# Patient Record
Sex: Male | Born: 1985 | Race: Black or African American | Hispanic: No | Marital: Single | Smoking: Current every day smoker
Health system: Southern US, Community
[De-identification: ages and names within clinical notes are randomized; demographics above are authoritative.]

## PROBLEM LIST (undated history)

## (undated) DIAGNOSIS — F329 Major depressive disorder, single episode, unspecified: Secondary | ICD-10-CM

## (undated) DIAGNOSIS — F32A Depression, unspecified: Secondary | ICD-10-CM

## (undated) HISTORY — PX: JOINT REPLACEMENT: SHX530

---
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
## 2006-12-02 ENCOUNTER — Emergency Department (HOSPITAL_COMMUNITY): Admission: EM | Admit: 2006-12-02 | Discharge: 2006-12-02 | Payer: Self-pay | Admitting: Emergency Medicine

## 2008-04-02 ENCOUNTER — Emergency Department (HOSPITAL_COMMUNITY): Admission: EM | Admit: 2008-04-02 | Discharge: 2008-04-02 | Payer: Self-pay | Admitting: Emergency Medicine

## 2008-04-06 ENCOUNTER — Emergency Department (HOSPITAL_COMMUNITY): Admission: EM | Admit: 2008-04-06 | Discharge: 2008-04-06 | Payer: Self-pay | Admitting: Emergency Medicine

## 2009-03-18 ENCOUNTER — Emergency Department (HOSPITAL_COMMUNITY): Admission: EM | Admit: 2009-03-18 | Discharge: 2009-03-18 | Payer: Self-pay | Admitting: Emergency Medicine

## 2010-05-20 ENCOUNTER — Emergency Department (HOSPITAL_COMMUNITY): Admission: EM | Admit: 2010-05-20 | Discharge: 2010-05-20 | Payer: Self-pay | Admitting: Emergency Medicine

## 2010-06-08 ENCOUNTER — Emergency Department (HOSPITAL_COMMUNITY): Admission: EM | Admit: 2010-06-08 | Discharge: 2010-06-08 | Payer: Self-pay | Admitting: Emergency Medicine

## 2010-09-24 LAB — URINALYSIS, ROUTINE W REFLEX MICROSCOPIC
Glucose, UA: NEGATIVE mg/dL
Hgb urine dipstick: NEGATIVE
Specific Gravity, Urine: 1.026 (ref 1.005–1.030)

## 2010-09-24 LAB — BASIC METABOLIC PANEL
CO2: 24 mEq/L (ref 19–32)
Calcium: 9 mg/dL (ref 8.4–10.5)
GFR calc Af Amer: >60 mL/min (ref >60)
GFR calc non Af Amer: >60 mL/min (ref >60)
Sodium: 136 mEq/L (ref 135–145)

## 2010-09-24 LAB — RAPID URINE DRUG SCREEN, HOSP PERFORMED
Amphetamines: NOT DETECTED
Barbiturates: NOT DETECTED
Benzodiazepines: NOT DETECTED
Opiates: NOT DETECTED

## 2010-09-24 LAB — DIFFERENTIAL
Lymphocytes Relative: 48 % — ABNORMAL HIGH (ref 12–46)
Lymphs Abs: 3.6 10*3/uL (ref 0.7–4.0)
Monocytes Relative: 10 % (ref 3–12)
Neutro Abs: 3 10*3/uL (ref 1.7–7.7)
Neutrophils Relative %: 39 % — ABNORMAL LOW (ref 43–77)

## 2010-09-24 LAB — CBC
Hemoglobin: 13.8 g/dL (ref 13.0–17.0)
RBC: 4.56 MIL/uL (ref 4.22–5.81)
WBC: 7.6 10*3/uL (ref 4.0–10.5)

## 2010-09-24 LAB — ETHANOL: Alcohol, Ethyl (B): <5 mg/dL (ref 0–10)

## 2010-10-18 LAB — CBC
HCT: 40.6 % (ref 39.0–52.0)
MCHC: 34.5 g/dL (ref 30.0–36.0)
MCV: 94.2 fL (ref 78.0–100.0)
Platelets: 282 10*3/uL (ref 150–400)
RBC: 4.31 MIL/uL (ref 4.22–5.81)

## 2010-10-18 LAB — RAPID URINE DRUG SCREEN, HOSP PERFORMED
Amphetamines: NOT DETECTED
Barbiturates: NOT DETECTED
Benzodiazepines: NOT DETECTED
Tetrahydrocannabinol: POSITIVE — AB

## 2010-10-18 LAB — DIFFERENTIAL
Basophils Relative: 3 % — ABNORMAL HIGH (ref 0–1)
Eosinophils Absolute: 0.2 10*3/uL (ref 0.0–0.7)
Eosinophils Relative: 3 % (ref 0–5)
Lymphs Abs: 4.3 10*3/uL — ABNORMAL HIGH (ref 0.7–4.0)
Monocytes Relative: 6 % (ref 3–12)
Neutrophils Relative %: 45 % (ref 43–77)

## 2010-10-18 LAB — POCT I-STAT, CHEM 8
Chloride: 105 mEq/L (ref 96–112)
Glucose, Bld: 96 mg/dL (ref 70–99)
HCT: 44 % (ref 39.0–52.0)
Hemoglobin: 15 g/dL (ref 13.0–17.0)
Potassium: 3.9 mEq/L (ref 3.5–5.1)
Sodium: 141 mEq/L (ref 135–145)

## 2010-10-18 LAB — URINALYSIS, ROUTINE W REFLEX MICROSCOPIC
Glucose, UA: NEGATIVE mg/dL
Hgb urine dipstick: NEGATIVE
Ketones, ur: NEGATIVE mg/dL
Protein, ur: NEGATIVE mg/dL
Urobilinogen, UA: 1 mg/dL (ref 0.0–1.0)

## 2010-10-18 LAB — ETHANOL: Alcohol, Ethyl (B): <5 mg/dL (ref 0–10)
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
## 2010-11-29 NOTE — Discharge Summary (Signed)
[place]. [facility]  Patient:    Frederick Ballard, Frederick Ballard Visit Number: [id number] MRN: [id number]          Service Type: MED Location: PEDS 6154 01 Attending Physician:  Lubertha South Dictated by:   Dorie Rank, P.A.-C. Admit Date:  08/16/2001 Discharge Date: 08/18/2001                             Discharge Summary  ADMITTING DIAGNOSIS:  Left type 3 displaced anterior tibial tubercle fracture.  DISCHARGE DIAGNOSIS:  Left type 3 displaced anterior tibial tubercle fracture.  PROCEDURES:  On August 16, 2001, the patient underwent an open reduction and internal fixation of the left anterior tibial tubercle fracture with two 4.9 cannulated screws.  Surgeon was Dr. Kerrin Champagne.  Anesthesia was general. Complications none.  CONSULTANTS:  None.  HISTORY OF PRESENT ILLNESS:  This is a 25 year old male who sustained an injury to his left knee while going up to shoot a basket and felt a pop in the knee.  He had immediate discomfort and pain.  He was seen in the emergency room where radiographs demonstrated a left anterior tibial tubercle fracture, which was displaced and an intra-articular type 3 injury.  The fracture was noted to be displaced greater than 2 cm, almost 3 cm, intra-articularly.  It was felt to represent the need for intraoperative reduction and internal fixation.  HOSPITAL COURSE:  The patient had the above-stated surgery.  He tolerated this well without complication.  While in the operating room, the incision was dressed in a sterile fashion and a Hemovac drain was placed under the incision.  On postoperative day one, the dressing was clean, dry, and intact.  The dressing was changed on postoperative day two and found to be free of any erythema or drainage.  The Hemovac drain was discontinued on postoperative day one without difficulty.  Initially, the patient utilized IV pain medications; however, he was weaned off of this prior to  discharge and he had good pain control with p.o. pain medications.  Appropriate IV antibiotics were used postoperatively for infection control.  The patient was placed on aspirin and utilized TED hose for DVT prophylaxis while in the hospital.  A regular diet was resumed postoperatively.  He had good p.o. intake while in the hospital.  Hemoglobin and hematocrit were checked on postoperative day one and found to be stable.  He was nonweightbearing to the left lower extremity. While in the operating room, an IV was placed.  This was discontinued prior to his discharge home.  Neurovascular, motor, and vital signs were checked routinely postoperatively and also found to be stable.  He went to physical therapy and occupational therapy for progressive ambulation.  He was comfortable utilizing crutches on his nonweightbearing status prior to discharge.  On August 18, 2001, he was felt to be medically and orthopedically stable for discharge.  LABORATORY DATA:  On August 16, 2001, hemoglobin 14.1 and hematocrit 40.9. On August 17, 2001, hemoglobin was 12.5 and hematocrit 36.2.  Radiology:  No reports available on the chart at the time of this dictation. Cardiology:  No EKG.  CONDITION ON DISCHARGE:  Improved.  DISCHARGE INSTRUCTIONS:  The patient was discharged home in the care of his mother, regular diet.  Given prescriptions for Percocet and Robaxin.  He was to take enteric-coated aspirin over-the-counter 1 p.o. q.d.  Daily dressing changes.  Immobilizer on at all times.  He  could shower on postoperative day five.  Follow up with Dr. Kerrin Champagne in 10 to 12 days.  He is to call for an appointment.  Continue nonweightbearing to the left lower extremity.  Call for any questions or concerns prior to his appointment with Dr. Kerrin Champagne. Dictated by:   Dorie Rank, P.A.-C. Attending Physician:  Lubertha South DD:  09/09/01 TD:  09/09/01 Job: [id number]

## 2010-11-29 NOTE — Op Note (Signed)
[name]. [facility]  Patient:    Frederick Ballard, Frederick Ballard Visit Number: [id number] MRN: [id number]          Service Type: MED Location: PEDS 6154 01 Attending Physician:  Lubertha South Dictated by:   Kerrin Champagne, M.D. Proc. Date: 08/16/01 Admit Date:  08/16/2001                             Operative Report  PREOPERATIVE DIAGNOSIS:  Left type 3 displaced anterior tibial tubercle fracture.  POSTOPERATIVE DIAGNOSIS:  Left type 3 displaced anterior tibial tubercle fracture.  PROCEDURE:  Open reduction and internal fixation of left anterior tibial tubercle fracture with two 4.0 cannulated screws.  SURGEON:  Kerrin Champagne, M.D.  ANESTHESIA:  General orotracheal, J. Claybon Jabs, M.D.  ESTIMATED BLOOD LOSS:  150 cc.  DRAINS:  Hemovac x1.  TOURNIQUET TIME:  Total tourniquet time at 300 mmHg, 1 hour 5 minutes.  BRIEF CLINICAL HISTORY:  The patient is a 25 year old male who sustained an injury to his left knee while going up to shoot a basket and felt a pop in the knee.  Immediate discomfort and pain.  Seen in the emergency room, where radiographs demonstrated a left anterior tibial tubercle fracture, which was displaced and an intra-articular type 3 injury.  The fracture has displaced greater than 2 cm, almost 3 cm intra-articularly.  It is felt to represent the need for intraoperative reduction and internal fixation.  INTRAOPERATIVE FINDINGS:  The fracture indeed was intra-articular and represented a rather large anterior tibial tubercle fragment and fracture of the anterior aspect of the proximal tibia.  It was reduced anatomically and fixed with two screws.  DESCRIPTION OF PROCEDURE:  After adequate general anesthesia, the patient transferred to the operating table.  There on the Ione frame, he was in supine position.  Tourniquet about the left upper thigh.  Prepped from the left ankle to the left upper thigh with Duraprep solution,  standard preoperative antibiotics of Ancef.  Draped in the usual manner using an arthroscope drape sheet in case the need for arthroscopy appeared.  The patients leg was elevated, exsanguinated with an Esmarch bandage, and the left lower extremity tourniquet inflated to 300 mmHg.  The incision, a medial parapatellar incision, longitudinal, extending from the midpatellar tendon line distally.  The incision was initially just to the level of the anterior tibial tubercle just below it and then later extended an additional 3-4 cm below the anterior tibial tubercle to allow for repair of soft tissue structures, periosteum, and fascial layers that were torn with the avulsion injury.  Incision carried sharply through skin and subcu layers directly down to the anterior tibial tubercle, along the medial aspect of the patellar tendon, and then taken over the lateral aspect of the patellar tendon, preserving deep flaps.  A large amount of blood was immediately encountered, and this was irrigated.  This represented hematoma from the fracture site in the intra-articular area.  The fracture was opened widely, maintaining large flaps both medial and lateral.  The fracture fragment of the tibial tubercle was reflected superiorly, the intra-articular nature of the fracture identified, irrigation was performed.  Careful inspection of the joint demonstrated no intra-articular fragments or cartilage present.  Irrigation was performed, and careful range of motion demonstrated no fragmentations within the joint.  Carefully the borders of the fracture site were freed up using a 15 blade scalpel 1-2 mm from the margin  to allow for exact anatomic reduction.  The fracture fragment was then carefully reduced after a very light, minimal curettage to remove hematoma, soft tissue from the bony structures of both the proximal tibia and the fragment fragment of the anterior tibial tubercle. With this, then, the  fragment was carefully reduced, pinned in place using a temporary pin anterior inferolateral.  C-arm fluoroscopy used to ascertain reduction of the fragment.  Then the 15 blade scalpel used to incise a small 3-4 mm longitudinal incision into the patellar tendon approximately 1.5-2 cm of the most distal end of the anterior tibial tubercle.  Then a guide pin was placed into the proximal metaphysis with soft tissue sleeve protector. Overdrill using a 2.7 drill was then performed after measuring length of 60 mg, and a 55 mm length screw was chosen.  With this, then, overdrill was performed, then tapping, and then countersink performed and then screw screwed into place.  The final threads were screwed down after removing temporary fixation pin to allow for further compression across the anterior tibial tubercle fracture site, reducing it nicely.  A second guide pin was then passed parallel to the first or nearly so, about 1.5 cm distal to the previous pin.  This was passed parallel, protecting the soft tissue structures and soft tissue sleeve, and then appropriate length measured, measured 46 mm, and after measuring for depth, drilling was performed with a 2.7 drill bit and then tapping performed and a countersink performed.  A 46 mm cancellous screw was then screwed into place and obtained excellent purchase on fragment, as did the more proximal screw.  Permanent C-arm images in AP and lateral planes were obtained at this point.  Further irrigation was then performed, and the periosteum and external retinaculum of the knee were approximated with interrupted 0 Vicryl sutures both medially and laterally.  This required extension of the incision as noted previously to allow for the anterior tibial periosteum flap to be carefully annealed back down to bone anteriorly, medially, and laterally using the 0 Vicryl sutures.  As there was quite a bit of hematoma and flaps both medial and lateral, a  Hemovac drain was placed both medially and laterally using a medium-sized standard size for total joint replacement.  This exited the left lateral aspect of the knee.  Deep subcu  layers were then approximated with interrupted 0 Vicryl sutures and the more superficial layers with interrupted 2-0 Vicryl sutures and the skin closed with stainless steel staples.  Adaptic, 4 x 4s, ABD pads then affixed to the skin with sterile Webril and a well-padded bulky Jones dressing applied from the left ankle to the left upper thigh.  Tourniquet was released, Ace wrap applied from the left foot to the left upper thigh.  Total tourniquet time was 1 hour 5 minutes.  The patient was then returned to his bed following reactivation and extubation.  Returned to the recovery room in satisfactory condition.  All instrument and sponge counts were correct. Dictated by:   Kerrin Champagne, M.D. Attending Physician:  Lubertha South DD:  08/16/01 TD:  08/17/01 Job: [id number]

## 2011-07-22 ENCOUNTER — Other Ambulatory Visit (HOSPITAL_COMMUNITY): Payer: Self-pay

## 2011-07-22 ENCOUNTER — Encounter: Payer: Self-pay | Admitting: Emergency Medicine

## 2011-07-22 ENCOUNTER — Emergency Department (HOSPITAL_COMMUNITY)
Admission: EM | Admit: 2011-07-22 | Discharge: 2011-07-23 | Disposition: A | Discharge status: Other Acute Inpatient Hospital | Payer: Self-pay | Attending: Emergency Medicine | Admitting: Emergency Medicine

## 2011-07-22 DIAGNOSIS — IMO0002 Reserved for concepts with insufficient information to code with codable children: Secondary | ICD-10-CM | POA: Insufficient documentation

## 2011-07-22 DIAGNOSIS — F17200 Nicotine dependence, unspecified, uncomplicated: Secondary | ICD-10-CM | POA: Insufficient documentation

## 2011-07-22 DIAGNOSIS — R45851 Suicidal ideations: Secondary | ICD-10-CM | POA: Insufficient documentation

## 2011-07-22 DIAGNOSIS — R443 Hallucinations, unspecified: Secondary | ICD-10-CM | POA: Insufficient documentation

## 2011-07-22 DIAGNOSIS — F411 Generalized anxiety disorder: Secondary | ICD-10-CM | POA: Insufficient documentation

## 2011-07-22 HISTORY — DX: Depression, unspecified: F32.A

## 2011-07-22 HISTORY — DX: Major depressive disorder, single episode, unspecified: F32.9

## 2011-07-22 LAB — COMPREHENSIVE METABOLIC PANEL
ALT: 22 U/L (ref 0–53)
AST: 21 U/L (ref 0–37)
Alkaline Phosphatase: 99 U/L (ref 39–117)
CO2: 21 mEq/L (ref 19–32)
Calcium: 9.6 mg/dL (ref 8.4–10.5)
GFR calc non Af Amer: >90 mL/min (ref >90)
Glucose, Bld: 101 mg/dL — ABNORMAL HIGH (ref 70–99)
Potassium: 3.5 mEq/L (ref 3.5–5.1)
Sodium: 139 mEq/L (ref 135–145)

## 2011-07-22 LAB — CBC
Hemoglobin: 14.6 g/dL (ref 13.0–17.0)
MCH: 29.8 pg (ref 26.0–34.0)
Platelets: 289 10*3/uL (ref 150–400)
RBC: 4.9 MIL/uL (ref 4.22–5.81)
WBC: 9.1 10*3/uL (ref 4.0–10.5)

## 2011-07-22 LAB — RAPID URINE DRUG SCREEN, HOSP PERFORMED
Barbiturates: NOT DETECTED
Benzodiazepines: NOT DETECTED
Cocaine: POSITIVE — AB
Tetrahydrocannabinol: POSITIVE — AB

## 2011-07-22 NOTE — ED Notes (Signed)
Family at bedside. 

## 2011-07-22 NOTE — ED Provider Notes (Signed)
Medical screening examination/treatment/procedure(s) were performed by non-physician practitioner and as supervising physician I was immediately available for consultation/collaboration.  Raeford Razor, MD 07/22/11 [phone]

## 2011-07-22 NOTE — ED Notes (Signed)
Pt. Alert and oriented. Eating dinner and visiting with a relative.  No suicidal ideation or H/I.  Calm and cooperative.

## 2011-07-22 NOTE — ED Notes (Signed)
Sitter at bedside.

## 2011-07-22 NOTE — ED Notes (Signed)
ALP bedside to speak with pt 

## 2011-07-22 NOTE — ED Notes (Signed)
Pt placed into blue scrubs with instructions, [employer] and sitter remains bedside, security notified

## 2011-07-22 NOTE — ED Provider Notes (Signed)
History     CSN: [id number]  Arrival date & time 07/22/11  0416   None     Chief Complaint  Patient presents with  . Suicidal    (Consider location/radiation/quality/duration/timing/severity/associated sxs/prior treatment) HPI Comments: Patient with pressured speech, random thought, tangential reasoning or insight [employer] for evaluation States he has had suicide attempts in the past by using a belt, trying to hang himself and refusing to eat last episode was about 3 weeks ago  The history is provided by the patient.    History reviewed. No pertinent past medical history.  Past Surgical History  Procedure Date  . Joint replacement     No family history on file.  History  Substance Use Topics  . Smoking status: Current Everyday Smoker -- 1.0 packs/day    Types: Cigarettes  . Smokeless tobacco: Not on file  . Alcohol Use: Yes      Review of Systems  Constitutional: Negative.   HENT: Negative.   Eyes: Negative.   Respiratory: Negative for cough and shortness of breath.   Cardiovascular: Negative.   Gastrointestinal: Negative for nausea and vomiting.  Musculoskeletal: Negative.   Skin: Negative.   Neurological: Negative for dizziness, speech difficulty and weakness.  Hematological: Negative.   Psychiatric/Behavioral: Positive for suicidal ideas and hallucinations. Negative for confusion, self-injury and agitation. The patient is nervous/anxious. The patient is not hyperactive.     Allergies  Invega  Home Medications   Current Outpatient Rx  Name Route Sig Dispense Refill  . QUETIAPINE FUMARATE 300 MG PO TABS Oral Take 300 mg by mouth 2 (two) times daily.      . SERTRALINE HCL 100 MG PO TABS Oral Take 100 mg by mouth daily.        BP 144/87  Pulse 88  Temp 98 F (36.7 C)  Resp 16  Wt 160 lb (72.576 kg)  SpO2 99%  Physical Exam  Constitutional: He appears well-developed and well-nourished.  HENT:  Head: Normocephalic.  Cardiovascular: Normal rate.     Pulmonary/Chest: Effort normal.  Abdominal: Soft.  Musculoskeletal: Normal range of motion.  Neurological: He is alert.  Skin: Skin is warm.  Psychiatric: His mood appears anxious. His speech is rapid and/or pressured and tangential. He is agitated and actively hallucinating. He is not aggressive. He expresses inappropriate judgment. He expresses suicidal ideation. He expresses suicidal plans.    ED Course  Procedures (including critical care time)   Labs Reviewed  CBC  COMPREHENSIVE METABOLIC PANEL  ETHANOL  URINE RAPID DRUG SCREEN (HOSP PERFORMED)   No results found.   No diagnosis found.  Patient, states he's not been taking his medications for 2-3 weeks  MDM  Suicidal        Arman Filter, NP 07/22/11 0532

## 2011-07-22 NOTE — ED Notes (Signed)
Pt wanting to know when he would be discharged. Pt informed he was waiting to see the ACT team and they would determine a treatment plan for him. Also informed he would be moved to TCU to wait. Pt verbalizes understanding and seems agreeable at this time. Delorise Shiner, NT sitter at bedside. Pt denies additional needs at this time.

## 2011-07-22 NOTE — ED Notes (Signed)
Pt alert, presents with [employer], c/o Suicidal thought, denies plan, denies HI, pt resp even unlabored, skin pwd,

## 2011-07-22 NOTE — BH Assessment (Signed)
Assessment Note   Frederick Ballard is an 25 y.o. male. Pt presents to [facility] with [employer]. EDP notes that atient presented with pressured speech, random thought, &  tangential reasoning or insight. During the assessment patient sts that he is suicidal. He has a plan to drink iodine. Pt sts, "I am a smart person and I know how to hurt myself". He explains that not going to work and getting fired would be the same as a suicide attempt because he would be killing his "livelyhood".  States he has had suicide attempts in the past by using a belt, trying to hang himself. He also refused to eat approximately 3 weeks ago hoping he would die due to lack of food. Patient is unable to contract for safety.Patient has a long history of depression and feels that a "dark cloud is constantly hanging over his head".  Patient denies HI. He however reports calling the mother of child often and threatening to harm her due to conflict.   Patient denies AVH, however during the assessment he appears paranoid. He looks around the room and makes no eye contact as if he is responding to external stimuli.   Patient has a noted history of Cocaine use. He denies current use. He reports THC and alcohol use on the "weekends". His last drink was prior to arrival and he reports drinking (2) 40oz beers.  Patient denies history of in-patient treatment. He does admit to a history of out-patient treatment with the psychiatrist at [facility]. He has also utilized their crises walk-in services and mobile crises in the past when needed. He has a upcoming appointment with a psychiatrist in [facility] 08/07/2011 to  Be evaluated for disability.     Axis I: Major Depression, Recurrent severe, Axis II: Deferred Axis III:  Past Medical History  Diagnosis Date  . Depression    Axis IV: economic problems, housing problems, occupational problems, other psychosocial or environmental problems, problems related to social environment, problems with  access to health care services and problems with primary support group Axis V: 31-40 impairment in reality testing  Past Medical History:  Past Medical History  Diagnosis Date  . Depression     Past Surgical History  Procedure Date  . Joint replacement     Family History: No family history on file.  Social History:  reports that he has been smoking Cigarettes.  He has been smoking about 1 pack per day. He does not have any smokeless tobacco history on file. He reports that he drinks alcohol. He reports that he uses illicit drugs (Cocaine and Marijuana).  Additional Social History:  Alcohol / Drug Use Pain Medications: patient denies  Prescriptions: Patient denies current prescription meds. He reports takings Zoloft and Seroquel in the past-last 6 months. Over the Counter: patient denies History of alcohol / drug use?: Yes (pt admits to current THC and alcohol;noted hx of cocaine use) Longest period of sobriety (when/how long): unknown Substance #1 Name of Substance 1: Alcohol (beer and liqour) 1 - Age of First Use: Pt sts, "I don't remember" 1 - Amount (size/oz): 2 beers 1 - Frequency: weekends/"social" 1 - Duration: "several yrs" 1 - Last Use / Amount: before arriaval to [facility] 07/21/2010 Substance #2 Name of Substance 2: THC 2 - Age of First Use: 15 2 - Amount (size/oz): varies 2 - Frequency: daily 2 - Duration: on-going since age 49 2 - Last Use / Amount: "over the past weekend" Substance #3 Name of Substance 3:  Pt has a noted hx of Cocaine use, however; did not admit to use during the assessment 3 - Age of First Use: n/a 3 - Amount (size/oz): n/a 3 - Frequency: n/a 3 - Duration: n/a 3 - Last Use / Amount: n/a Allergies:  Allergies  Allergen Reactions  . Invega (Paliperidone)     Home Medications:  No current facility-administered medications on file as of 07/22/2011.   No current outpatient prescriptions on file as of 07/22/2011.    OB/GYN Status:  No LMP for male  patient.  General Assessment Data Location of Assessment: [facility] ED ACT Assessment:  (No) Living Arrangements: Parent;Other (Comment) (lives with mother) Can pt return to current living arrangement?: Yes Admission Status: Voluntary Is patient capable of signing voluntary admission?: Yes Transfer from: Home (transfer from home; sts he was picked up by GPD ) Referral Source: Self/Family/Friend  Education Status Is patient currently in school?: No  Risk to self Suicidal Ideation: Yes-Currently Present Suicidal Intent: Yes-Currently Present Is patient at risk for suicide?: Yes Suicidal Plan?: Yes-Currently Present Specify Current Suicidal Plan:  ("I will drink iodine") Access to Means: Yes (pt sts that he will do research to figure how to obtain mean) Specify Access to Suicidal Means:  (pt has no current access but sts he will do research ) What has been your use of drugs/alcohol within the last 12 months?:  (pt reports occas. alcohol and THC use) Previous Attempts/Gestures: Yes How many times?:  (1x (summer 2012)) Other Self Harm Risks:  (n/a) Triggers for Past Attempts: Other (Comment) (Pt sts, "It was the dark cloud") Intentional Self Injurious Behavior: None Family Suicide History:  (Father has mental illness and cousin comitted suicide) Recent stressful life event(s): Conflict (Comment) ("drama with childs mother") Persecutory voices/beliefs?: No Depression: Yes Depression Symptoms: Despondent;Isolating;Fatigue;Loss of interest in usual pleasures;Feeling worthless/self pity Substance abuse history and/or treatment for substance abuse?:  (no treatment history of substance abuse) Suicide prevention information given to non-admitted patients: Not applicable  Risk to Others Homicidal Ideation: No-Not Currently/Within Last 6 Months (pt denies, however; sts that he threatened mother of child ) Thoughts of Harm to Others:  (previous thoughts to harm mother of child in the past) Current  Homicidal Intent: No Current Homicidal Plan: No Access to Homicidal Means: No Identified Victim:  (n/a) History of harm to others?: No Assessment of Violence: None Noted (pt has remained calm/cooperative here in the ED) Violent Behavior Description:  (n/a) Does patient have access to weapons?: No Criminal Charges Pending?: No Does patient have a court date: No  Psychosis Hallucinations: None noted (denies,however;suspected paranoia or response to external st) Delusions: Unspecified (pt appears paranoid, no eye contact, respond. to ext. stimul)  Mental Status Report Appear/Hygiene: Other (Comment) (disheveled) Eye Contact: Poor Motor Activity: Other (Comment) (normal) Speech: Logical/coherent Level of Consciousness: Alert Mood: Depressed;Preoccupied Affect: Preoccupied;Depressed Anxiety Level: Minimal Judgement: Impaired Orientation: Person;Place;Time;Situation Obsessive Compulsive Thoughts/Behaviors: None  Cognitive Functioning Concentration: Decreased Memory: Recent Intact;Remote Intact IQ: Average Insight: Poor Impulse Control: Fair Appetite: Good Weight Loss:  (n/a) Weight Gain:  (n/a) Sleep: Decreased Total Hours of Sleep:  (5-8 hrs ) Vegetative Symptoms: None  Prior Inpatient Therapy Prior Inpatient Therapy: No Prior Therapy Dates:  (n/a) Prior Therapy Facilty/Provider(s):  (n/a) Reason for Treatment:  (n/a)  Prior Outpatient Therapy Prior Outpatient Therapy: Yes Prior Therapy Dates:  (past/last seen 6 months ago) Prior Therapy Facilty/Provider(s):  [facility]) Reason for Treatment:  (med. mgt, and mental health crises, SI, substance abuse)  ADL Screening (condition  at time of admission) Patient's cognitive ability adequate to safely complete daily activities?: Yes Patient able to express need for assistance with ADLs?: Yes Independently performs ADLs?: Yes Weakness of Legs: None Weakness of Arms/Hands: None  Home Assistive Devices/Equipment Home  Assistive Devices/Equipment: None    Abuse/Neglect Assessment (Assessment to be complete while patient is alone) Physical Abuse: Denies Verbal Abuse: Denies Sexual Abuse: Denies Exploitation of patient/patient's resources: Denies Self-Neglect: Denies Values / Beliefs Cultural Requests During Hospitalization: None Spiritual Requests During Hospitalization: None   Advance Directives (For Healthcare) Advance Directive: Patient does not have advance directive Nutrition Screen Diet: Regular Unintentional weight loss greater than 10lbs within the last month: No Dysphagia: No Home Tube Feeding or Total Parenteral Nutrition (TPN): No Patient appears severely malnourished: No Pregnant or Lactating: No  Additional Information 1:1 In Past 12 Months?: No CIRT Risk: No Elopement Risk: No Does patient have medical clearance?: Yes     Disposition:  Disposition Disposition of Patient: Referred to;Inpatient treatment program [facility] for in--pt treatment) Type of inpatient treatment program: Adult  On Site Evaluation by:   Reviewed with Physician:     Melynda Ripple [facility] 07/22/2011 10:39 AM

## 2011-07-22 NOTE — ED Notes (Signed)
EAV:[id number]<XB> Expected date:<BR> Expected time:<BR> Means of arrival:<BR> Comments:<BR> bch

## 2011-07-23 ENCOUNTER — Encounter (HOSPITAL_COMMUNITY): Payer: Self-pay | Admitting: *Deleted

## 2011-07-23 ENCOUNTER — Inpatient Hospital Stay (HOSPITAL_COMMUNITY)
Admission: AD | Admit: 2011-07-23 | Discharge: 2011-07-27 | DRG: 885 | Disposition: A | Discharge status: Home / Self Care | Payer: No Typology Code available for payment source | Source: Ambulatory Visit | Attending: Psychiatry | Admitting: Psychiatry

## 2011-07-23 DIAGNOSIS — F1210 Cannabis abuse, uncomplicated: Secondary | ICD-10-CM

## 2011-07-23 DIAGNOSIS — Z888 Allergy status to other drugs, medicaments and biological substances status: Secondary | ICD-10-CM

## 2011-07-23 DIAGNOSIS — F259 Schizoaffective disorder, unspecified: Principal | ICD-10-CM

## 2011-07-23 DIAGNOSIS — R45851 Suicidal ideations: Secondary | ICD-10-CM

## 2011-07-23 DIAGNOSIS — F329 Major depressive disorder, single episode, unspecified: Secondary | ICD-10-CM

## 2011-07-23 DIAGNOSIS — F1410 Cocaine abuse, uncomplicated: Secondary | ICD-10-CM

## 2011-07-23 DIAGNOSIS — F3289 Other specified depressive episodes: Secondary | ICD-10-CM

## 2011-07-23 DIAGNOSIS — F1910 Other psychoactive substance abuse, uncomplicated: Secondary | ICD-10-CM | POA: Diagnosis present

## 2011-07-23 DIAGNOSIS — F251 Schizoaffective disorder, depressive type: Secondary | ICD-10-CM | POA: Diagnosis present

## 2011-07-23 DIAGNOSIS — Z79899 Other long term (current) drug therapy: Secondary | ICD-10-CM

## 2011-07-23 MED ORDER — NICOTINE 21 MG/24HR TD PT24
MEDICATED_PATCH | TRANSDERMAL | Status: AC
  Filled 2011-07-23: qty 1

## 2011-07-23 MED ORDER — NICOTINE 21 MG/24HR TD PT24
21.0000 mg | MEDICATED_PATCH | Freq: Once | TRANSDERMAL | Status: DC
  Administered 2011-07-23: 21 mg via TRANSDERMAL

## 2011-07-23 MED ORDER — ALUM & MAG HYDROXIDE-SIMETH 200-200-20 MG/5ML PO SUSP: 30.0000 mL | ORAL | Status: DC | PRN

## 2011-07-23 MED ORDER — ACETAMINOPHEN 325 MG PO TABS: 650.0000 mg | ORAL_TABLET | Freq: Four times a day (QID) | ORAL | Status: DC | PRN

## 2011-07-23 MED ORDER — QUETIAPINE FUMARATE 400 MG PO TABS
400.0000 mg | ORAL_TABLET | Freq: Every day | ORAL | Status: DC
  Administered 2011-07-23 – 2011-07-24 (×2): 400 mg via ORAL
  Filled 2011-07-23 (×3): qty 1

## 2011-07-23 MED ORDER — MAGNESIUM HYDROXIDE 400 MG/5ML PO SUSP: 30.0000 mL | Freq: Every day | ORAL | Status: DC | PRN

## 2011-07-23 NOTE — ED Notes (Signed)
Pt has been accepted to [facility] by Dr. Allena Katz to bed 400-2. EDP has been notified and is in agreement with disposition. RN made aware to call report. All support paperwork has been completed and faxed to [facility]. No further needs identified at this time.

## 2011-07-23 NOTE — ED Notes (Signed)
Pt heart rate 56 manual.  BP stable.  Notified md.  Pt asymptomatic.  Walking to bathroom with no assist.  Continue to monitor.

## 2011-07-23 NOTE — Progress Notes (Signed)
Patient ID: Frederick Ballard, male   DOB: 1986/05/22, 26 y.o.   MRN: [id number] Pt. Is a 26 y.o. Male admitted with SI, but currently contracts for safety. Pt. Stressors are death of an aunt who was funeralized on yesterday. Pt.  Tested positive for cocaine and THC. Pt. Reports occasional use of these drugs as well as ETOH. Pt. Reports weekend use says "that's my time." Pt. Seems to minimize use of substances. Pt. Works for [employer] in [employer]. Pt. Pays child support for an 58 year old daughter. Pt. Really concerned during this admission about his job and returning to it so he can keep up the child support, because of this concern pt. Already talking about discharge. He lives with his mother.  Pt. Denies AVH. Pt. Offered food and drink, but declined. Pt. Oriented to unit/room. Staff will continue to monitor q23min for safety.

## 2011-07-23 NOTE — ED Notes (Signed)
Patient denies pain and is resting comfortably.  

## 2011-07-23 NOTE — Tx Team (Signed)
Initial Interdisciplinary Treatment Plan  PATIENT STRENGTHS: (choose at least two) Ability for insight Average or above average intelligence Communication skills Financial means General fund of knowledge Physical Health Supportive family/friends  PATIENT STRESSORS: Medication change or noncompliance Substance abuse Traumatic event   PROBLEM LIST: Problem List/Patient Goals Date to be addressed Date deferred Reason deferred Estimated date of resolution  SI      Depression      Substance abuse                                           DISCHARGE CRITERIA:  Improved stabilization in mood, thinking, and/or behavior Motivation to continue treatment in a less acute level of care Need for constant or close observation no longer present Reduction of life-threatening or endangering symptoms to within safe limits Safe-care adequate arrangements made Verbal commitment to aftercare and medication compliance  PRELIMINARY DISCHARGE PLAN:   PATIENT/FAMIILY INVOLVEMENT: This treatment plan has been presented to and reviewed with the patient, ABEM SHADDIX, and/or family member.  The patient and family have been given the opportunity to ask questions and make suggestions.  Mickeal Needy 07/23/2011, 10:13 PM

## 2011-07-23 NOTE — ED Notes (Signed)
Sitter remains in eyesight of pt. Pt resting with no distress noted. Has remained calm and cooperative.

## 2011-07-23 NOTE — ED Notes (Signed)
Patient is resting comfortably. 

## 2011-07-23 NOTE — ED Notes (Signed)
RN NOTIFIED OF PATIENT'S HEART RATE

## 2011-07-23 NOTE — ED Notes (Signed)
RN NOTIFIED OF HEART RATE

## 2011-07-23 NOTE — Progress Notes (Signed)
Pt's belongings are locked in locker # 1. Pt's belongings consisted of cigarettes (open pack), watch, wallet, CD, one earring, and cell phone.

## 2011-07-23 NOTE — ED Notes (Signed)
Vital signs stable. 

## 2011-07-24 DIAGNOSIS — F251 Schizoaffective disorder, depressive type: Secondary | ICD-10-CM | POA: Diagnosis present

## 2011-07-24 DIAGNOSIS — F259 Schizoaffective disorder, unspecified: Principal | ICD-10-CM

## 2011-07-24 DIAGNOSIS — F1910 Other psychoactive substance abuse, uncomplicated: Secondary | ICD-10-CM | POA: Diagnosis present

## 2011-07-24 MED ORDER — SERTRALINE HCL 100 MG PO TABS
100.0000 mg | ORAL_TABLET | Freq: Every day | ORAL | Status: DC
  Administered 2011-07-24 – 2011-07-27 (×4): 100 mg via ORAL
  Filled 2011-07-24 (×5): qty 1
  Filled 2011-07-24 (×2): qty 14
  Filled 2011-07-24: qty 1

## 2011-07-24 NOTE — Progress Notes (Signed)
[facility] Group Notes:  (Counselor/Nursing/MHT/Case Management/Adjunct)  07/24/2011 3:53 PM  Type of Therapy:  Group Therapy  Participation Level:  Minimal  Participation Quality:  Attentive  Affect:  Depressed  Cognitive:  Oriented    Engagement in Group:  Limited  Engagement in Therapy:  Limited  Modes of Intervention:  Education and Support  Summary of Progress/Problems: Patient was generally quiet during group. He was taken out of group to talk to the doctor.   HartisAram Beecham 07/24/2011, 3:53 PM

## 2011-07-24 NOTE — Progress Notes (Signed)
Patient out of bed and in the milieu today.  Not interacting much, but attending and participating in groups.  Affect blunted.  Patient admits to continued depression and anxiety, but denies suicidal ideation.  Going to meals, states appetite is good.  Pleasant and cooperative with staff.

## 2011-07-24 NOTE — Tx Team (Signed)
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:   Patient understands diagnosis:    Discussing patient identified problems/goals with staff:    Medical problems stabilized or resolved:    Denies suicidal/homicidal ideation:   Issues/concerns per patient self-inventory:    Other:  New problem(s) identified: No, Describe:    Reason for Continuation of Hospitalization: Depression Medication stabilization Other; describe negative thinking  Interventions implemented related to continuation of hospitalization:  Medication monitoring and adjustment, safety checks Q15 min., group therapy, psychoeducation, collateral contact  Additional comments:  Not applicable  Estimated length of stay:  1-3 days  Discharge Plan:  Go home to live with mother, follow up with [facility]  New goal(s):  Not applicable  Review of initial/current patient goals per problem list:   1.  Goal(s):  Reduce depression from 10 to 3.  Met:  No  Target date:  By Discharge   As evidenced by:  Like a "pot of boiling water, comes in waves"  2.  Goal(s):  Deny SI for 48 hours prior to D/C.  Met:  No  Target date:  By Discharge   As evidenced by:  Denies today, but has strong history  3.  Goal(s):  Determine if / how to address substance abuse issues.  Met:  No  Target date:  By Discharge   As evidenced by:  Willing to consider rehab, but right now concerned about needing to be at work.  4.  Goal(s):  Reestablish medication regimen, stabilize medications.  Met:  No  Target date:  By Discharge   As evidenced by:  In process of evaluating  Attendees: Patient:  Frederick Ballard  07/24/2011 11:16 AM   Family:     Physician:  Dr.  Harvie Heck Readling 07/24/2011 11:16 AM   Nursing:   Izola Price, RN 07/24/2011   11:16 AM   Case Manager:  Ambrose Mantle, LCSW 07/24/2011  11:16 AM   Counselor:  Veto Kemps, MT-BC 07/24/2011 11:16 AM    Other:      Other:      Other:      Other:       Scribe for Treatment Team:   Sarina Ser, 07/24/2011, 10:09 AM

## 2011-07-24 NOTE — Discharge Planning (Signed)
Met with patient in Aftercare Planning Group.   He expressed concern about missing work today and tomorrow.  He had already called his supervisor about missing today, but consented to assistance from Case Manager to make supervisor aware about tomorrow.  After he signed consent for Kathlene November [phone], Case Manager did call supervisor.  He was concerned about patient who is his employee, and will look forward to him returning to work on Monday.  Stated that the patient is a "very good young man".  Case Manager conveyed conversation to patient, who was appreciative of the assistance.   A letter will be needed for his return to work.  Ambrose Mantle, LCSW 07/24/2011, 1:51 PM

## 2011-07-24 NOTE — Progress Notes (Signed)
Suicide Risk Assessment  Admission Assessment     Demographic factors:  Assessment Details Time of Assessment: Admission Information Obtained From: Patient Current Mental Status:  Current Mental Status: Suicidal ideation indicated by patient Loss Factors:  Loss Factors: Loss of significant relationship (aunt died yesterday) Historical Factors:    Risk Reduction Factors:  Risk Reduction Factors: Responsible for children under 26 years of age;Sense of responsibility to family;Employed;Living with another person, especially a relative  CLINICAL FACTORS:   Depression:   Anhedonia Comorbid alcohol abuse/dependence Insomnia Alcohol/Substance Abuse/Dependencies More than one psychiatric diagnosis Previous Psychiatric Diagnoses and Treatments  COGNITIVE FEATURES THAT CONTRIBUTE TO RISK:  None Noted.  Diagnosis:  Axis I:  Schizoaffective Disorder - Depressed Type.  The patient was seen today and reports the following:   Sleep: The patient reports to sleeping good last night with the medication Seroquel.  Appetite: Good.   Mild>(1-10) >Severe  Hopelessness (1-10): 0  Depression (1-10): 5 (varies depending on the situation.)  Anxiety (1-10): 3   Suicidal Ideation: The patient denies any suicidal ideations today.  Plan: No  Intent: No  Means: No   Homicidal Ideation: The patient adamantly denies any homicidal ideations today.  Plan: No  Intent: No.  Means: No   Eye Contact: Fair.  General Appearance/Behavior: Neat and Casual in no apparent distress. Motor Behavior: Slightly slowed.  Speech: Appropriate in rate and volume with no pressuring noted..  Mental Status: Alert and Oriented x 3  Level of Consciousness: Alert  Mood: Moderately depressed.  Affect: Moderately Constricted.  Anxiety: Mild.  Thought Process: WNL  Thought Content: The patient denies auditory or visual hallucinations. He states in the past he has experienced "negative thoughts" but with none today.  He  denies delusional thoughts. Perception: wnl.  Judgment: Fair to Good.  Insight: Fair to Good.  Cognition: Orientation time, place and person.   The patient does state today that he is hoping to be discharged by Monday where he can return to his work.  Treatment Plan Summary:  1. Daily contact with patient to assess and evaluate symptoms and progress in treatment  2. Medication management  3. The patient will deny suicidal ideations or homicidal ideations for 48 hours prior to discharge and have a depression and anxiety rating of 3 or less. The patient will also deny any auditory or visual hallucinations or delusional thinking or display any manic or hypomanic behaviors.   Plan:  1. Will restart Zoloft at 100 mgs po q am for depression. 2. Will restart Seroquel at 400 mgs po qhs for "negative thoughts." 3. Will continue to monitor. 4. Will order a TSH, Free T3 and Free T4.  SUICIDE RISK:   Mild:  Suicidal ideation of limited frequency, intensity, duration, and specificity.  There are no identifiable plans, no associated intent, mild dysphoria and related symptoms, good self-control (both objective and subjective assessment), few other risk factors, and identifiable protective factors, including available and accessible social support.  Eligha Kmetz 07/24/2011, 1:18 PM

## 2011-07-24 NOTE — Progress Notes (Signed)
Patient ID: Frederick Ballard, male   DOB: 01/24/1986, 26 y.o.   MRN: [id number] Pt. Eyes closed, resp. Even. No distress noted. Staff will monitor q72min for safey. Pt. Is safe.

## 2011-07-24 NOTE — H&P (Signed)
    Identifying information: This is a 26 year old single [place] American male, this is a voluntary admission.  History of present illness: Linear presents with an exacerbation of his depression in the wake of his aunt's death. He reports that he has never been close to anyone has died before and this was a new experience for him. He presented expressing suicidal thoughts with a plan to possibly overdose on iodine. He reports that he has attempted suicide in the past by attempting to hang himself. He describes his depression as coming in waves a pot of boiling water that occasionally boils over. Today he is in full contact with reality and denies any suicidal thoughts plans or intent. Today he reports his hopelessness is a 0 and his depression is 0. This is on a 1-10 scale of 10 is the worst symptoms.  He agrees that he needs substance abuse treatment, he has been using cocaine on an intermittent basis, and drinking alcohol most weekends up to 2, 40 ounce beers on weekend days. He has been drinking alcohol pretty regularly like this for the past 2 years. He thinks he can benefit from substance abuse treatment.  Past psychiatric history:  Previously followed at [facility] mental health. He is taking the Zoloft 100 mg daily in the past and was on Seroquel 300 mg twice a day. He reports not taking medications in 6 months.   Social history:.   This is a single [place] American male. He works as a Copy at [employer]. He enjoys his work. He has no legal problems.   Medical evaluation:   Full physical exam and medical clearance were performed in the [facility] long emergency room. I have personally examined this patient and my findings are consistent with those of the emergency room. This is a normally developed [place] American male with no abnormal movements, adequately nourished and hydrated. With normal vital signs.  Diagnostic studies revealed urine drug screen positive for cannabis and  cocaine metabolites. CBC and metabolic panel were normal. He denies chronic medical problems.   Admitting medications: None  Drug allergies are Invega.  Admitting mental status exam:  Fully alert male pleasant, cooperative, in full contact with reality. He is fully oriented to person and situation. Hygiene and dress are appropriate. Eye contact is solid. Affect blunted. He denies any dangerous ideas today. Thinking is goal oriented and he is asking to be discharged tomorrow morning so we can be to work by 7:30 AM. He does not appear internally distracted. Intelligence average. Insight superficial. Judgment fair to poor. Impulse control normal.  Admitting diagnosis: Schizoaffective disorder, depressed type, cocaine abuse, cannabis abuse.  Plan: We will restart his Zoloft 100 mg daily. We will restart Seroquel at 400 mg by mouth each bedtime, to address "negative thoughts". 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

## 2011-07-24 NOTE — Progress Notes (Signed)
[facility] Group Notes:  (Counselor/Nursing/MHT/Case Management/Adjunct)  07/24/2011 3:55 PM  Type of Therapy:  Music Therapy  Participation Level:  Active  Participation Quality:  Attentive and Sharing  Affect:  Depressed  Cognitive:  Oriented  Insight:  Limited  Engagement in Group:  Limited  Engagement in Therapy:  Limited  Modes of Intervention:  Activity, Education and Support  Summary of Progress/Problems: Patient participated in relaxation music activity. He did not participate in discussion of the positive effects of music. Talked about having quiet times when he gets home from work.   Jacklyne Baik, Aram Beecham 07/24/2011, 3:55 PM

## 2011-07-25 DIAGNOSIS — R45851 Suicidal ideations: Secondary | ICD-10-CM

## 2011-07-25 DIAGNOSIS — F329 Major depressive disorder, single episode, unspecified: Secondary | ICD-10-CM

## 2011-07-25 LAB — T3, FREE: T3, Free: 2.9 pg/mL (ref 2.3–4.2)

## 2011-07-25 LAB — T4, FREE: Free T4: 0.99 ng/dL (ref 0.80–1.80)

## 2011-07-25 MED ORDER — QUETIAPINE FUMARATE 50 MG PO TABS
150.0000 mg | ORAL_TABLET | Freq: Every day | ORAL | Status: DC
  Administered 2011-07-25 – 2011-07-26 (×2): 150 mg via ORAL
  Filled 2011-07-25: qty 3
  Filled 2011-07-25 (×2): qty 42
  Filled 2011-07-25 (×2): qty 3

## 2011-07-25 MED ORDER — ALUM & MAG HYDROXIDE-SIMETH 200-200-20 MG/5ML PO SUSP: 30.0000 mL | ORAL | Status: DC | PRN

## 2011-07-25 MED ORDER — MAGNESIUM HYDROXIDE 400 MG/5ML PO SUSP: 30.0000 mL | Freq: Every day | ORAL | Status: DC | PRN

## 2011-07-25 NOTE — Progress Notes (Signed)
[facility] Group Notes:  (Counselor/Nursing/MHT/Case Management/Adjunct)  07/25/2011 12:08 PM  Type of Therapy:  group therapy  Participation Level:  Minimal  Participation Quality:  Attentive and Sharing  Affect:  Depressed  Cognitive:  Oriented  Insight:  Limited  Engagement in Group:  Limited  Engagement in Therapy:  Limited  Modes of Intervention:  Education, Problem-solving and Support  Summary of Progress/Problems: Patient was quiet and did not initiate discussion. He responded to counselor's questions about being at home with mom as saying there were no problems because he paid the rent and helped out.    Tamirah George, Aram Beecham 07/25/2011, 12:08 PM

## 2011-07-25 NOTE — Progress Notes (Signed)
Adult Services Patient-Family Contact/Session  Attendees:  Patient's mother, Naseer Hearn ([phone])  Goal(s):  Collateral and Discharge planning   Safety Concerns:  Concerned about his drug use and that he might no something just because he doesn't care. Feels he is using to self-medicate  Narrative:  Mother concerned because she thinks patient is lonely. She stated that he has a difficult time relating to other people. She had no problems with him when he was growing up. He showed signs of deteriorating at age 62 or 73 when he gave up his scholarship for college, lost focus, decreased ambition, hygiene and thinking. She believes he is self medicating with cocaine and alcohol. It's harder to deal with him since he is an adult and has been harder to get him services since an adult. She stated that he is very quiet and has difficulty relating unless he is drinking. She stated that he describes himself in his writing as someone on the outside looking in, or as if he is an alien. He often looks at how people are looking at him. She compared it to someone with Asperger's. They are in the process of trying to get him disability even though she knows patient doesn't like that and would rather work and live independently. Patient's father is currently in an institution in [place] for mental illness. Mother was concerned about his job but counselor read the note from case manager where boss has already been contacted and they are willing for him to return to work. Discussed aftercare with mother to consist of treatment at [facility]: Lambert Rhonda W. Also told her that substance abuse treatment had been suggested to patient but he didn't want to miss work. This would be an option if things persist. She stated that he has not been keeping appointments because he didn't like the medications because they made him sleepy at work or it was hard for him to get up in the mornings. She stated that she has talked to him that he can not  continue the drug and alcohol use and live at her home because she also has a grandchild in the home.   Barrier(s):  None    Interventions: Discussed discharge planning   Recommendation(s):  Outpatient follow-up to include support groups and SA as well as addressing depression.  Follow-up Required:  No  Explanation:    Veto Kemps 07/25/2011, 12:51 PM

## 2011-07-25 NOTE — Progress Notes (Signed)
Patient ID: NERI SAMEK, male   DOB: 06-Oct-1985, 26 y.o.   MRN: [id number]  Wilian is fully alert this afternoon.  He was drowsy this morning after receiving 400mg  Seroquel at HS.  He reports he was previously presrcibed a 300mg  Seroquel tab and would take 1/2 of it every bedtime.  And would take 1/2 of a tab during the day whenever he felt he needed it.  This worked well for him.   He denies any auditory hallucinations and denies all suicidal thoughts.  He rates his depression a 5/10 on a 1-10 scale if 10 is the worst symptoms.  He says he has no anxiety and no hopelessness.  He is still hoping for discharge on Sunday so he can get to his job on Monday at 7:30am.    He has been developing a 'habit' of using alcohol on the weekends, and sometimes cocaine too, but not regularly..this makes him feel like he is 'living the life' on the weekends - after having worked all week.  But now he thinks it is better to pursue sobriety because then he would have a clear head - and a clear head is better than getting some of these dangerous thoughts.    P:  Will decrease Seroquel to 150mg  q hs.

## 2011-07-25 NOTE — Progress Notes (Addendum)
Patient ID: Frederick Ballard, male   DOB: October 26, 1985, 26 y.o.   MRN: [id number] Nursing  [name]  Has been quiet, isolative...subdued today. He says he is afraid...that he wants to go home and he shared with this writer that he is afraid he'll " never" get to go home. This nurse offered pos feedback about him being compliant with his meds and he seemed to understand.Marland KitchenMarland KitchenHe was not compliant in completing his self inventory sheet .  A He contracted for safety, denied SI, HI and/or presence of aud, vis, tactile halluc. R Safety is maintained and POC includes maintaining therapeutic relationship already established PD RN [facility]

## 2011-07-25 NOTE — Progress Notes (Signed)
Frederick Ballard [facility] Adult Inpatient Family/Significant Other Suicide Prevention Education  Suicide Prevention Education:  Education Completed; Frederick Ballard (mother) [phone] work) has been identified by the patient as the family member/significant other with whom the patient will be residing, and identified as the person(s) who will aid the patient in the event of a mental health crisis (suicidal ideations/suicide attempt).  With written consent from the patient, the family member/significant other has been provided the following suicide prevention education, prior to the and/or following the discharge of the patient.  The suicide prevention education provided includes the following:  Suicide risk factors  Suicide prevention and interventions  National Suicide Hotline telephone number  [facility] assessment telephone number  [facility] Emergency Assistance 911  [facility] and/or Residential Mobile Crisis Unit telephone number  Request made of family/significant other to:  Remove weapons (e.g., guns, rifles, knives), all items previously/currently identified as safety concern.    Remove drugs/medications (over-the-counter, prescriptions, illicit drugs), all items previously/currently identified as a safety concern.  The family member/significant other verbalizes understanding of the suicide prevention education information provided.  The family member/significant other agrees to remove the items of safety concern listed above.  Frederick Ballard 07/25/2011, 12:50 PM

## 2011-07-25 NOTE — Progress Notes (Signed)
Patient ID: Frederick Ballard, male   DOB: 11-29-1985, 26 y.o.   MRN: [id number] Pt. Still expressing some concern about discharge date. "I need to get back to work,so I can take care of my daughter". Pt. Said he talked to his supervisor at the job and he's looking for him back. Pt. Denies AVH, SHI. Pt. Goes to [employer]. Pt. Had expressed some concerns about taking such a high dosage of Seroquel says when he got up to bathroom last night he couldn't hardly make it to the bathroom. Pt. Seem to thing taking a lower dose would be just as effective and less groggy. Pt. Encouraged to speak with physician. Writer will leave sticky note for physician. Staff will continue to monitor q28min for safety.

## 2011-07-25 NOTE — Progress Notes (Signed)
[facility] Adult Inpatient Family/Significant Other Suicide Prevention Education  Suicide Prevention Education:  Contact Attempts: Alcario Drought (mother) [phone]) and grandmother Claretha Cooper [phone]) have been identified by the patient as the family member/significant other with whom the patient will be residing, and identified as the person(s) who will aid the patient in the event of a mental health crisis.  With written consent from the patient, two attempts were made to provide suicide prevention education, prior to and/or following the patient's discharge.  We were unsuccessful in providing suicide prevention education.  A suicide education pamphlet was given to the patient to share with family/significant other.  Date and time of first attempt: 07/24/2011- Mother 8:50 Date and time of second attempt: 07/24/2011 Grandmother 8:50 am  Frederick Ballard 07/25/2011, 8:54 AM

## 2011-07-25 NOTE — Discharge Planning (Signed)
Met with patient in Aftercare Planning Group.   He was extremely quiet, lacked eye contact today.  Stated he slept through breakfast, feels his medication is overly sedating him in the mornings.  Case Manager arranged an aftercare appointment for him at [facility] on 08/01/11 at 2:30 with Dr. Ladona Ridgel.  Placed a bus pass in his shadow chart to be given to him at discharge, likely to be over the weekend, since his mother works weekends and cannot pick him up.  Patient did ask that his chart from this admission be sent to [employer] for use in his disability application.  Case Manager completed Release of Information for this.  Since discharge is likely to be over the weekend, Case Manager left 2 letters for return to work, one dated Saturday and one Sunday.  During Aftercare Planning Group, Case Manager provided psychoeducation on "Suicide Prevention Information."  This included descriptions of risk factors for suicide, warning signs that an individual is in crisis and thinking of suicide, and what to do if this occurs.  Pt indicated understanding of information provided, and will read brochure given upon discharge.     No other case management needs today.  Ambrose Mantle, LCSW 07/25/2011, 1:48 PM

## 2011-07-26 NOTE — Progress Notes (Addendum)
Patient ID: Frederick Ballard, male   DOB: 07/29/1985, 26 y.o.   MRN: [id number]  [facility] Group Notes:  (Counselor/Nursing/MHT/Case Management/Adjunct)  07/26/2011 11 AM  Type of Therapy:  Group Therapy, Dance/Movement Therapy   Participation Level:  Minimal  Participation Quality:  Drowsy and Supportive  Affect:  Depressed  Cognitive:  Oriented  Insight:  Limited  Engagement in Group:  Limited  Engagement in Therapy:  Limited  Modes of Intervention:  Clarification, Problem-solving, Role-play, Socialization and Support  Summary of Progress/Problems:pt participated in a group discussion on feelings. Pt stated that they felt like the color "clear" for everyone can see through him. he stated this was postive for he has "nothing to hide" and likes showing who he is to others. Gevena Mart

## 2011-07-26 NOTE — Progress Notes (Signed)
[facility] Group Notes:  (Counselor/Nursing/MHT/Case Management/Adjunct)  07/26/2011 4:13 PM  Type of Therapy:  After Care Planning Group  Pt. attedned after care planning group and was given [place] SI information and crisis and hotline numbers . Pt. Agreed to use them if needed.    Frederick Ballard [place] 07/26/2011, 4:13 PM

## 2011-07-26 NOTE — Progress Notes (Signed)
Patient ID: Frederick Ballard, male   DOB: April 17, 1986, 26 y.o.   MRN: [id number] The patient is pleasant and socialized appropriately in the milieu, spending most of the evening watching a basketball game. He is compliant with taking his medication. Stated that he requested the doctor cut his Seroquel dose in half, as it was making him too drowsy throughout the day. He was told that his Seroquel dose was lowered to 150 mg at bedtime. Denies any suicidal ideation. Denies any A/V hallucinations.

## 2011-07-26 NOTE — Progress Notes (Signed)
Patient ID: Frederick Ballard, male   DOB: 1986/06/04, 27 y.o.   MRN: [id number] 07-26-11 nursing shift note: pt has taken meds and gone to groups today. On his inventory sheet he wrote slept well, appetite good,  Attention good, energy normal with depression and hopelessness rated at 1.  W/d symptom is agitation. Denies any thoughts of suicide or hurting himself. Physical problems has been lightheaded. No pain. When he goes home his plan is to "stop feeling sorry for himself".  He doesn't see any problem staying on medications after discharge.  RN will monitor and safety checks continue every 15 minutes.

## 2011-07-27 MED ORDER — SERTRALINE HCL 100 MG PO TABS: 100.0000 mg | ORAL_TABLET | Freq: Every day | ORAL | Status: DC

## 2011-07-27 MED ORDER — QUETIAPINE FUMARATE 300 MG PO TABS: ORAL_TABLET | ORAL | Status: DC

## 2011-07-27 NOTE — Progress Notes (Signed)
Patient ID: Frederick Ballard, male   DOB: 1986-02-23, 26 y.o.   MRN: [id number] Klein has spoken with his mother who told him she'd be glad to see him back home.  He is requesting to be discharged tomorrow morning and plans to take the bus home.  We have written him a letter for his work.  He will start back to work on Monday at 7:30am as is his usual schedule.   He is doing well on the SEroquel with no am sedation. He rates his depression a 1/10 on a 1-10 scale if 10 is the worst symptoms.  He denies suicidal thoughts, plans or intent.  Says he will stay off cocaine now that he realizes what it does to his thinking.

## 2011-07-27 NOTE — Progress Notes (Signed)
[facility] Group Notes:  (Counselor/Nursing/MHT/Case Management/Adjunct)  07/27/2011 11 AM  Type of Therapy:  Group Therapy, Dance/Movement Therapy   Participation Level:  Active  Participation Quality:  Appropriate, Attentive, Sharing and Supportive  Affect:  Appropriate  Cognitive:  Appropriate  Insight:  Limited  Engagement in Group:  Limited  Engagement in Therapy:  Limited  Modes of Intervention:  Clarification, Problem-solving, Role-play, Socialization and Support  Summary of Progress/Problems:Pt participated in a group discussion on how sunshine helps elevate mood. Pt stated that they feel "esthastic" today for he is excited to go home and be in the sunshine.  Gevena Mart

## 2011-07-27 NOTE — Progress Notes (Signed)
[facility] Case Management Discharge Plan:  Will you be returning to the same living situation after discharge: Yes,   Would you like a referral for services when you are discharged:Yes,   Do you have access to transportation at discharge:Yes,   Do you have the ability to pay for your medications:Yes,    Interagency Information:     Patient to Follow up at:  Follow-up Information    Follow up with Dr. Ladona Ridgel at [place] on 08/01/2011. (2:30pm appointment but they ask that you arrive NO LATER THAN 2:00pm to do paperwork)    Contact information:   [address] [place]  [address] Telephone:  [phone]         Patient denies SI/HI:   Yes,      Safety Planning and Suicide Prevention discussed:  Yes,    Barrier to discharge identified:None  Summary and Recommendations: Pt. Will follow up with follow up appointments and pt. Will go to live with his mother. Pt. Will also be transported home by his mother. Pt.'s mother's name is Alcario Drought [phone]   Neila Gear 07/27/2011, 11:36 AM

## 2011-07-27 NOTE — Progress Notes (Signed)
Patient ID: Frederick Ballard, male   DOB: 1985-12-19, 26 y.o.   MRN: [id number] Pt discharged to mother at this time, pt denies any SI/HI. Discharge instructions provided and pt verbalized understanding, all belongings returned, prescriptions and supply of medications provided

## 2011-07-27 NOTE — Progress Notes (Signed)
Patient ID: Frederick Ballard, male   DOB: 09-12-1985, 26 y.o.   MRN: [id number] The patient stated that he is being discharged home tomorrow. When asked if he has made any arrangements for out patient treatment, replied no. He is not sure where or how he will get his medication, but states that he can follow up with a therapist at [facility]. Encouraged to speak with a case manager before discharge  regarding follow up care.

## 2011-07-27 NOTE — Progress Notes (Signed)
Suicide Risk Assessment  Discharge Assessment     Demographic factors:  25 year single Time of Assessment: Discharge Current Mental Status:    No SI, NO HI, Mood ok, NO AVH, thought process logical, limited insight Risk Reduction Factors:  Risk Reduction Factors: Responsible for children under 26 years of age;Sense of responsibility to family;Employed;Living with another person, especially a relative  CLINICAL FACTORS:   abusing drugs including alcohol, mental illness, 1 attempt in past  COGNITIVE FEATURES THAT CONTRIBUTE TO RISK:  Closed-mindedness    SUICIDE RISK:   Moderate:  Frequent suicidal ideation with limited intensity, and duration, some specificity in terms of plans, no associated intent, good self-control, limited dysphoria/symptomatology, some risk factors present, and identifiable protective factors, including available and accessible social support.  PLAN OF CARE:  1. Follow up care as out pt. after discharge  2. Continue current med.  Wonda Cerise 07/27/2011, 12:15 PM

## 2011-07-29 NOTE — Discharge Summary (Signed)
  Identifying information: This is a 26 year old [place] American male, single, this is a voluntary admission. New  Date of admission: 07/23/2011. Date of discharge: 08/12/2010.  Discharge medications: Seroquel 300 mg tablet. Take one half tablet each bedtime, for psychosis. Sertraline 100 mg each bedtime, for depression.  Note: This patient is not being discharged on two antipsychotics.  Discharge diagnoses: Axis I: Schizoaffective disorder, depressed type, polysubstance abuse. Axis II: No diagnosis. Axis III: No diagnosis Axis IV: Deferred, stable home supportive mother is an asset Axis V: Current 58, past year 108 estimated.  Course of Hospitalization: Quantae presented with an exacerbation of his depression in the wake of his aunt's death. He reported that he had never been close to anyone who had died before, and this was a new experience for him. He presented expressing suicidal thoughts with a plan to possibly overdose on iodine. He reports that he has attempted suicide in the past by attempting to hang himself. He described his depression as coming in waves like a pot of boiling water that occasionally boils over.  He initially pesented in full contact with reality and denied any suicidal thoughts, plans or intent. He felt  he could benefit from substance abuse treatment, and reported using cocaine on an intermittent basis, and drinking alcohol most weekends up to 2, 40 ounce beers on weekend days. He used substanced on the weekend to relax after working all week and said it made him feel like 'I was living the life."  Yared was admitted for acute stabilization unit. He previously taken one half of the 300 mg Seroquel tabs every night, and would occasionally take one during the day as he felt he needed to. He is also taking sertraline 100 mg daily, he had not had any medication in a couple of months. He felt that they were beneficial and agreed to a retrial. Within 24 hours his auditory  hallucinations that had occurred prior to admission, were gone. He was denying any suicidal thoughts. His group participation was satisfactory, and he was appropriate with peers and staff. He voiced intent to stay off substances and to continue his medications, saying he felt better when his mind was not clouded. Her case manager contacted his mother who felt that he was safe to come home by January 13.

## 2011-07-29 NOTE — Progress Notes (Signed)
Patient Discharge Instructions:  Admission Note Faxed,  07/29/2011 After Visit Summary Faxed,  07/29/2011 Faxed to the Next Level Care provider:  07/29/2011 Facesheet faxed 07/29/2011   Faxed to [facility] @ [phone]  Wandra Scot, 07/29/2011, 10:51 AM

## 2013-09-05 ENCOUNTER — Encounter (HOSPITAL_COMMUNITY): Payer: Self-pay | Admitting: Emergency Medicine

## 2013-09-05 ENCOUNTER — Emergency Department (HOSPITAL_COMMUNITY): Payer: BC Managed Care – PPO

## 2013-09-05 ENCOUNTER — Emergency Department (HOSPITAL_COMMUNITY)
Admission: EM | Admit: 2013-09-05 | Discharge: 2013-09-05 | Disposition: A | Discharge status: Home / Self Care | Payer: BC Managed Care – PPO | Attending: Emergency Medicine | Admitting: Emergency Medicine

## 2013-09-05 DIAGNOSIS — F1010 Alcohol abuse, uncomplicated: Secondary | ICD-10-CM | POA: Insufficient documentation

## 2013-09-05 DIAGNOSIS — F1410 Cocaine abuse, uncomplicated: Secondary | ICD-10-CM | POA: Insufficient documentation

## 2013-09-05 DIAGNOSIS — F411 Generalized anxiety disorder: Secondary | ICD-10-CM | POA: Insufficient documentation

## 2013-09-05 DIAGNOSIS — R002 Palpitations: Secondary | ICD-10-CM | POA: Insufficient documentation

## 2013-09-05 DIAGNOSIS — F1910 Other psychoactive substance abuse, uncomplicated: Secondary | ICD-10-CM

## 2013-09-05 DIAGNOSIS — R079 Chest pain, unspecified: Secondary | ICD-10-CM | POA: Insufficient documentation

## 2013-09-05 DIAGNOSIS — R0602 Shortness of breath: Secondary | ICD-10-CM | POA: Insufficient documentation

## 2013-09-05 DIAGNOSIS — F17200 Nicotine dependence, unspecified, uncomplicated: Secondary | ICD-10-CM | POA: Insufficient documentation

## 2013-09-05 LAB — I-STAT TROPONIN, ED: Troponin i, poc: 0.01 ng/mL (ref 0.00–0.08)

## 2013-09-05 NOTE — ED Provider Notes (Signed)
CSN: [id number]     Arrival date & time 09/05/13  4098 History   First MD Initiated Contact with Patient 09/05/13 0848     Chief Complaint  Patient presents with  . Chest Pain  . Palpitations  . Shortness of Breath     (Consider location/radiation/quality/duration/timing/severity/associated sxs/prior Treatment) Patient is a 28 y.o. male presenting with chest pain, palpitations, and shortness of breath. The history is provided by the patient.  Chest Pain Associated symptoms: palpitations and shortness of breath   Associated symptoms: no abdominal pain, no back pain, no headache, no nausea, no numbness, not vomiting and no weakness   Palpitations Associated symptoms: chest pain and shortness of breath   Associated symptoms: no back pain, no nausea, no numbness and no vomiting   Shortness of Breath Associated symptoms: chest pain   Associated symptoms: no abdominal pain, no headaches, no rash and no vomiting    patient presents with the patient presents with chest pain shortness of breath palpitations. Began this weekend. He states that over this weekend he has been doing cocaine by snorting it and drinking alcohol. He still feels some chest pain shortness of breath. Distal over the chest. He appears anxious. No fevers. No cough. No nausea. He has done cocaine previously, but not recently. He has no known cardiac disease.  Past Medical History  Diagnosis Date  . Depression    Past Surgical History  Procedure Laterality Date  . Joint replacement  old basketball injury surrgery on leg.   No family history on file. History  Substance Use Topics  . Smoking status: Current Every Day Smoker -- 1.00 packs/day    Types: Cigarettes  . Smokeless tobacco: Not on file  . Alcohol Use: Yes    Review of Systems  Constitutional: Negative for activity change and appetite change.  Eyes: Negative for pain.  Respiratory: Positive for shortness of breath. Negative for chest tightness.    Cardiovascular: Positive for chest pain and palpitations. Negative for leg swelling.  Gastrointestinal: Negative for nausea, vomiting, abdominal pain and diarrhea.  Genitourinary: Negative for flank pain.  Musculoskeletal: Negative for back pain and neck stiffness.  Skin: Negative for rash.  Neurological: Negative for weakness, numbness and headaches.  Psychiatric/Behavioral: Negative for behavioral problems.      Allergies  Invega  Home Medications   No current outpatient prescriptions on file. BP 141/80  Pulse 74  Temp(Src) 97.9 F (36.6 C) (Oral)  Resp 13  Ht 5\' 8"  (1.727 m)  Wt 150 lb (68.04 kg)  BMI 22.81 kg/m2  SpO2 100% Physical Exam  Nursing note and vitals reviewed. Constitutional: He is oriented to person, place, and time. He appears well-developed and well-nourished.  HENT:  Head: Normocephalic and atraumatic.  Eyes: EOM are normal. Pupils are equal, round, and reactive to light.  Neck: Normal range of motion. Neck supple.  Cardiovascular: Normal rate, regular rhythm and normal heart sounds.   No murmur heard. Pulmonary/Chest: Effort normal and breath sounds normal.  Abdominal: Soft. Bowel sounds are normal. He exhibits no distension and no mass. There is no tenderness. There is no rebound and no guarding.  Musculoskeletal: Normal range of motion. He exhibits no edema.  Neurological: He is alert and oriented to person, place, and time. No cranial nerve deficit.  Skin: Skin is warm and dry.  Psychiatric:  Patient appears anxious and is up pacing.    ED Course  Procedures (including critical care time) Labs Review Labs Reviewed  Rosezena Sensor, ED  Imaging Review Dg Chest 2 View  09/05/2013   CLINICAL DATA:  Chest pain chest pain  EXAM: CHEST  2 VIEW  COMPARISON:  None.  FINDINGS: The heart size and mediastinal contours are within normal limits. Both lungs are clear. The visualized skeletal structures are unremarkable.  IMPRESSION: No active  cardiopulmonary disease.   Electronically Signed   By: Salome HolmesHector  Cooper M.D.   On: 09/05/2013 09:43    EKG Interpretation    Date/Time:  Monday September 05 2013 08:36:15 EST Ventricular Rate:  85 PR Interval:  158 QRS Duration: 96 QT Interval:  402 QTC Calculation: 478 R Axis:   45 Text Interpretation:  Normal sinus rhythm Possible Left atrial enlargement ST elevation, consider early repolarization, pericarditis, or injury Abnormal ECG Confirmed by Nargis Abrams  MD, Carissa Musick ([id number]) on 09/05/2013 9:00:27 AM            MDM   Final diagnoses:  Chest pain  Polysubstance abuse    Patient with chest pain. Has been doing a lot of cocaine over the weekend. EKG reassuring. Enzymes are negative. Will discharge home.    Juliet RudeNathan R. Rubin PayorPickering, MD 09/05/13 [phone]

## 2013-09-05 NOTE — Discharge Instructions (Signed)
Chest Pain (Nonspecific) °It is often hard to give a specific diagnosis for the cause of chest pain. There is always a chance that your pain could be related to something serious, such as a heart attack or a blood clot in the lungs. You need to follow up with your caregiver for further evaluation. °CAUSES  °· Heartburn. °· Pneumonia or bronchitis. °· Anxiety or stress. °· Inflammation around your heart (pericarditis) or lung (pleuritis or pleurisy). °· A blood clot in the lung. °· A collapsed lung (pneumothorax). It can develop suddenly on its own (spontaneous pneumothorax) or from injury (trauma) to the chest. °· Shingles infection (herpes zoster virus). °The chest wall is composed of bones, muscles, and cartilage. Any of these can be the source of the pain. °· The bones can be bruised by injury. °· The muscles or cartilage can be strained by coughing or overwork. °· The cartilage can be affected by inflammation and become sore (costochondritis). °DIAGNOSIS  °Lab tests or other studies, such as X-rays, electrocardiography, stress testing, or cardiac imaging, may be needed to find the cause of your pain.  °TREATMENT  °· Treatment depends on what may be causing your chest pain. Treatment may include: °· Acid blockers for heartburn. °· Anti-inflammatory medicine. °· Pain medicine for inflammatory conditions. °· Antibiotics if an infection is present. °· You may be advised to change lifestyle habits. This includes stopping smoking and avoiding alcohol, caffeine, and chocolate. °· You may be advised to keep your head raised (elevated) when sleeping. This reduces the chance of acid going backward from your stomach into your esophagus. °· Most of the time, nonspecific chest pain will improve within 2 to 3 days with rest and mild pain medicine. °HOME CARE INSTRUCTIONS  °· If antibiotics were prescribed, take your antibiotics as directed. Finish them even if you start to feel better. °· For the next few days, avoid physical  activities that bring on chest pain. Continue physical activities as directed. °· Do not smoke. °· Avoid drinking alcohol. °· Only take over-the-counter or prescription medicine for pain, discomfort, or fever as directed by your caregiver. °· Follow your caregiver's suggestions for further testing if your chest pain does not go away. °· Keep any follow-up appointments you made. If you do not go to an appointment, you could develop lasting (chronic) problems with pain. If there is any problem keeping an appointment, you must call to reschedule. °SEEK MEDICAL CARE IF:  °· You think you are having problems from the medicine you are taking. Read your medicine instructions carefully. °· Your chest pain does not go away, even after treatment. °· You develop a rash with blisters on your chest. °SEEK IMMEDIATE MEDICAL CARE IF:  °· You have increased chest pain or pain that spreads to your arm, neck, jaw, back, or abdomen. °· You develop shortness of breath, an increasing cough, or you are coughing up blood. °· You have severe back or abdominal pain, feel nauseous, or vomit. °· You develop severe weakness, fainting, or chills. °· You have a fever. °THIS IS AN EMERGENCY. Do not wait to see if the pain will go away. Get medical help at once. Call your local emergency services (911 in [place] Do not drive yourself to the hospital. °MAKE SURE YOU:  °· Understand these instructions. °· Will watch your condition. °· Will get help right away if you are not doing well or get worse. °Document Released: 04/09/2005 Document Revised: 09/22/2011 Document Reviewed: 02/03/2008 °ExitCare® Patient Information ©2014 [employer],   LLC.  Polysubstance Abuse When people abuse more than one drug or type of drug it is called polysubstance or polydrug abuse. For example, many smokers also drink alcohol. This is one form of polydrug abuse. Polydrug abuse also refers to the use of a drug to counteract an unpleasant effect produced by another drug. It  may also be used to help with withdrawal from another drug. People who take stimulants may become agitated. Sometimes this agitation is countered with a tranquilizer. This helps protect against the unpleasant side effects. Polydrug abuse also refers to the use of different drugs at the same time.  Anytime drug use is interfering with normal living activities, it has become abuse. This includes problems with family and friends. Psychological dependence has developed when your mind tells you that the drug is needed. This is usually followed by physical dependence which has developed when continuing increases of drug are required to get the same feeling or "high". This is known as addiction or chemical dependency. A person's risk is much higher if there is a history of chemical dependency in the family. SIGNS OF CHEMICAL DEPENDENCY  You have been told by friends or family that drugs have become a problem.  You fight when using drugs.  You are having blackouts (not remembering what you do while using).  You feel sick from using drugs but continue using.  You lie about use or amounts of drugs (chemicals) used.  You need chemicals to get you going.  You are suffering in work performance or in school because of drug use.  You get sick from use of drugs but continue to use anyway.  You need drugs to relate to people or feel comfortable in social situations.  You use drugs to forget problems. "Yes" answered to any of the above signs of chemical dependency indicates there are problems. The longer the use of drugs continues, the greater the problems will become. If there is a family history of drug or alcohol use, it is best not to experiment with these drugs. Continual use leads to tolerance. After tolerance develops more of the drug is needed to get the same feeling. This is followed by addiction. With addiction, drugs become the most important part of life. It becomes more important to take drugs  than participate in the other usual activities of life. This includes relating to friends and family. Addiction is followed by dependency. Dependency is a condition where drugs are now needed not just to get high, but to feel normal. Addiction cannot be cured but it can be stopped. This often requires outside help and the care of professionals. Treatment centers are listed in the yellow pages under: Cocaine, Narcotics, and Alcoholics Anonymous. Most hospitals and clinics can refer you to a specialized care center. Talk to your caregiver if you need help. Document Released: 02/19/2005 Document Revised: 09/22/2011 Document Reviewed: 06/30/2005 [facility] Patient Information 2014 [place], [place].
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
## 2013-09-05 NOTE — ED Notes (Addendum)
Pt refuses to get in a gown at this time.

## 2013-09-05 NOTE — ED Notes (Signed)
EKG completed in triage and signed by EDP.  

## 2015-11-07 ENCOUNTER — Emergency Department (HOSPITAL_COMMUNITY): Payer: BC Managed Care – PPO

## 2015-11-07 ENCOUNTER — Emergency Department (HOSPITAL_COMMUNITY)
Admission: EM | Admit: 2015-11-07 | Discharge: 2015-11-07 | Disposition: A | Discharge status: Home / Self Care | Payer: BC Managed Care – PPO | Attending: Emergency Medicine | Admitting: Emergency Medicine

## 2015-11-07 ENCOUNTER — Encounter (HOSPITAL_COMMUNITY): Payer: Self-pay | Admitting: *Deleted

## 2015-11-07 DIAGNOSIS — R05 Cough: Secondary | ICD-10-CM | POA: Diagnosis present

## 2015-11-07 DIAGNOSIS — Z8659 Personal history of other mental and behavioral disorders: Secondary | ICD-10-CM | POA: Diagnosis not present

## 2015-11-07 DIAGNOSIS — J209 Acute bronchitis, unspecified: Secondary | ICD-10-CM | POA: Insufficient documentation

## 2015-11-07 DIAGNOSIS — J069 Acute upper respiratory infection, unspecified: Secondary | ICD-10-CM | POA: Diagnosis not present

## 2015-11-07 DIAGNOSIS — H9209 Otalgia, unspecified ear: Secondary | ICD-10-CM | POA: Diagnosis not present

## 2015-11-07 DIAGNOSIS — F17210 Nicotine dependence, cigarettes, uncomplicated: Secondary | ICD-10-CM | POA: Diagnosis not present

## 2015-11-07 MED ORDER — BENZONATATE 100 MG PO CAPS
200.0000 mg | ORAL_CAPSULE | Freq: Once | ORAL | Status: AC
  Administered 2015-11-07: 200 mg via ORAL
  Filled 2015-11-07: qty 2

## 2015-11-07 MED ORDER — PREDNISONE 20 MG PO TABS
60.0000 mg | ORAL_TABLET | Freq: Once | ORAL | Status: AC
  Administered 2015-11-07: 60 mg via ORAL
  Filled 2015-11-07: qty 3

## 2015-11-07 MED ORDER — IPRATROPIUM-ALBUTEROL 0.5-2.5 (3) MG/3ML IN SOLN
3.0000 mL | Freq: Once | RESPIRATORY_TRACT | Status: AC
  Administered 2015-11-07: 3 mL via RESPIRATORY_TRACT
  Filled 2015-11-07: qty 3

## 2015-11-07 MED ORDER — GUAIFENESIN-CODEINE 100-10 MG/5ML PO SOLN: 5.0000 mL | Freq: Three times a day (TID) | ORAL | Status: DC | PRN

## 2015-11-07 MED ORDER — PREDNISONE 20 MG PO TABS: 40.0000 mg | ORAL_TABLET | Freq: Every day | ORAL | Status: DC

## 2015-11-07 MED ORDER — BENZONATATE 100 MG PO CAPS: 200.0000 mg | ORAL_CAPSULE | Freq: Two times a day (BID) | ORAL | Status: DC | PRN

## 2015-11-07 MED ORDER — ALBUTEROL SULFATE HFA 108 (90 BASE) MCG/ACT IN AERS
2.0000 | INHALATION_SPRAY | Freq: Once | RESPIRATORY_TRACT | Status: AC
  Administered 2015-11-07: 2 via RESPIRATORY_TRACT
  Filled 2015-11-07: qty 6.7

## 2015-11-07 NOTE — ED Provider Notes (Signed)
CSN: [id number]     Arrival date & time 11/07/15  1738 History   By signing my name below, I, Evon Slack, attest that this documentation has been prepared under the direction and in the presence of Danelle Berry, PA-C. Electronically Signed: Evon Slack, ED Scribe. 11/07/2015. 6:26 PM.    Chief Complaint  Patient presents with  . URI   The history is provided by the patient. No language interpreter was used.   HPI Comments: Frederick Ballard is a 30 y.o. male who presents to the Emergency Department complaining of URI symptoms onset 2 days prior. Pt reports productive cough of clear sputum, congestion, chills, sneezing, sore throat, sinus pressure, ear pain, and generalized myalgias. Pt reports that he would have some associated rib pain only when coughing. Pt states that he has tried mucinex with no relief. Denies fever, chest tightness, SOB, abdominal pain or n/v/d. Pt reports that he smokes about 4 cigarettes per day for the last 4 years.   Past Medical History  Diagnosis Date  . Depression    Past Surgical History  Procedure Laterality Date  . Joint replacement  old basketball injury surrgery on leg.   No family history on file. Social History  Substance Use Topics  . Smoking status: Current Every Day Smoker -- 1.00 packs/day    Types: Cigarettes  . Smokeless tobacco: None  . Alcohol Use: Yes    Review of Systems  Constitutional: Positive for chills. Negative for fever.  HENT: Positive for congestion, ear pain, sinus pressure, sneezing and sore throat.   Respiratory: Positive for cough. Negative for chest tightness and shortness of breath.   Gastrointestinal: Negative for nausea, vomiting, abdominal pain and diarrhea.  Musculoskeletal: Positive for myalgias.  All other systems reviewed and are negative.    Allergies  Invega  Home Medications   Prior to Admission medications   Medication Sig Start Date End Date Taking? Authorizing Provider  benzonatate  (TESSALON) 100 MG capsule Take 2 capsules (200 mg total) by mouth 2 (two) times daily as needed for cough. 11/07/15   Danelle Berry, PA-C  guaiFENesin-codeine 100-10 MG/5ML syrup Take 5 mLs by mouth 3 (three) times daily as needed for cough. 11/07/15   Danelle Berry, PA-C  predniSONE (DELTASONE) 20 MG tablet Take 2 tablets (40 mg total) by mouth daily. 11/07/15   Danelle Berry, PA-C   BP 139/87 mmHg  Pulse 95  Temp(Src) 98.9 F (37.2 C) (Oral)  Resp 18  SpO2 100%   Physical Exam  Constitutional: He is oriented to person, place, and time. He appears well-developed and well-nourished. No distress.  HENT:  Head: Normocephalic and atraumatic.  Right Ear: External ear normal.  Left Ear: External ear normal.  Nose: Nose normal.  Mouth/Throat: Oropharynx is clear and moist. No oropharyngeal exudate.  Bilateral TM's appear normal Nasal mucosa erythematous with discharge   Eyes: Conjunctivae and EOM are normal. Pupils are equal, round, and reactive to light. Right eye exhibits no discharge. Left eye exhibits no discharge. No scleral icterus.  Neck: Normal range of motion. Neck supple. No JVD present. No tracheal deviation present. No thyromegaly present.  Cardiovascular: Normal rate, regular rhythm, normal heart sounds and intact distal pulses.  Exam reveals no gallop and no friction rub.   No murmur heard. Pulmonary/Chest: Effort normal. No respiratory distress. He has no wheezes. He has no rales. He exhibits no tenderness.  Decreased BS bilaterally at the bases, no wheeze, rales, rhonchi  Abdominal: Soft. Bowel sounds are normal.  He exhibits no distension and no mass. There is no tenderness. There is no rebound and no guarding.  Musculoskeletal: Normal range of motion. He exhibits no edema or tenderness.  Lymphadenopathy:    He has no cervical adenopathy.  Neurological: He is alert and oriented to person, place, and time. He has normal reflexes. No cranial nerve deficit. He exhibits normal muscle  tone. Coordination normal.  Skin: Skin is warm and dry. No rash noted. He is not diaphoretic. No erythema. No pallor.  Psychiatric: He has a normal mood and affect. His behavior is normal. Judgment and thought content normal.  Nursing note and vitals reviewed.   ED Course  Procedures (including critical care time) DIAGNOSTIC STUDIES: Oxygen Saturation is 100% on RA, normal by my interpretation.    COORDINATION OF CARE: 6:24 PM-Discussed treatment plan with pt at bedside and pt agreed to plan.     Labs Review Labs Reviewed - No data to display  Imaging Review Dg Chest 2 View  11/07/2015  CLINICAL DATA:  Patient with cough and congestion for 2 days. EXAM: CHEST  2 VIEW COMPARISON:  Chest radiograph 09/06/2011. FINDINGS: Stable cardiac and mediastinal contours. No consolidative pulmonary opacities. No pleural effusion or pneumothorax. Regional skeleton is unremarkable. IMPRESSION: No active cardiopulmonary disease. Electronically Signed   By: Annia Beltrew  Davis M.D.   On: 11/07/2015 19:09      EKG Interpretation None      MDM   [age] y/o male, current smoker, presents with URI/viral sx with cough CXR negative Pt given breathing tx, tessalon pearls and prednisone in ER.  He is non-toxic in appearance, VSS, will d/c with tx for bronchitis, cough suppressant/expectorant prescribed.  Return precautions reviewed.  Pt d/c in good condition.  Final diagnoses:  URI (upper respiratory infection)  Acute bronchitis, unspecified organism   I personally performed the services described in this documentation, which was scribed in my presence. The recorded information has been reviewed and is accurate.       Danelle BerryLeisa Dillan Candela, PA-C 11/08/15 0248  Tilden FossaElizabeth Rees, MD 11/09/15 [phone]
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
## 2015-11-07 NOTE — Discharge Instructions (Signed)
Viral Infections °A viral infection can be caused by different types of viruses. Most viral infections are not serious and resolve on their own. However, some infections may cause severe symptoms and may lead to further complications. °SYMPTOMS °Viruses can frequently cause: °· Minor sore throat. °· Aches and pains. °· Headaches. °· Runny nose. °· Different types of rashes. °· Watery eyes. °· Tiredness. °· Cough. °· Loss of appetite. °· Gastrointestinal infections, resulting in nausea, vomiting, and diarrhea. °These symptoms do not respond to antibiotics because the infection is not caused by bacteria. However, you might catch a bacterial infection following the viral infection. This is sometimes called a "superinfection." Symptoms of such a bacterial infection may include: °· Worsening sore throat with pus and difficulty swallowing. °· Swollen neck glands. °· Chills and a high or persistent fever. °· Severe headache. °· Tenderness over the sinuses. °· Persistent overall ill feeling (malaise), muscle aches, and tiredness (fatigue). °· Persistent cough. °· Yellow, green, or brown mucus production with coughing. °HOME CARE INSTRUCTIONS  °· Only take over-the-counter or prescription medicines for pain, discomfort, diarrhea, or fever as directed by your caregiver. °· Drink enough water and fluids to keep your urine clear or pale yellow. Sports drinks can provide valuable electrolytes, sugars, and hydration. °· Get plenty of rest and maintain proper nutrition. Soups and broths with crackers or rice are fine. °SEEK IMMEDIATE MEDICAL CARE IF:  °· You have severe headaches, shortness of breath, chest pain, neck pain, or an unusual rash. °· You have uncontrolled vomiting, diarrhea, or you are unable to keep down fluids. °· You or your child has an oral temperature above 102° F (38.9° C), not controlled by medicine. °· Your baby is older than 3 months with a rectal temperature of 102° F (38.9° C) or higher. °· Your baby is 3  months old or younger with a rectal temperature of 100.4° F (38° C) or higher. °MAKE SURE YOU:  °· Understand these instructions. °· Will watch your condition. °· Will get help right away if you are not doing well or get worse. °  °This information is not intended to replace advice given to you by your health care provider. Make sure you discuss any questions you have with your health care provider. °  °Document Released: 04/09/2005 Document Revised: 09/22/2011 Document Reviewed: 12/06/2014 °Elsevier Interactive Patient Education ©2016 Else[employer] Inc. °Acute Bronchitis °Bronchitis is inflammation of the airways that extend from the windpipe into the lungs (bronchi). The inflammation often causes mucus to develop. This leads to a cough, which is the most common symptom of bronchitis.  °In acute bronchitis, the condition usually develops suddenly and goes away over time, usually in a couple weeks. Smoking, allergies, and asthma can make bronchitis worse. Repeated episodes of bronchitis may cause further lung problems.  °CAUSES °Acute bronchitis is most often caused by the same virus that causes a cold. The virus can spread from person to person (contagious) through coughing, sneezing, and touching contaminated objects. °SIGNS AND SYMPTOMS  °· Cough.   °· Fever.   °· Coughing up mucus.   °· Body aches.   °· Chest congestion.   °· Chills.   °· Shortness of breath.   °· Sore throat.   °DIAGNOSIS  °Acute bronchitis is usually diagnosed through a physical exam. Your health care provider will also ask you questions about your medical history. Tests, such as chest X-rays, are sometimes done to rule out other conditions.  °TREATMENT  °Acute bronchitis usually goes away in a couple weeks. Oftentimes, no medical treatment is necessary.   Medicines are sometimes given for relief of fever or cough. Antibiotic medicines are usually not needed but may be prescribed in certain situations. In some cases, an inhaler may be recommended to  help reduce shortness of breath and control the cough. A cool mist vaporizer may also be used to help thin bronchial secretions and make it easier to clear the chest.  °HOME CARE INSTRUCTIONS °· Get plenty of rest.   °· Drink enough fluids to keep your urine clear or pale yellow (unless you have a medical condition that requires fluid restriction). Increasing fluids may help thin your respiratory secretions (sputum) and reduce chest congestion, and it will prevent dehydration.   °· Take medicines only as directed by your health care provider. °· If you were prescribed an antibiotic medicine, finish it all even if you start to feel better. °· Avoid smoking and secondhand smoke. Exposure to cigarette smoke or irritating chemicals will make bronchitis worse. If you are a smoker, consider using nicotine gum or skin patches to help control withdrawal symptoms. Quitting smoking will help your lungs heal faster.   °· Reduce the chances of another bout of acute bronchitis by washing your hands frequently, avoiding people with cold symptoms, and trying not to touch your hands to your mouth, nose, or eyes.   °· Keep all follow-up visits as directed by your health care provider.   °SEEK MEDICAL CARE IF: °Your symptoms do not improve after 1 week of treatment.  °SEEK IMMEDIATE MEDICAL CARE IF: °· You develop an increased fever or chills.   °· You have chest pain.   °· You have severe shortness of breath. °· You have bloody sputum.   °· You develop dehydration. °· You faint or repeatedly feel like you are going to pass out. °· You develop repeated vomiting. °· You develop a severe headache. °MAKE SURE YOU:  °· Understand these instructions. °· Will watch your condition. °· Will get help right away if you are not doing well or get worse. °  °This information is not intended to replace advice given to you by your health care provider. Make sure you discuss any questions you have with your health care provider. °  °Document  Released: 08/07/2004 Document Revised: 07/21/2014 Document Reviewed: 12/21/2012 °Elsevier Interactive Patient Education ©2016 Elsevier Inc. ° ° °

## 2016-01-27 ENCOUNTER — Encounter (HOSPITAL_COMMUNITY): Payer: Self-pay

## 2016-01-27 ENCOUNTER — Emergency Department (HOSPITAL_COMMUNITY)
Admission: EM | Admit: 2016-01-27 | Discharge: 2016-01-27 | Disposition: A | Discharge status: Home / Self Care | Payer: BC Managed Care – PPO | Attending: Emergency Medicine | Admitting: Emergency Medicine

## 2016-01-27 DIAGNOSIS — S3994XA Unspecified injury of external genitals, initial encounter: Secondary | ICD-10-CM | POA: Diagnosis present

## 2016-01-27 DIAGNOSIS — Y929 Unspecified place or not applicable: Secondary | ICD-10-CM | POA: Insufficient documentation

## 2016-01-27 DIAGNOSIS — R300 Dysuria: Secondary | ICD-10-CM | POA: Diagnosis not present

## 2016-01-27 DIAGNOSIS — S30812A Abrasion of penis, initial encounter: Secondary | ICD-10-CM | POA: Insufficient documentation

## 2016-01-27 DIAGNOSIS — Z9660 Presence of unspecified orthopedic joint implant: Secondary | ICD-10-CM | POA: Insufficient documentation

## 2016-01-27 DIAGNOSIS — F17210 Nicotine dependence, cigarettes, uncomplicated: Secondary | ICD-10-CM | POA: Diagnosis not present

## 2016-01-27 DIAGNOSIS — W230XXA Caught, crushed, jammed, or pinched between moving objects, initial encounter: Secondary | ICD-10-CM | POA: Insufficient documentation

## 2016-01-27 DIAGNOSIS — Y939 Activity, unspecified: Secondary | ICD-10-CM | POA: Diagnosis not present

## 2016-01-27 DIAGNOSIS — Y999 Unspecified external cause status: Secondary | ICD-10-CM | POA: Diagnosis not present

## 2016-01-27 DIAGNOSIS — N489 Disorder of penis, unspecified: Secondary | ICD-10-CM

## 2016-01-27 LAB — URINALYSIS, ROUTINE W REFLEX MICROSCOPIC
Bilirubin Urine: NEGATIVE
Glucose, UA: NEGATIVE mg/dL
Ketones, ur: NEGATIVE mg/dL
Leukocytes, UA: NEGATIVE
Nitrite: NEGATIVE
Protein, ur: NEGATIVE mg/dL
Specific Gravity, Urine: 1.027 (ref 1.005–1.030)
pH: 5.5 (ref 5.0–8.0)

## 2016-01-27 LAB — URINE MICROSCOPIC-ADD ON
BACTERIA UA: NONE SEEN
RBC / HPF: NONE SEEN RBC/hpf (ref 0–5)
SQUAMOUS EPITHELIAL / LPF: NONE SEEN

## 2016-01-27 MED ORDER — AZITHROMYCIN 250 MG PO TABS
1000.0000 mg | ORAL_TABLET | Freq: Once | ORAL | Status: AC
  Administered 2016-01-27: 1000 mg via ORAL
  Filled 2016-01-27: qty 4

## 2016-01-27 MED ORDER — CEFTRIAXONE SODIUM 250 MG IJ SOLR
250.0000 mg | Freq: Once | INTRAMUSCULAR | Status: AC
  Administered 2016-01-27: 250 mg via INTRAMUSCULAR
  Filled 2016-01-27: qty 250

## 2016-01-27 MED ORDER — LIDOCAINE HCL (PF) 1 % IJ SOLN
5.0000 mL | Freq: Once | INTRAMUSCULAR | Status: AC
  Administered 2016-01-27: 0.9 mL
  Filled 2016-01-27: qty 5

## 2016-01-27 NOTE — Discharge Instructions (Signed)
You may apply a small amount of antibiotic ointment (Bacitracin or Neosporin) to wound daily. I recommend following up at in Urgent Care or at the primary care office listed above in 3-4 days for wound recheck.  1. Medications: usual home medications 2. Treatment: rest, drink plenty of fluids, use a condom with every sexual encounter 3. Follow Up: Please followup with your primary doctor in 3 days for discussion of your diagnoses and further evaluation after today's visit; if you do not have a primary care doctor use the resource guide provided to find one; Please return to the ER for worsening symptoms, high fevers or persistent vomiting.  You have been tested for HIV, syphilis, chlamydia and gonorrhea.  These results will be available in approximately 3 days.  Please inform all sexual partners if you test positive for any of these diseases.

## 2016-01-27 NOTE — ED Provider Notes (Signed)
CSN: [id number]     Arrival date & time 01/27/16  1350 History  By signing my name below, I, Evon Ballard, attest that this documentation has been prepared under the direction and in the presence of [employer], PA-C. Electronically Signed: Evon Ballard, ED Scribe. 01/27/2016. 5:14 PM.    Chief Complaint  Patient presents with  . Dysuria   The history is provided by the patient. No language interpreter was used.   HPI Comments: Frederick Ballard is a 30 y.o. male who presents to the Emergency Department complaining of dysuria onset 2 days prior. Pt reports having a genital sore on his penis. Pt states about 1 week ago that while he was zipping his paints he accidentally caught his penis and cut the top of his penis prior to all his symptoms presenting. Denies any medications PTA. Pt states that he is sexually active with 1 partner and does not use protection. Denies hematuria, frequency, urinary retention, penile discharge, testicle swelling, abdominal pain, n/v or fever.   Past Medical History  Diagnosis Date  . Depression    Past Surgical History  Procedure Laterality Date  . Joint replacement  old basketball injury surrgery on leg.   No family history on file. Social History  Substance Use Topics  . Smoking status: Current Every Day Smoker -- 1.00 packs/day    Types: Cigarettes  . Smokeless tobacco: None  . Alcohol Use: Yes    Review of Systems  Constitutional: Negative for fever.  Gastrointestinal: Negative for nausea, vomiting and abdominal pain.  Genitourinary: Positive for dysuria and genital sores. Negative for hematuria, discharge, scrotal swelling and penile pain.      Allergies  Invega  Home Medications   Prior to Admission medications   Medication Sig Start Date End Date Taking? Authorizing Provider  benzonatate (TESSALON) 100 MG capsule Take 2 capsules (200 mg total) by mouth 2 (two) times daily as needed for cough. 11/07/15   Danelle Berry, PA-C   guaiFENesin-codeine 100-10 MG/5ML syrup Take 5 mLs by mouth 3 (three) times daily as needed for cough. 11/07/15   Danelle Berry, PA-C  predniSONE (DELTASONE) 20 MG tablet Take 2 tablets (40 mg total) by mouth daily. 11/07/15   Danelle Berry, PA-C   BP 147/103 mmHg  Pulse 87  Temp(Src) 98.4 F (36.9 C) (Oral)  Resp 18  SpO2 99%   Physical Exam  Constitutional: He is oriented to person, place, and time. He appears well-developed and well-nourished.  HENT:  Head: Normocephalic and atraumatic.  Mouth/Throat: Oropharynx is clear and moist. No oropharyngeal exudate.  Eyes: Conjunctivae and EOM are normal. Right eye exhibits no discharge. Left eye exhibits no discharge. No scleral icterus.  Neck: Normal range of motion. Neck supple.  Cardiovascular: Normal rate.   Pulmonary/Chest: Effort normal. No respiratory distress.  Abdominal: Soft. He exhibits no distension and no mass. There is no tenderness. There is no rebound and no guarding. Hernia confirmed negative in the right inguinal area and confirmed negative in the left inguinal area.  No CVA tenderness  Genitourinary: Testes normal and penis normal.    Right testis shows no mass, no swelling and no tenderness. Left testis shows no mass, no swelling and no tenderness. No phimosis, paraphimosis, hypospadias, penile erythema or penile tenderness. No discharge found.  Small superficial abrasion noted to the dorsal aspect of distal penile shaft. No surrounding swelling erythema warmth or drainage. No active bleeding. Mildly tender to palpation. No evidince of skin ulcer, chancroid or chancre.  Lymphadenopathy:       Right: No inguinal adenopathy present.       Left: No inguinal adenopathy present.  Neurological: He is alert and oriented to person, place, and time.  Skin: Skin is warm and dry.  Nursing note and vitals reviewed.   ED Course  Procedures (including critical care time) DIAGNOSTIC STUDIES: Oxygen Saturation is 99% on RA, normal by  my interpretation.    COORDINATION OF CARE: 4:58 PM-Discussed treatment plan which includes UA with pt at bedside and pt agreed to plan.     Labs Review Labs Reviewed  URINALYSIS, ROUTINE W REFLEX MICROSCOPIC (NOT AT [facility]) - Abnormal; Notable for the following:    Hgb urine dipstick TRACE (*)    All other components within normal limits  URINE CULTURE  URINE MICROSCOPIC-ADD ON  RPR  HIV ANTIBODY (ROUTINE TESTING)  GC/CHLAMYDIA PROBE AMP ([place]) NOT AT [facility]    Imaging Review No results found. I have personally reviewed and evaluated these lab results as part of my medical decision-making.   EKG Interpretation None      MDM   Final diagnoses:  Penile lesion   Patient presents with dysuria and associated wound to his penis after the pain his pants 1 week ago. Denies fever, penile discharge, hematuria, urinary frequency, urinary retention, abdominal pain. Patient reports being sexually active with one partner and denies using the forearm protection. VSS. Exam revealed 0.5cm superficial abrasion noted to dorsal aspect of penis proximal to glans penis, no active bleeding or drainage, no surrounding swelling, erythema or warmth noted. Lesion mildly tender to palpation. Remaining GU exam unremarkable, no penile discharge noted. No tenderness to palpation of the testes or epididymis to suggest orchitis or epididymitis.  STD cultures obtained including HIV, syphilis, gonorrhea and chlamydia. UA positive for trace hemoglobin, otherwise unremarkable. I do not suspect a UTI at this time however due to patient complaining of dysuria. Urine for culture. Penile lesion appears to be consistent with abrasion vs small chancroid. Pt understands that they have GC/Chlamydia cultures pending and that they will need to inform all sexual partners if results return positive. Patient has been treated prophylactically with azithromycin and Rocephin. Advised patient to follow up with PCP or urgent care in  3-4 days for wound recheck. Discussed return precautions with patient.  I personally performed the services described in this documentation, which was scribed in my presence. The recorded information has been reviewed and is accurate.      Satira Sarkicole Elizabeth HornellNadeau, New JerseyPA-C 01/27/16 1845  Vanetta MuldersScott Zackowski, MD 01/28/16 [phone]

## 2016-01-27 NOTE — ED Notes (Signed)
Patient here with dysuria x 1 week. States that he has small bump on penis and discomfort with same

## 2016-01-27 NOTE — ED Notes (Signed)
Called Pt's name. No response. Checked sub waiting. No response.

## 2016-01-28 LAB — HIV ANTIBODY (ROUTINE TESTING W REFLEX): HIV SCREEN 4TH GENERATION: NONREACTIVE

## 2016-01-28 LAB — URINE CULTURE: Culture: <10000 — AB

## 2016-01-28 LAB — GC/CHLAMYDIA PROBE AMP (~~LOC~~) NOT AT ARMC
CHLAMYDIA, DNA PROBE: NEGATIVE
NEISSERIA GONORRHEA: NEGATIVE

## 2016-01-28 LAB — RPR: RPR: NONREACTIVE

## 2016-02-03 ENCOUNTER — Emergency Department (HOSPITAL_COMMUNITY)
Admission: EM | Admit: 2016-02-03 | Discharge: 2016-02-03 | Discharge status: Absent without leave | Payer: BC Managed Care – PPO

## 2018-01-08 ENCOUNTER — Ambulatory Visit (HOSPITAL_COMMUNITY)
Admission: EM | Admit: 2018-01-08 | Discharge: 2018-01-08 | Disposition: A | Discharge status: Home / Self Care | Payer: BC Managed Care – PPO | Attending: Family Medicine | Admitting: Family Medicine

## 2018-01-08 ENCOUNTER — Encounter (HOSPITAL_COMMUNITY): Payer: Self-pay | Admitting: Emergency Medicine

## 2018-01-08 DIAGNOSIS — K029 Dental caries, unspecified: Secondary | ICD-10-CM

## 2018-01-08 DIAGNOSIS — K219 Gastro-esophageal reflux disease without esophagitis: Secondary | ICD-10-CM

## 2018-01-08 MED ORDER — OMEPRAZOLE 20 MG PO CPDR: 20.0000 mg | DELAYED_RELEASE_CAPSULE | Freq: Every day | ORAL | 0 refills | Status: DC

## 2018-01-08 MED ORDER — IBUPROFEN 800 MG PO TABS: 800.0000 mg | ORAL_TABLET | Freq: Three times a day (TID) | ORAL | 0 refills | Status: DC | PRN

## 2018-01-08 MED ORDER — HYDROCODONE-ACETAMINOPHEN 7.5-325 MG PO TABS: 1.0000 | ORAL_TABLET | Freq: Four times a day (QID) | ORAL | 0 refills | Status: DC | PRN

## 2018-01-08 NOTE — Discharge Instructions (Signed)
Take the omeprazole once a day for heartburn/acid reflux Take the ibuprofen 3 x a day with food for dental pain When pain severe may take the hydrocodone Hydrocodone can cause drowsiness, constipation Call dentist Monday

## 2018-01-08 NOTE — ED Provider Notes (Signed)
[facility]    CSN: [id number] Arrival date & time: 01/08/18  1950     History   Chief Complaint Chief Complaint  Patient presents with  . Chest Pain  . Dental Pain    HPI Frederick Ballard is a 32 y.o. male.   HPI  Here for evaluation of dental pain.  He has had dental pain for some time off and on.  He has a known large cavity in his wisdom tooth on the bottom left.  He states for the last week is become more painful.  He states now it is throbbing and he can hardly open his mouth, hardly eat.  Denies any fever or chills.  Denies any redness or drainage.  His intention is to call a dentist on Monday.  He wants medication for pain. He also complained initially of chest pain.  He states this resolved.  It happened earlier today while he was working.  It went away after he belched.  He states that it was a pressure/"stinging" in the center of his chest.  It was brief.  No radiation.  Not exertional.  Not worse with a deep breath.  No diaphoresis.  No cardiac risk factors, no hypertension, diabetes, serious smoking, family history, or high cholesterol.  He is never had heart problems.  Past Medical History:  Diagnosis Date  . Depression     Patient Active Problem List   Diagnosis Date Noted  . Polysubstance abuse (HCC) 07/24/2011  . Schizoaffective disorder, depressive type (HCC) 07/24/2011    Past Surgical History:  Procedure Laterality Date  . JOINT REPLACEMENT  old basketball injury surrgery on leg.       Home Medications    Prior to Admission medications   Medication Sig Start Date End Date Taking? Authorizing Provider  HYDROcodone-acetaminophen (NORCO) 7.5-325 MG tablet Take 1 tablet by mouth every 6 (six) hours as needed for severe pain. 01/08/18   Eustace Moore, MD  ibuprofen (ADVIL,MOTRIN) 800 MG tablet Take 1 tablet (800 mg total) by mouth every 8 (eight) hours as needed for moderate pain. 01/08/18   Eustace Moore, MD  omeprazole (PRILOSEC) 20  MG capsule Take 1 capsule (20 mg total) by mouth daily. 01/08/18   Eustace Moore, MD    Family History No family history on file.  Social History Social History   Tobacco Use  . Smoking status: Current Every Day Smoker    Packs/day: 1.00    Types: Cigarettes  Substance Use Topics  . Alcohol use: Yes  . Drug use: Yes    Types: Cocaine, Marijuana    Comment: former marijuana user     Allergies   Invega [paliperidone]   Review of Systems Review of Systems  Constitutional: Negative for chills and fever.  HENT: Positive for dental problem. Negative for ear pain and sore throat.   Eyes: Negative for pain and visual disturbance.  Respiratory: Negative for cough and shortness of breath.   Cardiovascular: Negative for chest pain and palpitations.  Gastrointestinal: Negative for abdominal pain and vomiting.  Genitourinary: Negative for dysuria and hematuria.  Musculoskeletal: Negative for arthralgias and back pain.  Skin: Negative for color change and rash.  Neurological: Negative for seizures and syncope.  All other systems reviewed and are negative.    Physical Exam Triage Vital Signs  No data found.  Updated Vital Signs BP (!) 146/99 (BP Location: Right Arm)   Pulse 99   Temp 99.4 F (37.4 C) (Oral)  Resp 16   SpO2 95%       Physical Exam  Constitutional: He appears well-developed and well-nourished. No distress.  Appears uncomfortable due to jaw pain  HENT:  Head: Normocephalic and atraumatic.  Mouth/Throat: Oropharynx is clear and moist.  Posterior molar on the left has a deep cavity.  There is no erythema of the surrounding gums.  Eyes: Pupils are equal, round, and reactive to light. Conjunctivae are normal.  Neck: Normal range of motion.  Cardiovascular: Normal rate and normal pulses. An irregularly irregular rhythm present. PMI is not displaced.  Pulmonary/Chest: Effort normal and breath sounds normal. No respiratory distress.  Abdominal: Soft.  Bowel sounds are normal. He exhibits no distension. There is no tenderness.  Musculoskeletal: Normal range of motion. He exhibits no edema.       Right lower leg: Normal. He exhibits no edema.       Left lower leg: Normal. He exhibits no edema.  Lymphadenopathy:    He has cervical adenopathy.  Neurological: He is alert.  Skin: Skin is warm and dry.  Psychiatric: He has a normal mood and affect. His behavior is normal.     UC Treatments / Results  Labs (all labs ordered are listed, but only abnormal results are displayed) Labs Reviewed - No data to display  EKG None  Radiology No results found.  Procedures Procedures (including critical care time)  Medications Ordered in UC Medications - No data to display  Initial Impression / Assessment and Plan / UC Course  I have reviewed the triage vital signs and the nursing notes.  Pertinent labs & imaging results that were available during my care of the patient were reviewed by me and considered in my medical decision making (see chart for details).     *Discussed that I do not think his chest pain was at all cardiac.  Sounds like it has GERD.  He has a history of heartburn.  He got better with a belch.  Medicate him omeprazole. For his dental pain I am going to give him ibuprofen 800 3 times daily.  He can have a limited number of pain medication, Norco 7.5.  He has a history of polysubstance abuse.  He states he has been through rehab and has not used for years.  [facility] and there were 0 prescriptions. Final Clinical Impressions(s) / UC Diagnoses   Final diagnoses:  Pain due to dental caries  Gastroesophageal reflux disease, esophagitis presence not specified     Discharge Instructions     Take the omeprazole once a day for heartburn/acid reflux Take the ibuprofen 3 x a day with food for dental pain When pain severe may take the hydrocodone Hydrocodone can cause drowsiness,  constipation Call dentist Monday    ED Prescriptions    Medication Sig Dispense Auth. Provider   omeprazole (PRILOSEC) 20 MG capsule Take 1 capsule (20 mg total) by mouth daily. 30 capsule Eustace MooreNelson, Yvonne Sue, MD   ibuprofen (ADVIL,MOTRIN) 800 MG tablet Take 1 tablet (800 mg total) by mouth every 8 (eight) hours as needed for moderate pain. 90 tablet Eustace MooreNelson, Yvonne Sue, MD   HYDROcodone-acetaminophen [facility](NORCO) 7.5-325 MG tablet Take 1 tablet by mouth every 6 (six) hours as needed for severe pain. 15 tablet Eustace MooreNelson, Yvonne Sue, MD     Controlled Substance Prescriptions [place] Controlled Substance Registry consulted? Not Applicable   Eustace MooreNelson, Yvonne Sue, MD 01/08/18 2044

## 2018-01-08 NOTE — ED Triage Notes (Signed)
Pt states he recently started working and now his chest has been hurting in the center, states it feels like gas, movement makes it feel better. Pt also c/o toothache.

## 2018-01-19 ENCOUNTER — Telehealth (HOSPITAL_COMMUNITY): Payer: Self-pay | Admitting: Emergency Medicine

## 2018-01-19 NOTE — Telephone Encounter (Signed)
Patient called today and requested that I change the pharmacy on his Norco.  [facility] [place]  I reviewed his record.  He was given the prescription on 01/08/2018 for acute dental pain.  He was supposed to call a dentist the following day.  He was also given ibuprofen.  He has a history of polysubstance abuse although none recently.  I am not comfortable giving him Norco at this late date, I feel like if he needed it would have been the day he was seen and not a week and a half later.  Request to refill the Norco declined.

## 2018-01-23 NOTE — ED Provider Notes (Signed)
[facility]    CSN: [id number] Arrival date & time: 01/08/18  1950     History   Chief Complaint Chief Complaint  Patient presents with  . Chest Pain  . Dental Pain    HPI Frederick Ballard is a [age] y.o. male.   [facility] today requesting a new presctiption fpr norco that was prescribed on 6.28.19. Patient states that he went to the pharmacy to pick up his pain medication and antibiotics the norco was not there. Offered to refill patient antibiotics and order him ibuprofen for pain. Patient repeatedly requesting norco. Patient left and returned to this department with a woman and a dog. Patient and women requesting norco when offering to refill antibiotic prescription both patient and woman declined. Woman escalated when further explantation of dental infections was given. Patient then vegan requesting vicodin stating he was given that by a dentist previously. Patient and women decline treatment at this time and left the facility stating that they would return on Monday when the doctor who ordered the norco was present.     Past Medical History:  Diagnosis Date  . Depression     Patient Active Problem List   Diagnosis Date Noted  . Polysubstance abuse (HCC) 07/24/2011  . Schizoaffective disorder, depressive type (HCC) 07/24/2011    Past Surgical History:  Procedure Laterality Date  . JOINT REPLACEMENT  old basketball injury surrgery on leg.       Home Medications    Prior to Admission medications   Medication Sig Start Date End Date Taking? Authorizing Provider  HYDROcodone-acetaminophen (NORCO) 7.5-325 MG tablet Take 1 tablet by mouth every 6 (six) hours as needed for severe pain. 01/08/18   Eustace MooreNelson, Yvonne Sue, MD  ibuprofen (ADVIL,MOTRIN) 800 MG tablet Take 1 tablet (800 mg total) by mouth every 8 (eight) hours as needed for moderate pain. 01/08/18   Eustace MooreNelson, Yvonne Sue, MD  omeprazole (PRILOSEC) 20 MG capsule Take 1 capsule (20  mg total) by mouth daily. 01/08/18   Eustace MooreNelson, Yvonne Sue, MD    Family History No family history on file.  Social History Social History   Tobacco Use  . Smoking status: Current Every Day Smoker    Packs/day: 1.00    Types: Cigarettes  Substance Use Topics  . Alcohol use: Yes  . Drug use: Yes    Types: Cocaine, Marijuana    Comment: former marijuana user     Allergies   Invega [paliperidone]   Review of Systems Review of Systems   Physical Exam Triage Vital Signs ED Triage Vitals [01/08/18 1958]  Enc Vitals Group     BP (!) 146/99     Pulse Rate 99     Resp 16     Temp 99.4 F (37.4 C)     Temp Source Oral     SpO2 95 %     Weight      Height      Head Circumference      Peak Flow      Pain Score      Pain Loc      Pain Edu?      Excl. in GC?    No data found.  Updated Vital Signs BP (!) 146/99 (BP Location: Right Arm)   Pulse 99   Temp 99.4 F (37.4 C) (Oral)   Resp 16   SpO2 95%   Visual Acuity Right Eye Distance:   Left Eye Distance:  Bilateral Distance:    Right Eye Near:   Left Eye Near:    Bilateral Near:     Physical Exam   UC Treatments / Results  Labs (all labs ordered are listed, but only abnormal results are displayed) Labs Reviewed - No data to display  EKG None  Radiology No results found.  Procedures Procedures (including critical care time)  Medications Ordered in UC Medications - No data to display  Initial Impression / Assessment and Plan / UC Course  I have reviewed the triage vital signs and the nursing notes.  Pertinent labs & imaging results that were available during my care of the patient were reviewed by me and considered in my medical decision making (see chart for details).      Final Clinical Impressions(s) / UC Diagnoses   Final diagnoses:  Pain due to dental caries  Gastroesophageal reflux disease, esophagitis presence not specified     Discharge Instructions     Take the omeprazole  once a day for heartburn/acid reflux Take the ibuprofen 3 x a day with food for dental pain When pain severe may take the hydrocodone Hydrocodone can cause drowsiness, constipation Call dentist Monday    ED Prescriptions    Medication Sig Dispense Auth. Provider   omeprazole (PRILOSEC) 20 MG capsule Take 1 capsule (20 mg total) by mouth daily. 30 capsule Eustace Moore, MD   ibuprofen (ADVIL,MOTRIN) 800 MG tablet Take 1 tablet (800 mg total) by mouth every 8 (eight) hours as needed for moderate pain. 90 tablet Eustace Moore, MD   HYDROcodone-acetaminophen [facility]) 7.5-325 MG tablet Take 1 tablet by mouth every 6 (six) hours as needed for severe pain. 15 tablet Eustace Moore, MD     Controlled Substance Prescriptions [place] Controlled Substance Registry consulted? Not Applicable   Alene Mires, NP 01/23/18 [phone]

## 2018-03-04 ENCOUNTER — Encounter (HOSPITAL_COMMUNITY): Payer: Self-pay | Admitting: Emergency Medicine

## 2018-03-04 ENCOUNTER — Ambulatory Visit (HOSPITAL_COMMUNITY)
Admission: EM | Admit: 2018-03-04 | Discharge: 2018-03-04 | Disposition: A | Discharge status: Home / Self Care | Payer: Self-pay | Attending: Family Medicine | Admitting: Family Medicine

## 2018-03-04 DIAGNOSIS — K0889 Other specified disorders of teeth and supporting structures: Secondary | ICD-10-CM

## 2018-03-04 MED ORDER — NAPROXEN 500 MG PO TABS: 500.0000 mg | ORAL_TABLET | Freq: Two times a day (BID) | ORAL | 0 refills | Status: DC

## 2018-03-04 MED ORDER — KETOROLAC TROMETHAMINE 60 MG/2ML IM SOLN
60.0000 mg | Freq: Once | INTRAMUSCULAR | Status: AC
  Administered 2018-03-04: 60 mg via INTRAMUSCULAR

## 2018-03-04 MED ORDER — KETOROLAC TROMETHAMINE 60 MG/2ML IM SOLN
INTRAMUSCULAR | Status: AC
  Filled 2018-03-04: qty 2

## 2018-03-04 NOTE — ED Provider Notes (Addendum)
[facility]    CSN: [id number] Arrival date & time: 03/04/18  1134     History   Chief Complaint Chief Complaint  Patient presents with  . Dental Pain    HPI Frederick Ballard is a 32 y.o. male.   Pt is a 32 year old male with PMH of polysubstance abuse and schizoaffective disorder. He presents today with chronic dental pain. He has been taking aleve and ibuprofen for relief and it helps. The pain is intermittent but worsening at certain times. No other associated symptoms.      Past Medical History:  Diagnosis Date  . Depression     Patient Active Problem List   Diagnosis Date Noted  . Polysubstance abuse (HCC) 07/24/2011  . Schizoaffective disorder, depressive type (HCC) 07/24/2011    Past Surgical History:  Procedure Laterality Date  . JOINT REPLACEMENT  old basketball injury surrgery on leg.       Home Medications    Prior to Admission medications   Medication Sig Start Date End Date Taking? Authorizing Provider  HYDROcodone-acetaminophen (NORCO) 7.5-325 MG tablet Take 1 tablet by mouth every 6 (six) hours as needed for severe pain. 01/08/18   Eustace Moore, MD  ibuprofen (ADVIL,MOTRIN) 800 MG tablet Take 1 tablet (800 mg total) by mouth every 8 (eight) hours as needed for moderate pain. 01/08/18   Eustace Moore, MD  naproxen (NAPROSYN) 500 MG tablet Take 1 tablet (500 mg total) by mouth 2 (two) times daily. 03/04/18   Dahlia Byes A, NP  omeprazole (PRILOSEC) 20 MG capsule Take 1 capsule (20 mg total) by mouth daily. 01/08/18   Eustace Moore, MD    Family History No family history on file.  Social History Social History   Tobacco Use  . Smoking status: Current Every Day Smoker    Packs/day: 1.00    Types: Cigarettes  Substance Use Topics  . Alcohol use: Yes  . Drug use: Yes    Types: Cocaine, Marijuana    Comment: former marijuana user     Allergies   Invega [paliperidone]   Review of Systems Review of Systems    Constitutional: Negative for activity change and appetite change.  HENT: Positive for dental problem. Negative for facial swelling and mouth sores.   Neurological: Negative for facial asymmetry.  All other systems reviewed and are negative.    Physical Exam Triage Vital Signs ED Triage Vitals [03/04/18 1151]  Enc Vitals Group     BP (!) 143/89     Pulse Rate 69     Resp 16     Temp 98.4 F (36.9 C)     Temp Source Temporal     SpO2 100 %     Weight      Height      Head Circumference      Peak Flow      Pain Score      Pain Loc      Pain Edu?      Excl. in GC?    No data found.  Updated Vital Signs BP (!) 143/89 (BP Location: Right Arm)   Pulse 69   Temp 98.4 F (36.9 C) (Temporal)   Resp 16   SpO2 100%   Visual Acuity Right Eye Distance:   Left Eye Distance:   Bilateral Distance:    Right Eye Near:   Left Eye Near:    Bilateral Near:     Physical Exam  Constitutional: He is  oriented to person, place, and time. He appears well-developed and well-nourished.  HENT:  Head: Normocephalic and atraumatic.  Nose: Nose normal.  Broken tooth to left lower molar. No swelling, erythema. Non tender to touch. No facial swelling.   Eyes: Pupils are equal, round, and reactive to light.  Neck: Normal range of motion.  Pulmonary/Chest: Effort normal.  Musculoskeletal: Normal range of motion.  Neurological: He is alert and oriented to person, place, and time.  Skin: Skin is warm and dry.  Psychiatric: He has a normal mood and affect.  Nursing note and vitals reviewed.    UC Treatments / Results  Labs (all labs ordered are listed, but only abnormal results are displayed) Labs Reviewed - No data to display  EKG None  Radiology No results found.  Procedures Procedures (including critical care time)  Medications Ordered in UC Medications  ketorolac (TORADOL) injection 60 mg (60 mg Intramuscular Given 03/04/18 1232)    Initial Impression / Assessment and  Plan / UC Course  I have reviewed the triage vital signs and the nursing notes.  Pertinent labs & imaging results that were available during my care of the patient were reviewed by me and considered in my medical decision making (see chart for details).     Chronic dental pain- Toradol injection in clinic.  Continue the aleve an ibuprofen. Follow up with a dentist.  Final Clinical Impressions(s) / UC Diagnoses   Final diagnoses:  Pain, dental     Discharge Instructions     It was nice meeting you!!  We will give a Toradol injection for the pain. Dental resources at handout.  Naproxen for pain at home.     ED Prescriptions    Medication Sig Dispense Auth. Provider   naproxen (NAPROSYN) 500 MG tablet Take 1 tablet (500 mg total) by mouth 2 (two) times daily. 30 tablet Dahlia ByesBast, Burrel Legrand A, NP     Controlled Substance Prescriptions [place] Controlled Substance Registry consulted? Not Applicable       Janace ArisBast, Chantea Surace A, NP 03/04/18 1244

## 2018-03-04 NOTE — ED Triage Notes (Signed)
Pt c/o dental pain for a couple months, L lower back wisdom tooth.

## 2018-03-04 NOTE — Discharge Instructions (Addendum)
It was nice meeting you!!  We will give a Toradol injection for the pain. Dental resources at handout.  Naproxen for pain at home.

## 2018-03-31 IMAGING — DX DG CHEST 2V
2 series · 2 of 2 positions shown · non-contrast
Comparison: Chest radiograph 09/06/2011.

CLINICAL DATA: Patient with cough and congestion for 2 days.

EXAM:
CHEST  2 VIEW

[chest pa]
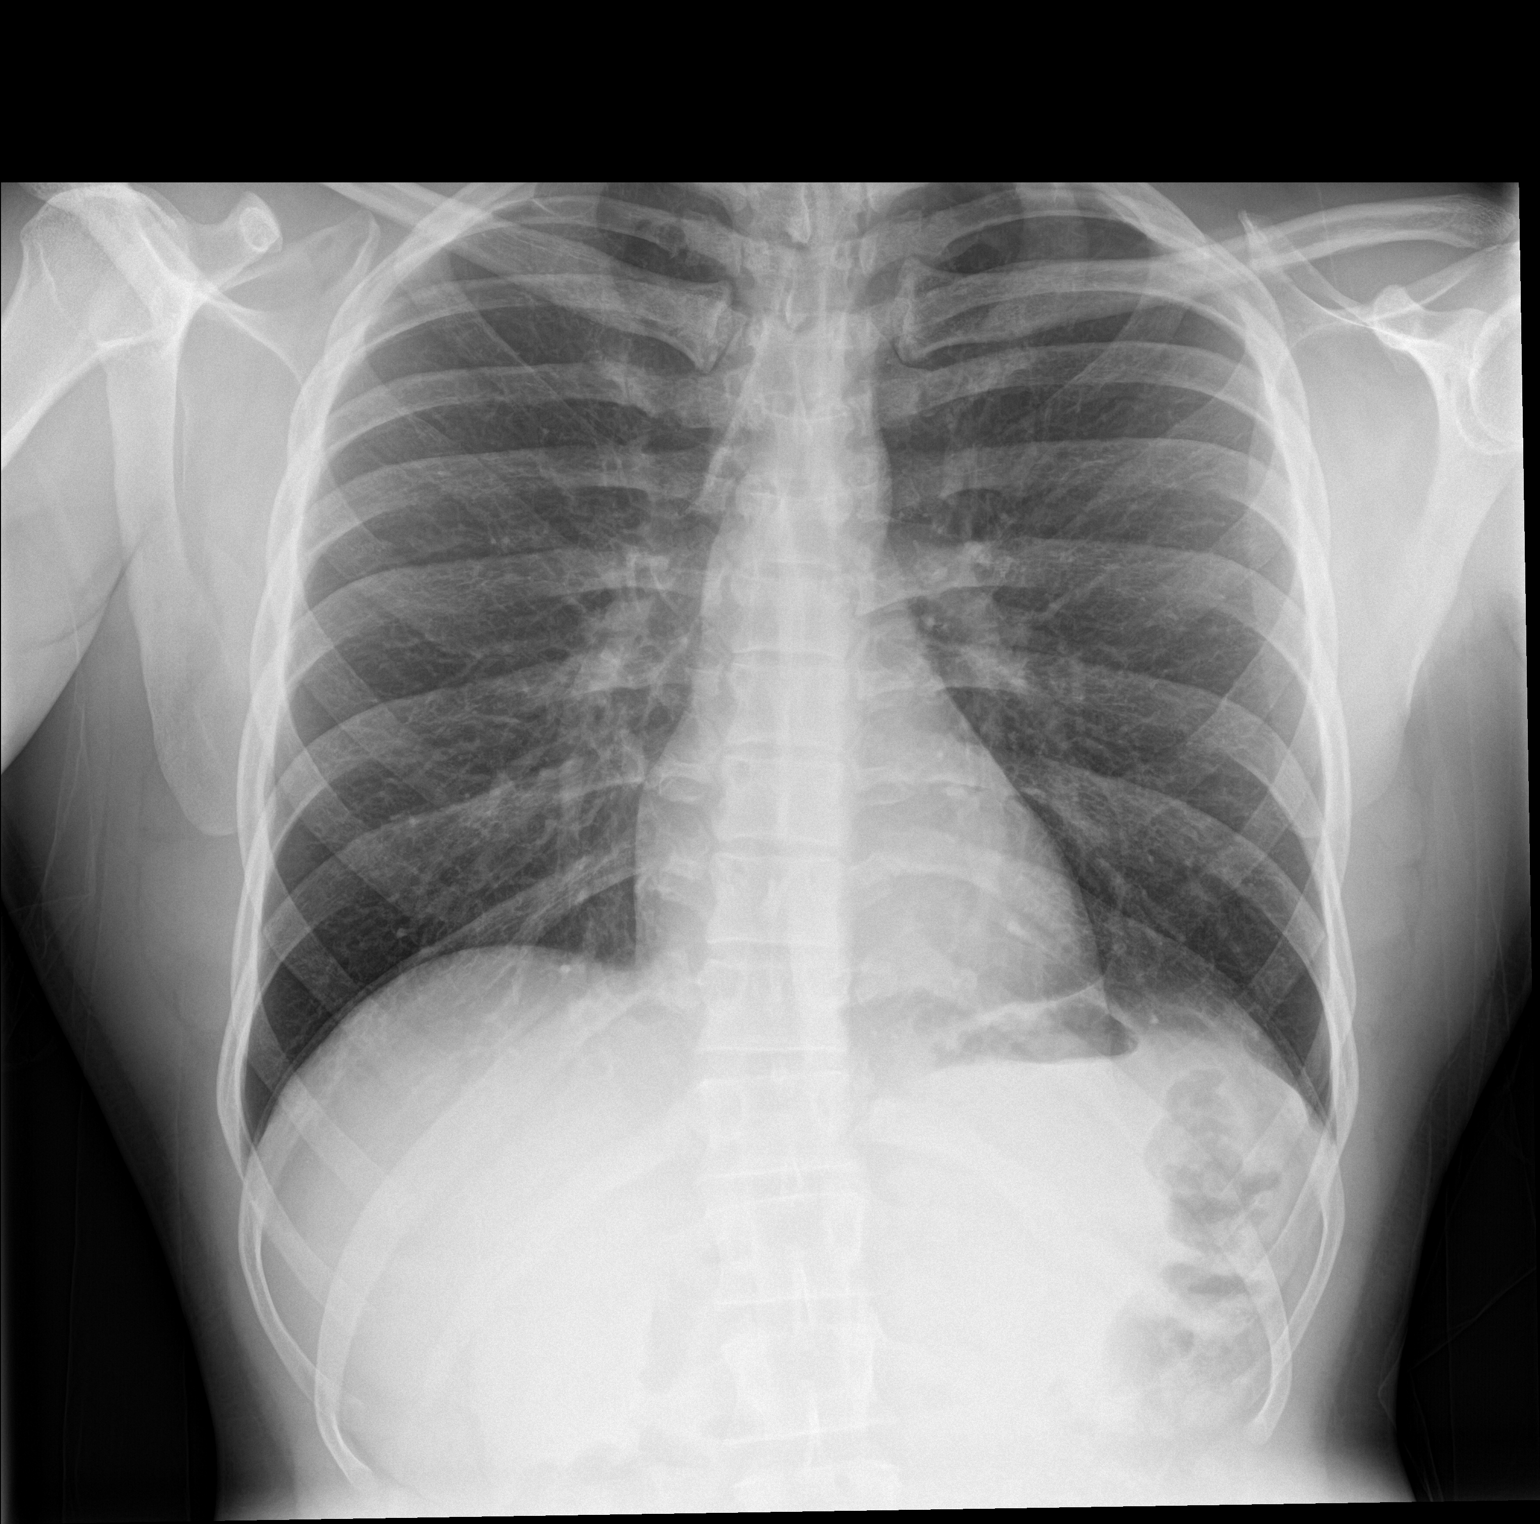

[chest lat]
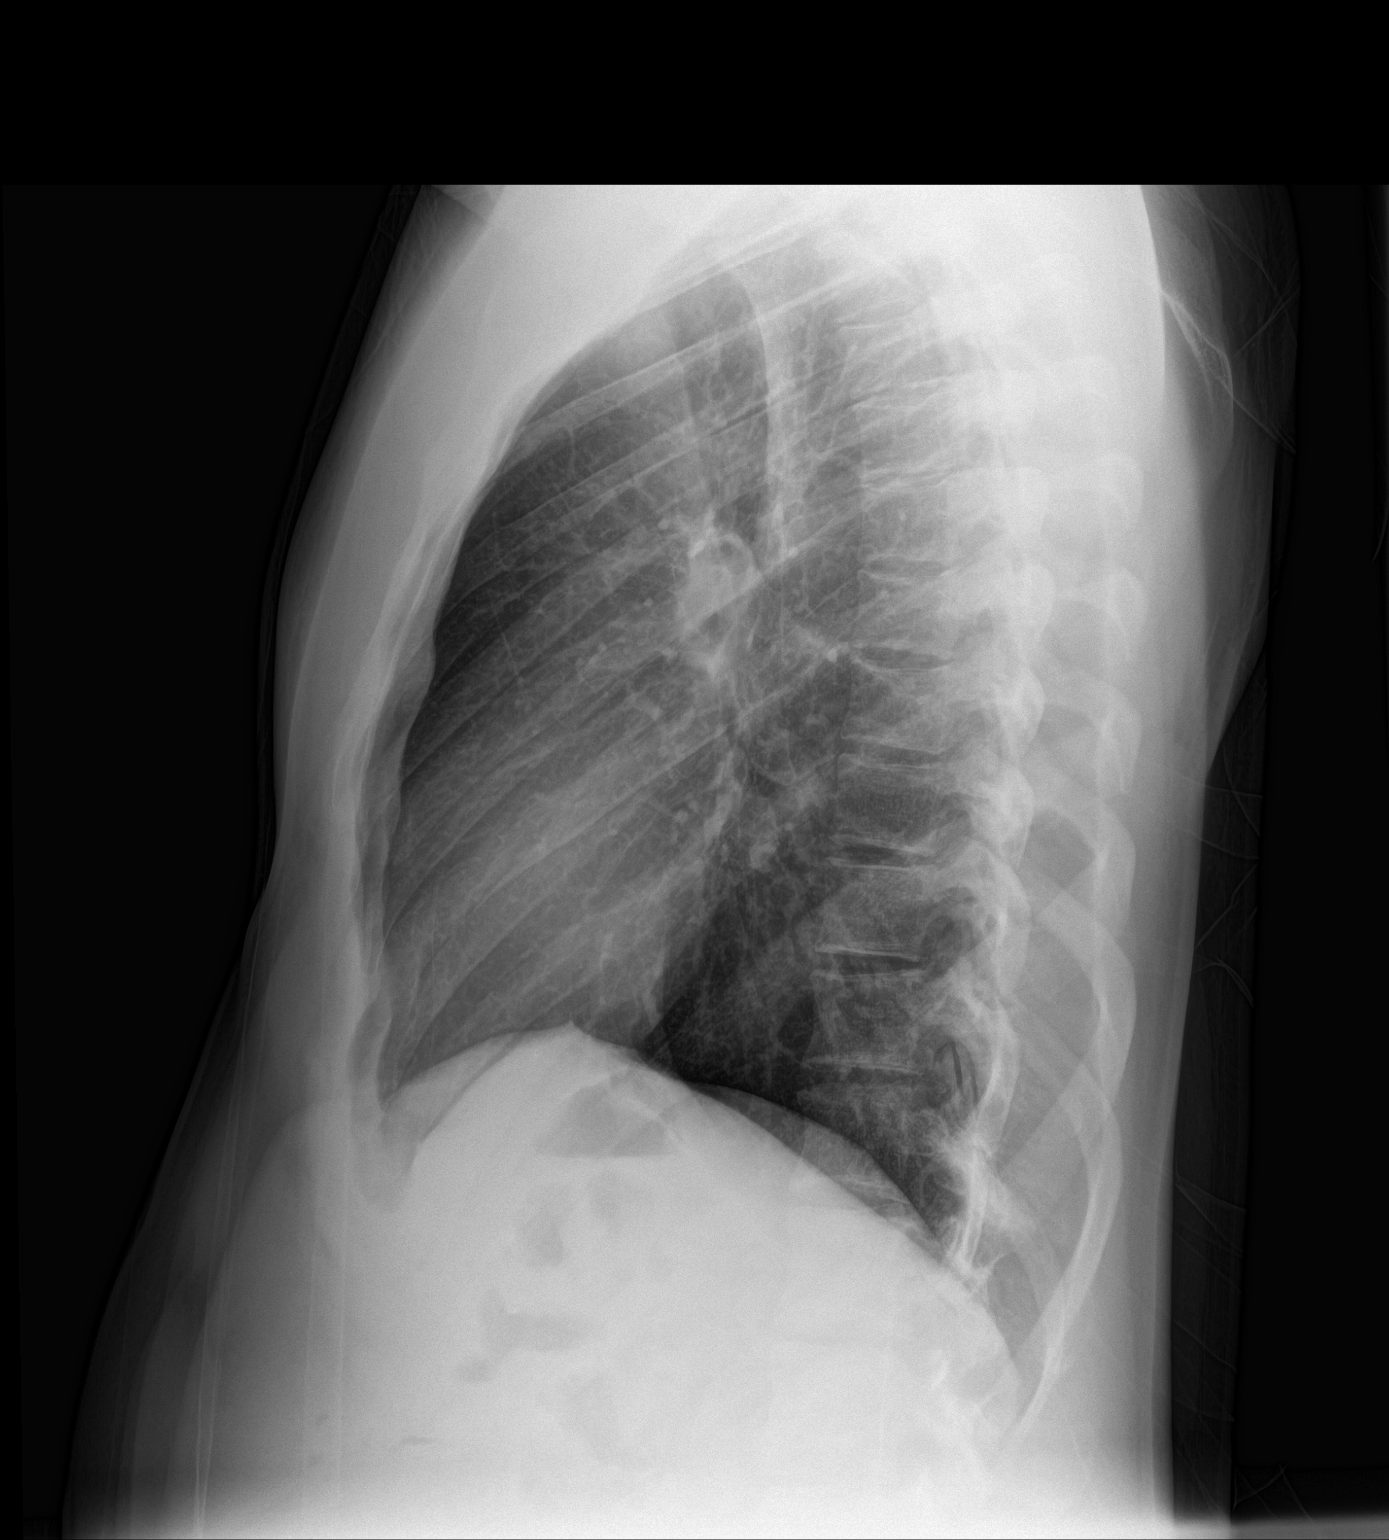

[2 of 2 positions shown; findings below may reference images not displayed]

FINDINGS: Stable cardiac and mediastinal contours. No consolidative pulmonary
opacities. No pleural effusion or pneumothorax. Regional skeleton is
unremarkable.
IMPRESSION: No active cardiopulmonary disease.

## 2018-06-17 ENCOUNTER — Encounter (HOSPITAL_COMMUNITY): Payer: Self-pay | Admitting: Emergency Medicine

## 2018-06-17 ENCOUNTER — Emergency Department (HOSPITAL_COMMUNITY)
Admission: EM | Admit: 2018-06-17 | Discharge: 2018-06-17 | Disposition: A | Discharge status: Home / Self Care | Payer: Self-pay | Attending: Emergency Medicine | Admitting: Emergency Medicine

## 2018-06-17 DIAGNOSIS — F17210 Nicotine dependence, cigarettes, uncomplicated: Secondary | ICD-10-CM | POA: Insufficient documentation

## 2018-06-17 DIAGNOSIS — Z79899 Other long term (current) drug therapy: Secondary | ICD-10-CM | POA: Insufficient documentation

## 2018-06-17 DIAGNOSIS — F1020 Alcohol dependence, uncomplicated: Secondary | ICD-10-CM | POA: Insufficient documentation

## 2018-06-17 DIAGNOSIS — F1910 Other psychoactive substance abuse, uncomplicated: Secondary | ICD-10-CM | POA: Insufficient documentation

## 2018-06-17 NOTE — Discharge Instructions (Addendum)
Please read attached information. If you experience any new or worsening signs or symptoms please return to the emergency room for evaluation. Please follow-up with your primary care provider or specialist as discussed.  °

## 2018-06-17 NOTE — ED Provider Notes (Signed)
[facility] EMERGENCY [facility]PARTMENT Provider Note   CSN: [id number] Arrival date & time: 06/17/18  [id number]     History   Chief Complaint Chief Complaint  Patient presents with  . Detox    HPI Mardene SpeakMorgan W Chasteen is a 32 y.o. male.  HPI   32 year old male presents today for resources for alcohol and substance abuse detox.  Patient notes that he abuses cocaine and alcohol noting he drinks and uses drugs whenever he has the opportunity or the money.  Patient notes that he drinks every day and it is very rare that he does not have alcohol.  Patient notes that is significantly affecting his life and would like treatment.  He notes he has been treated as an inpatient in the past in [place].  Patient notes his last alcohol intake was yesterday.  He denies any history of significant physical manifestations of detoxification from alcohol including seizures.  Patient denies any physical complaints today.  Past Medical History:  Diagnosis Date  . Depression     Patient Active Problem List   Diagnosis Date Noted  . Polysubstance abuse (HCC) 07/24/2011  . Schizoaffective disorder, depressive type (HCC) 07/24/2011    Past Surgical History:  Procedure Laterality Date  . JOINT REPLACEMENT  old basketball injury surrgery on leg.        Home Medications    Prior to Admission medications   Medication Sig Start Date End Date Taking? Authorizing Provider  HYDROcodone-acetaminophen (NORCO) 7.5-325 MG tablet Take 1 tablet by mouth every 6 (six) hours as needed for severe pain. 01/08/18   Eustace MooreNelson, Yvonne Sue, MD  ibuprofen (ADVIL,MOTRIN) 800 MG tablet Take 1 tablet (800 mg total) by mouth every 8 (eight) hours as needed for moderate pain. 01/08/18   Eustace MooreNelson, Yvonne Sue, MD  naproxen (NAPROSYN) 500 MG tablet Take 1 tablet (500 mg total) by mouth 2 (two) times daily. 03/04/18   Dahlia ByesBast, Traci A, NP  omeprazole (PRILOSEC) 20 MG capsule Take 1 capsule (20 mg total) by mouth daily. 01/08/18    Eustace MooreNelson, Yvonne Sue, MD    Family History No family history on file.  Social History Social History   Tobacco Use  . Smoking status: Current Every Day Smoker    Packs/day: 1.00    Types: Cigarettes  Substance Use Topics  . Alcohol use: Yes    Comment: 40oz. daily  . Drug use: Yes    Types: Cocaine, Marijuana    Comment: former marijuana user     Allergies   Invega [paliperidone]   Review of Systems Review of Systems  All other systems reviewed and are negative.    Physical Exam Updated Vital Signs BP 125/77 (BP Location: Right Arm)   Pulse 69   Temp 98.7 F (37.1 C) (Oral)   Resp 16   SpO2 99%   Physical Exam  Constitutional: He is oriented to person, place, and time. He appears well-developed and well-nourished.  No signs of anxiety  HENT:  Head: Normocephalic and atraumatic.  Eyes: Pupils are equal, round, and reactive to light. Conjunctivae are normal. Right eye exhibits no discharge. Left eye exhibits no discharge. No scleral icterus.  Neck: Normal range of motion. No JVD present. No tracheal deviation present.  Pulmonary/Chest: Effort normal. No stridor.  Neurological: He is alert and oriented to person, place, and time. Coordination normal.  No tremors noted  Psychiatric: He has a normal mood and affect. His behavior is normal. Judgment and thought content normal.  Nursing note  and vitals reviewed.    ED Treatments / Results  Labs (all labs ordered are listed, but only abnormal results are displayed) Labs Reviewed - No data to display  EKG None  Radiology No results found.  Procedures Procedures (including critical care time)  Medications Ordered in ED Medications - No data to display   Initial Impression / Assessment and Plan / ED Course  I have reviewed the triage vital signs and the nursing notes.  Pertinent labs & imaging results that were available during my care of the patient were reviewed by me and considered in my medical  decision making (see chart for details).     32 year old male presents today for resources for alcohol and drug detox.  Patient given resources he has no signs of withdrawal here today.  Return precautions given.  He verbalized understanding and agreement to today's plan.  Final Clinical Impressions(s) / ED Diagnoses   Final diagnoses:  Polysubstance abuse [facility])    ED Discharge Orders    None       Eyvonne Mechanic, PA-C 06/17/18 1101    Pricilla Loveless, MD 06/18/18 [phone]

## 2018-06-17 NOTE — ED Triage Notes (Signed)
Patient to ED requesting detox resources. Typically drinks 40oz. ETOH daily, last drink last night. Patient at ease, denies any symptoms at this time. No SI/HI.

## 2018-06-17 NOTE — ED Notes (Signed)
ED Provider at bedside. 

## 2019-02-16 ENCOUNTER — Other Ambulatory Visit: Payer: Self-pay

## 2019-02-16 DIAGNOSIS — Z20822 Contact with and (suspected) exposure to covid-19: Secondary | ICD-10-CM

## 2019-02-17 LAB — NOVEL CORONAVIRUS, NAA: SARS-CoV-2, NAA: NOT DETECTED

## 2019-11-02 ENCOUNTER — Encounter: Payer: Self-pay | Admitting: *Deleted

## 2019-11-02 NOTE — Congregational Nurse Program (Signed)
  Dept: [phone]   Congregational Nurse Program Note  Date of Encounter: 11/02/2019  Past Medical History: Past Medical History:  Diagnosis Date  . Depression     Encounter Details: CNP Questionnaire - 11/02/19 1334      Questionnaire   Patient Status  Not Applicable    Race  Black or African American    Location Patient Served At  [employer]  Not Applicable    Uninsured  Uninsured (NEW 1x/quarter)    Food  No food insecurities    Housing/Utilities  Yes, have permanent housing   staying with grandmother   Transportation  No transportation needs    Interpersonal Safety  Yes, feel physically and emotionally safe where you currently live    Medication  No medication insecurities    Medical Provider  No    Referrals  Behavioral/Mental Health Provider   [facility] referred to Lavinia Sharps NP   ED Visit Averted  Not Applicable    Life-Saving Intervention Made  Not Applicable      Pt came to [facility] to talk with [facility] about getting set up with provider Lavinia Sharps NP. [facility] asked writer to speak with client. Client reports he has had passive thoughts of wishing he was dead. He has no specific si or plan. Pt denies hi. He is able to carry on conversation and denies command hallucinations. Pt does report racing thoughts, anxiety and difficulty sleeping sometimes. He has tried melatonin and it is not working. Client says he is in the process of joining the [employer]. Spoke with client about mental health services and referred to [facility]. He is not interested at this time. He does plan to see the NP. Explained that he could call 911 or go to [facility] for an assessment if he started having suicidal thoughts or needed mental health services. Client acknowledges understanding.

## 2021-01-16 ENCOUNTER — Other Ambulatory Visit: Payer: Self-pay

## 2021-01-16 ENCOUNTER — Ambulatory Visit (HOSPITAL_COMMUNITY)
Admission: EM | Admit: 2021-01-16 | Discharge: 2021-01-16 | Disposition: A | Discharge status: Home / Self Care | Payer: No Payment, Other | Attending: Registered Nurse | Admitting: Registered Nurse

## 2021-01-16 DIAGNOSIS — F1424 Cocaine dependence with cocaine-induced mood disorder: Secondary | ICD-10-CM | POA: Insufficient documentation

## 2021-01-16 DIAGNOSIS — F1010 Alcohol abuse, uncomplicated: Secondary | ICD-10-CM

## 2021-01-16 DIAGNOSIS — F1420 Cocaine dependence, uncomplicated: Secondary | ICD-10-CM

## 2021-01-16 DIAGNOSIS — F251 Schizoaffective disorder, depressive type: Secondary | ICD-10-CM

## 2021-01-16 DIAGNOSIS — F1994 Other psychoactive substance use, unspecified with psychoactive substance-induced mood disorder: Secondary | ICD-10-CM

## 2021-01-16 DIAGNOSIS — F1910 Other psychoactive substance abuse, uncomplicated: Secondary | ICD-10-CM

## 2021-01-16 NOTE — BH Assessment (Signed)
TTS triage: Patient presents to [facility] for evaluation.He reports he has been home from the [employer] for 2 weeks and is struggling with returning to civilian life. He denies current SI/HI/AVH. He endorses passive HI earlier this date. He denies AVH. He denies any recent SA but states he has been using alcohol and cocaine a couple times per week.  Patient is routine.

## 2021-01-16 NOTE — Discharge Instructions (Addendum)
Any [facility]:  Is first come first serve.  Below are a list of [facility] that has beds available at this time.  There is no guarantee that there will be a bed available when you get there as stated (walk in and first come first serve [facility] Address:  [address], [place], [address] Phone: [phone]   Substance Abuse Resources  [facility] - Admissions are currently completed Monday through Friday at 8am; both appointments and walk-ins are accepted.  Any individual that is a [facility] resident may present for a substance abuse screening and assessment for admission.  A person may be referred by numerous sources or self-refer.   Potential clients will be screened for medical necessity and appropriateness for the program.  Clients must meet criteria for high-intensity residential treatment services.  If clinically appropriate, a client will continue with the comprehensive clinical assessment and intake process, as well as enrollment in the [facility].   Address: [address] [place], [place] [address] Admin Hours: Mon-Fri 8AM to [facility] Hours: 24/7 Phone: [phone] Fax: [phone]   [facility] (Detox) Facility Based Crisis:  These are 3 locations for services: Please call before arrival    Address: [address] [place], [place] [address] Phone: [phone]   Address: [address] [place], [place] [address] Phone#: [phone]   Address: [address] [place], [place] [address] Phone#: [phone]     [facility]): (offers outpatient therapy and intensive outpatient substance abuse therapy).  [address] [place], [place] [address] Phone: [phone]   [facility]: Offers FREE recovery skills classes, support groups, 1:1 Peer Support, and Compeer Classes. [address], [place], [place]  [address] Phone: [phone] (Call to complete intake).  [facility] [address] [place], [place] [address] Phone: [phone] ext: [phone] [facility] provides food, shelter and other programs and services to the homeless men of [place] through our [employer].   By offering safe shelter, three meals a day, clean clothing, Biblical counseling, financial planning, vocational training, GED/education and employment assistance, we've helped mend the shattered lives of many homeless men since opening in 1974.   We have approximately 267 beds available, with a max of 312 beds including mats for emergency situations and currently house an average of 270 men a night.   Prospective Client Check-In Information Photo ID Required (State/ Out of State/ [facility]) - if photo ID is not available, clients are required to have a printout of a police/sheriff's criminal history report. Help out with chores around the [facility]. No sex offender of any type (pending, charged, registered and/or any other sex related offenses) will be permitted to check in. Must be willing to abide by all rules, regulations, and policies established by the [employer]. The following will be provided - shelter, food, clothing, and biblical counseling. If you or someone you know is in need of assistance at our [facility] shelter in [place], [place], please call [phone] ext. [phone].   [facility]-will provide timely access to mental health services for children and adolescents (4-17) and adults presenting in a mental health crisis. The program is designed for those who need urgent Behavioral Health or Substance Use treatment and are not experiencing a medical crisis that would typically require an emergency room visit.  [address] [place], [place] [address] Phone: [phone] [email or web address]   [facility]: Phone#:  [phone]   The [facility] SA Intensive Outpatient Program (SAIOP) means structured individual and group addiction activities and services that are provided at an outpatient program designed to assist adult and adolescent consumers to begin recovery and learn skills for recovery maintenance. The [facility] SAIOP program is offered at least 3 hours a day, 3 days a week.SAIOP services shall include a structured program consisting of, but not limited to, the following services: Individual counseling and support; Group counseling and support; Family counseling, training or support; Biochemical assays to identify recent drug use (e.g., urine drug screens); Strategies for relapse prevention to include community and social support systems in treatment; Life skills; Crisis contingency planning; Disease Management; and Treatment support activities that have been adapted or specifically designed for persons with physical disabilities, or persons with co-occurring disorders of mental illness and substance abuse/dependence or mental retardation/developmental disability and substance abuse/dependence. Phone: [phone]   [facility] 24-Hour Call Center: [phone]  Behavioral Health Crisis Line: [phone]

## 2021-01-16 NOTE — ED Provider Notes (Signed)
Behavioral Health Urgent Care Medical Screening Exam  Patient Name: Frederick Ballard MRN: [id number] Date of Evaluation: 01/16/21 Chief Complaint:   Diagnosis:  Final diagnoses:  Substance induced mood disorder (HCC)  Polysubstance abuse (HCC)  Alcohol abuse  Cocaine use disorder, moderate, in controlled environment (HCC)  Schizoaffective disorder, depressive type (HCC)    History of Present illness: Frederick Ballard is a 35 y.o. male patient presented to [facility] as a walk in with complaints of daily alcohol use and cocaine 2 x a week seeking detox / rehab service  Frederick Ballard, 35 y.o., male patient seen face to face by this provider, consulted with Dr. Earlene Plater; and chart reviewed on 01/16/21.  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  States he done better before coming back home and wants to get clean so he can go active.  Patient denies suicidal/self-harm/homicidal ideation, psychosis, and paranoia.  Patient lives with his mother and a roommate whom he states "They can see things that I can about me.  Wanting me to get my life together.  Patient states that he doesn't currently have outpatient psychiatric services but is interested in substance use services whether rehab or outpatient.   During evaluation Frederick Ballard is sitting up right in chair in no acute distress.  He is alert, oriented x 4, calm, cooperative, and his mood is euthymic with congruent affect.  He does not appear to be responding to internal/external stimuli or delusional thoughts.  Patient denies suicidal/self-harm/homicidal ideation, psychosis, and paranoia.  Patient answered question appropriately.     Psychiatric Specialty Exam  Presentation  General Appearance:Appropriate for Environment; Casual  Eye Contact:Good  Speech:Clear and Coherent;  Normal Rate  Speech Volume:Normal  Handedness:Right   Mood and Affect  Mood:Euthymic  Affect:Appropriate; Congruent   Thought Process  Thought Processes:Coherent; Goal Directed  Descriptions of Associations:Intact  Orientation:Full (Time, Place and Person)  Thought Content:Logical; WDL    Hallucinations:None  Ideas of Reference:None  Suicidal Thoughts:No  Homicidal Thoughts:No   Sensorium  Memory:Immediate Good; Recent Good  Judgment:Intact  Insight:Good; Present   Executive Functions  Concentration:Good  Attention Span:Good  Recall:Good  Fund of Knowledge:Good  Language:Good   Psychomotor Activity  Psychomotor Activity:Normal   Assets  Assets:Communication Skills; Desire for Improvement; Housing; Resilience; Social Support   Sleep  Sleep:Good  Number of hours:  No data recorded  Nutritional Assessment (For OBS and FBC admissions only) Has the patient had a weight loss or gain of 10 pounds or more in the last 3 months?: No Has the patient had a decrease in food intake/or appetite?: No Does the patient have dental problems?: No Does the patient have eating habits or behaviors that may be indicators of an eating disorder including binging or inducing vomiting?: No Has the patient recently lost weight without trying?: No Has the patient been eating poorly because of a decreased appetite?: No Malnutrition Screening Tool Score: 0    Physical Exam: Physical Exam Vitals and nursing note reviewed. Exam conducted with a chaperone present.  Constitutional:      General: He is not in acute distress.    Appearance: Normal appearance. He is not ill-appearing.  Cardiovascular:     Rate and Rhythm: Normal rate.  Pulmonary:     Effort: Pulmonary effort is normal.  Musculoskeletal:  General: Normal range of motion.     Cervical back: Normal range of motion.  Skin:    General: Skin is warm and dry.  Neurological:     Mental Status: He is  alert and oriented to person, place, and time.  Psychiatric:        Attention and Perception: Attention and perception normal. He does not perceive auditory or visual hallucinations.        Mood and Affect: Mood and affect normal.        Speech: Speech normal.        Behavior: Behavior normal. Behavior is cooperative.        Thought Content: Thought content normal. Thought content is not paranoid or delusional. Thought content does not include homicidal or suicidal ideation.        Cognition and Memory: Cognition and memory normal.        Judgment: Judgment normal.   Review of Systems  Constitutional: Negative.   HENT: Negative.    Eyes: Negative.   Respiratory: Negative.    Cardiovascular: Negative.   Gastrointestinal: Negative.   Genitourinary: Negative.   Musculoskeletal: Negative.   Skin: Negative.   Neurological: Negative.   Endo/Heme/Allergies: Negative.   Psychiatric/Behavioral:  Substance abuse: Daily alcohol use and cocaine twice weekly.   Blood pressure 120/79, pulse 66, temperature 98.4 F (36.9 C), temperature source Oral, resp. rate 16, SpO2 99 %. There is no height or weight on file to calculate BMI.  Musculoskeletal: Strength & Muscle Tone: within normal limits Gait & Station: normal Patient leans: N/A   [facility] MSE Discharge Disposition for Follow up and Recommendations: Based on my evaluation the patient does not appear to have an emergency medical condition and can be discharged with resources and follow up care in outpatient services for Medication Management, Substance Abuse Intensive Outpatient Program, Individual Therapy, and Group Therapy   While attempting to check bed availability at local rehab facilities patient left AMA.  Attempted to call but no answer.  Will print AVS and leave up front for patient if he comes back.     Follow-up Information     Inc, [employer].   Why: Currently have beds available; but first come first serve Contact  information: [address] [place] [place] [address] [phone]                  Discharge Instructions      Any [facility]:  Is first come first serve.  Below are a list of [facility] that has beds available at this time.  There is no guarantee that there will be a bed available when you get there as stated (walk in and first come first serve [facility] Address:  [address], [place], [address] Phone: [phone]   Substance Abuse Resources  [facility] - Admissions are currently completed Monday through Friday at 8am; both appointments and walk-ins are accepted.  Any individual that is a [facility] resident may present for a substance abuse screening and assessment for admission.  A person may be referred by numerous sources or self-refer.   Potential clients will be screened for medical necessity and appropriateness for the program.  Clients must meet criteria for high-intensity residential treatment services.  If clinically appropriate, a client will continue with the comprehensive clinical assessment and intake process, as well as enrollment in the [facility].   Address: [address] [place], [place]  [id number] Admin Hours: Mon-Fri 8AM to 5PM Center Hours: 24/7 Phone: [phone] Fax: [phone]   [facility] (Detox) Facility Based Crisis:  These are 3 locations for services: Please call before arrival    Address: [address] [place], [place] [address] Phone: [phone]   Address: [address], [place], [place] [address] Phone#: [phone]   Address: [address], [place], [place] [address] Phone#: [phone]     [facility] ([facility]): (offers outpatient therapy and intensive outpatient substance abuse therapy).  [address], [place], [place] [address] Phone: [phone]   [facility]: Offers  FREE recovery skills classes, support groups, 1:1 Peer Support, and Compeer Classes. [address], [place], [place] [address] Phone: [phone] (Call to complete intake).  [facility] [address] [place], [place] [address] Phone: [phone] ext: [phone] The [facility] provides food, shelter and other programs and services to the homeless men of [place] through our [facility] [facility] [employer] [facility].   By offering safe shelter, three meals a day, clean clothing, Biblical counseling, financial planning, vocational training, GED/education and employment assistance, we've helped mend the shattered lives of many homeless men since opening in 1974.   We have approximately 267 beds available, with a max of 312 beds including mats for emergency situations and currently house an average of 270 men a night.   Prospective Client Check-In Information Photo ID Required (State/ Out of State/ [facility]) - if photo ID is not available, clients are required to have a printout of a police/sheriff's criminal history report. Help out with chores around the Mission. No sex offender of any type (pending, charged, registered and/or any other sex related offenses) will be permitted to check in. Must be willing to abide by all rules, regulations, and policies established by the [facility]. The following will be provided - shelter, food, clothing, and biblical counseling. If you or someone you know is in need of assistance at our [facility] in [place], [place], please call [phone] ext. [phone].   [facility]will provide timely access to mental health services for children and adolescents (4-17) and adults presenting in a mental health crisis. The program is designed for those who need urgent Behavioral Health or Substance Use treatment and are not experiencing a medical crisis that would typically require an emergency room visit.    [address] [place], [place] [address] Phone: [phone] [email or web address]   [facility]: Phone#: [phone]   The [facility] SA Intensive Outpatient Program (SAIOP) means structured individual and group addiction activities and services that are provided at an outpatient program designed to assist adult and adolescent consumers to begin recovery and learn skills for recovery maintenance. The [facility] SAIOP program is offered at least 3 hours a day, 3 days a week.SAIOP services shall include a structured program consisting of, but not limited to, the following services: Individual counseling and support; Group counseling and support; Family counseling, training or support; Biochemical assays to identify recent drug use (e.g., urine drug screens); Strategies for relapse prevention to include community and social support systems in treatment; Life skills; Crisis contingency planning; Disease Management; and Treatment support activities that have been adapted or specifically designed for persons with physical disabilities, or persons with co-occurring disorders of mental illness and substance abuse/dependence or mental retardation/developmental disability and substance abuse/dependence. Phone: [phone]   [facility] 24-Hour Call Center:  [phone]  Behavioral Health Crisis Line: [phone]          Other resource handouts given also  Kennita Pavlovich, NP 01/16/2021, 3:26 PM

## 2021-01-16 NOTE — Discharge Summary (Signed)
Pt left facility after being triaged and was seen by the provider. Pt did not wait for his AVS. Pt is discharged at this time.

## 2021-07-16 ENCOUNTER — Ambulatory Visit (HOSPITAL_COMMUNITY): Payer: No Payment, Other | Admitting: Licensed Clinical Social Worker

## 2021-07-24 ENCOUNTER — Ambulatory Visit (HOSPITAL_COMMUNITY): Payer: No Payment, Other | Admitting: Physician Assistant

## 2023-03-11 ENCOUNTER — Encounter (HOSPITAL_COMMUNITY): Payer: Self-pay

## 2023-03-11 ENCOUNTER — Emergency Department (HOSPITAL_COMMUNITY)
Admission: EM | Admit: 2023-03-11 | Discharge: 2023-03-11 | Disposition: A | Discharge status: Home / Self Care | Payer: 59 | Attending: Emergency Medicine | Admitting: Emergency Medicine

## 2023-03-11 DIAGNOSIS — F1410 Cocaine abuse, uncomplicated: Secondary | ICD-10-CM | POA: Diagnosis present

## 2023-03-11 DIAGNOSIS — Z5900 Homelessness unspecified: Secondary | ICD-10-CM | POA: Insufficient documentation

## 2023-03-11 NOTE — ED Provider Notes (Signed)
[place] EMERGENCY DEPARTMENT AT [facility] Provider Note   CSN: [id number] Arrival date & time: 03/11/23  1478     History  No chief complaint on file.   Frederick Ballard is a 37 y.o. male.  Pt is a 37 yo male with pmhx significant for substance abuse. Pt said he's been abusing crack for years.  He wants to quit and comes to the ED to see about getting detox.  Pt said his family has kicked him out and he is living in his car.  He has no other sx.       Home Medications Prior to Admission medications   Medication Sig Start Date End Date Taking? Authorizing Provider  HYDROcodone-acetaminophen (NORCO) 7.5-325 MG tablet Take 1 tablet by mouth every 6 (six) hours as needed for severe pain. 01/08/18   Eustace Moore, MD  ibuprofen (ADVIL,MOTRIN) 800 MG tablet Take 1 tablet (800 mg total) by mouth every 8 (eight) hours as needed for moderate pain. 01/08/18   Eustace Moore, MD  naproxen (NAPROSYN) 500 MG tablet Take 1 tablet (500 mg total) by mouth 2 (two) times daily. 03/04/18   Dahlia Byes A, NP  omeprazole (PRILOSEC) 20 MG capsule Take 1 capsule (20 mg total) by mouth daily. 01/08/18   Eustace Moore, MD      Allergies    Hinda Glatter [paliperidone]    Review of Systems   Review of Systems  All other systems reviewed and are negative.   Physical Exam Updated Vital Signs BP 128/71   Pulse 62   Resp 19   Ht 5\' 6"  (1.676 m)   Wt 70.3 kg   SpO2 100%   BMI 25.02 kg/m  Physical Exam Vitals and nursing note reviewed.  Constitutional:      Appearance: Normal appearance.  HENT:     Head: Normocephalic and atraumatic.     Right Ear: External ear normal.     Left Ear: External ear normal.     Nose: Nose normal.     Mouth/Throat:     Mouth: Mucous membranes are moist.     Pharynx: Oropharynx is clear.  Eyes:     Extraocular Movements: Extraocular movements intact.     Conjunctiva/sclera: Conjunctivae normal.     Pupils: Pupils are equal, round, and  reactive to light.  Cardiovascular:     Rate and Rhythm: Normal rate and regular rhythm.     Pulses: Normal pulses.     Heart sounds: Normal heart sounds.  Pulmonary:     Effort: Pulmonary effort is normal.     Breath sounds: Normal breath sounds.  Abdominal:     General: Abdomen is flat. Bowel sounds are normal.     Palpations: Abdomen is soft.  Musculoskeletal:        General: Normal range of motion.     Cervical back: Normal range of motion and neck supple.  Skin:    General: Skin is warm and dry.     Capillary Refill: Capillary refill takes less than 2 seconds.  Neurological:     General: No focal deficit present.     Mental Status: He is alert and oriented to person, place, and time.  Psychiatric:        Mood and Affect: Mood normal.        Behavior: Behavior normal.     ED Results / Procedures / Treatments   Labs (all labs ordered are listed, but only abnormal results are displayed)  Labs Reviewed - No data to display  EKG None  Radiology No results found.  Procedures Procedures    Medications Ordered in ED Medications - No data to display  ED Course/ Medical Decision Making/ A&P                                 Medical Decision Making  Unfortunately, we don't admit patients here for cocaine detox.  Pt is d/c with resources for shelters and for treatment programs.  He is stable for d/c.  Return for any concerns.        Final Clinical Impression(s) / ED Diagnoses Final diagnoses:  Cocaine abuse [facility])  Homeless    Rx / DC Orders ED Discharge Orders     None         Jacalyn Lefevre, MD 03/11/23 [phone]

## 2023-03-11 NOTE — ED Triage Notes (Signed)
Pt states he has been doing drugs for years and is ready to quit, he has lost everything, is homeless and slept in car last night. Pt reports he uses crack cocaine, last use late last night, early this morning.

## 2023-03-27 ENCOUNTER — Ambulatory Visit (HOSPITAL_COMMUNITY)
Admission: EM | Admit: 2023-03-27 | Discharge: 2023-03-27 | Disposition: A | Discharge status: Other Acute Inpatient Hospital | Payer: 59 | Attending: Behavioral Health | Admitting: Behavioral Health

## 2023-03-27 ENCOUNTER — Other Ambulatory Visit (HOSPITAL_COMMUNITY)
Admission: EM | Admit: 2023-03-27 | Discharge: 2023-03-31 | Disposition: A | Discharge status: Home / Self Care | Payer: 59 | Attending: Psychiatry | Admitting: Psychiatry

## 2023-03-27 DIAGNOSIS — F1910 Other psychoactive substance abuse, uncomplicated: Secondary | ICD-10-CM

## 2023-03-27 DIAGNOSIS — F259 Schizoaffective disorder, unspecified: Secondary | ICD-10-CM | POA: Insufficient documentation

## 2023-03-27 DIAGNOSIS — F1010 Alcohol abuse, uncomplicated: Secondary | ICD-10-CM | POA: Diagnosis present

## 2023-03-27 DIAGNOSIS — Z5900 Homelessness unspecified: Secondary | ICD-10-CM | POA: Diagnosis not present

## 2023-03-27 DIAGNOSIS — F1410 Cocaine abuse, uncomplicated: Secondary | ICD-10-CM

## 2023-03-27 DIAGNOSIS — F1090 Alcohol use, unspecified, uncomplicated: Secondary | ICD-10-CM

## 2023-03-27 DIAGNOSIS — F17200 Nicotine dependence, unspecified, uncomplicated: Secondary | ICD-10-CM | POA: Insufficient documentation

## 2023-03-27 DIAGNOSIS — F32A Depression, unspecified: Secondary | ICD-10-CM | POA: Insufficient documentation

## 2023-03-27 DIAGNOSIS — F1020 Alcohol dependence, uncomplicated: Secondary | ICD-10-CM | POA: Diagnosis not present

## 2023-03-27 LAB — COMPREHENSIVE METABOLIC PANEL
ALT: 53 U/L — ABNORMAL HIGH (ref 0–44)
AST: 56 U/L — ABNORMAL HIGH (ref 15–41)
Albumin: 4.4 g/dL (ref 3.5–5.0)
Alkaline Phosphatase: 90 U/L (ref 38–126)
Anion gap: 9 (ref 5–15)
BUN: 12 mg/dL (ref 6–20)
CO2: 26 mmol/L (ref 22–32)
Calcium: 9.4 mg/dL (ref 8.9–10.3)
Chloride: 101 mmol/L (ref 98–111)
Creatinine, Ser: 0.97 mg/dL (ref 0.61–1.24)
GFR, Estimated: >60 mL/min (ref >60)
Glucose, Bld: 77 mg/dL (ref 70–99)
Potassium: 4 mmol/L (ref 3.5–5.1)
Sodium: 136 mmol/L (ref 135–145)
Total Bilirubin: 1 mg/dL (ref 0.3–1.2)
Total Protein: 7.4 g/dL (ref 6.5–8.1)

## 2023-03-27 LAB — LIPID PANEL
Cholesterol: 219 mg/dL — ABNORMAL HIGH (ref 0–200)
HDL: 61 mg/dL (ref >40)
LDL Cholesterol: 118 mg/dL — ABNORMAL HIGH (ref 0–99)
Total CHOL/HDL Ratio: 3.6 ratio
Triglycerides: 198 mg/dL — ABNORMAL HIGH (ref <150)
VLDL: 40 mg/dL (ref 0–40)

## 2023-03-27 LAB — POCT URINE DRUG SCREEN - MANUAL ENTRY (I-SCREEN)
POC Amphetamine UR: NOT DETECTED
POC Buprenorphine (BUP): NOT DETECTED
POC Cocaine UR: POSITIVE — AB
POC Marijuana UR: NOT DETECTED
POC Methadone UR: NOT DETECTED
POC Methamphetamine UR: NOT DETECTED
POC Morphine: NOT DETECTED
POC Oxazepam (BZO): NOT DETECTED
POC Oxycodone UR: NOT DETECTED
POC Secobarbital (BAR): NOT DETECTED

## 2023-03-27 LAB — CBC WITH DIFFERENTIAL/PLATELET
Abs Immature Granulocytes: 0.02 10*3/uL (ref 0.00–0.07)
Basophils Absolute: 0.1 10*3/uL (ref 0.0–0.1)
Basophils Relative: 1 %
Eosinophils Absolute: 0.3 10*3/uL (ref 0.0–0.5)
Eosinophils Relative: 3 %
HCT: 43.5 % (ref 39.0–52.0)
Hemoglobin: 14.5 g/dL (ref 13.0–17.0)
Immature Granulocytes: 0 %
Lymphocytes Relative: 42 %
Lymphs Abs: 3.9 10*3/uL (ref 0.7–4.0)
MCH: 30 pg (ref 26.0–34.0)
MCHC: 33.3 g/dL (ref 30.0–36.0)
MCV: 89.9 fL (ref 80.0–100.0)
Monocytes Absolute: 0.8 10*3/uL (ref 0.1–1.0)
Monocytes Relative: 8 %
Neutro Abs: 4.3 10*3/uL (ref 1.7–7.7)
Neutrophils Relative %: 46 %
Platelets: 199 10*3/uL (ref 150–400)
RBC: 4.84 MIL/uL (ref 4.22–5.81)
RDW: 12.8 % (ref 11.5–15.5)
WBC: 9.3 10*3/uL (ref 4.0–10.5)
nRBC: 0 % (ref 0.0–0.2)

## 2023-03-27 LAB — HIV ANTIBODY (ROUTINE TESTING W REFLEX): HIV Screen 4th Generation wRfx: NONREACTIVE

## 2023-03-27 LAB — TSH: TSH: 1.627 u[IU]/mL (ref 0.350–4.500)

## 2023-03-27 LAB — ETHANOL: Alcohol, Ethyl (B): <10 mg/dL (ref <10)

## 2023-03-27 MED ORDER — ADULT MULTIVITAMIN W/MINERALS CH
1.0000 | ORAL_TABLET | Freq: Every day | ORAL | Status: DC
  Administered 2023-03-27 – 2023-03-31 (×5): 1 via ORAL
  Filled 2023-03-27 (×5): qty 1

## 2023-03-27 MED ORDER — THIAMINE MONONITRATE 100 MG PO TABS: 100.0000 mg | ORAL_TABLET | Freq: Every day | ORAL | Status: DC

## 2023-03-27 MED ORDER — ONDANSETRON 4 MG PO TBDP: 4.0000 mg | ORAL_TABLET | Freq: Four times a day (QID) | ORAL | Status: DC | PRN

## 2023-03-27 MED ORDER — LORAZEPAM 1 MG PO TABS: 1.0000 mg | ORAL_TABLET | Freq: Four times a day (QID) | ORAL | Status: DC | PRN

## 2023-03-27 MED ORDER — ADULT MULTIVITAMIN W/MINERALS CH: 1.0000 | ORAL_TABLET | Freq: Every day | ORAL | Status: DC

## 2023-03-27 MED ORDER — THIAMINE HCL 100 MG/ML IJ SOLN: 100.0000 mg | Freq: Once | INTRAMUSCULAR | Status: DC

## 2023-03-27 MED ORDER — TRAZODONE HCL 50 MG PO TABS
50.0000 mg | ORAL_TABLET | Freq: Every evening | ORAL | Status: DC | PRN
  Administered 2023-03-28 – 2023-03-31 (×3): 50 mg via ORAL
  Filled 2023-03-27 (×3): qty 1

## 2023-03-27 MED ORDER — ONDANSETRON 4 MG PO TBDP: 4.0000 mg | ORAL_TABLET | Freq: Four times a day (QID) | ORAL | Status: AC | PRN

## 2023-03-27 MED ORDER — NICOTINE 14 MG/24HR TD PT24
14.0000 mg | MEDICATED_PATCH | Freq: Every day | TRANSDERMAL | Status: DC
  Administered 2023-03-27 – 2023-03-31 (×5): 14 mg via TRANSDERMAL
  Filled 2023-03-27 (×5): qty 1

## 2023-03-27 MED ORDER — THIAMINE HCL 100 MG/ML IJ SOLN
100.0000 mg | Freq: Once | INTRAMUSCULAR | Status: AC
  Administered 2023-03-27: 100 mg via INTRAMUSCULAR
  Filled 2023-03-27: qty 2

## 2023-03-27 MED ORDER — LORAZEPAM 1 MG PO TABS: 1.0000 mg | ORAL_TABLET | Freq: Four times a day (QID) | ORAL | Status: AC | PRN

## 2023-03-27 MED ORDER — LOPERAMIDE HCL 2 MG PO CAPS: 2.0000 mg | ORAL_CAPSULE | ORAL | Status: AC | PRN

## 2023-03-27 MED ORDER — LOPERAMIDE HCL 2 MG PO CAPS: 2.0000 mg | ORAL_CAPSULE | ORAL | Status: DC | PRN

## 2023-03-27 MED ORDER — MAGNESIUM HYDROXIDE 400 MG/5ML PO SUSP: 30.0000 mL | Freq: Every day | ORAL | Status: DC | PRN

## 2023-03-27 MED ORDER — HYDROXYZINE HCL 25 MG PO TABS: 25.0000 mg | ORAL_TABLET | Freq: Four times a day (QID) | ORAL | Status: AC | PRN

## 2023-03-27 MED ORDER — THIAMINE MONONITRATE 100 MG PO TABS
100.0000 mg | ORAL_TABLET | Freq: Every day | ORAL | Status: DC
  Administered 2023-03-28 – 2023-03-31 (×4): 100 mg via ORAL
  Filled 2023-03-27 (×4): qty 1

## 2023-03-27 MED ORDER — HYDROXYZINE HCL 25 MG PO TABS: 25.0000 mg | ORAL_TABLET | Freq: Three times a day (TID) | ORAL | Status: DC | PRN

## 2023-03-27 MED ORDER — ALUM & MAG HYDROXIDE-SIMETH 200-200-20 MG/5ML PO SUSP: 30.0000 mL | ORAL | Status: DC | PRN

## 2023-03-27 MED ORDER — ACETAMINOPHEN 325 MG PO TABS: 650.0000 mg | ORAL_TABLET | Freq: Four times a day (QID) | ORAL | Status: DC | PRN

## 2023-03-27 NOTE — ED Notes (Signed)
Pt in holding to go to [facility] for detox and residential tx for cocaine abuse. Pt reports last use of cocaine was last night. Denies SI/HI/AVH.

## 2023-03-27 NOTE — ED Notes (Signed)
Pt discharged to [facility] escorted by staff in no acute distress. Denies withdrawal sx at current. Safety maintained.

## 2023-03-27 NOTE — ED Notes (Signed)
Patient admitted to [facility] for detox from cocaine and etoh.  Patient calm and quiet with guarded affect.  He is malodorous and given new scrubs, toiletries and towels.  Patient oriented to unit and given lunch upon arrival to unit.  He is now resting in bed without issue or complaint.  No evidence of withdrawal at this time.  Patient denied avh sh or plan.  Encouraged t seek out staff if overwhelmed by thoughts or feelings.

## 2023-03-27 NOTE — ED Notes (Signed)
Patient remains asleep in bed at this time without distress or complaint.  Will monitor.

## 2023-03-27 NOTE — ED Notes (Signed)
Patient did not attend group throughout entire session and remains in bed sleeping. Will continue to monitor/support.

## 2023-03-27 NOTE — ED Notes (Signed)
Patient arrived on unit for detox from fentanyl.  He was cooperative with admission process and was oriented to unit and his room.  Patient was in his room for 5 minutes and came to nursing station and stated he wanted immediate discharge.  M.D. made aware and met with patient and he will be discharged from facility.

## 2023-03-27 NOTE — Progress Notes (Signed)
   03/27/23 0857  [facility] Triage Screening (Walk-ins at [facility] only)  How Did You Hear About [place]? Self  What Is the Reason for Your Visit/Call Today? Pt presents to [facility] voluntarily unaccompanied seeking detox. Pt reports using cocaine and alcohol daily, his last use was this morning. Pt states he uses about $60 worth of cocaine and 3-4, 24oz beers. He reports he started using yesterday and continued until this morning. Pt denies any hx of serious withdrawal symptoms, he states after 1 day without using substances he only experiences muscle spasms.Pt denies SI/HI and AVH.  How Long Has This Been Causing You Problems? > than 6 months  Have You Recently Had Any Thoughts About Hurting Yourself? No  Are You Planning to Commit Suicide/Harm Yourself At This time? No  Have you Recently Had Thoughts About Hurting Someone [name]? No  Are You Planning To Harm Someone At This Time? No  Are you currently experiencing any auditory, visual or other hallucinations? No  Have You Used Any Alcohol or Drugs in the Past 24 Hours? Yes  How long ago did you use Drugs or Alcohol? today  What Did You Use and How Much? $60 worth of cocaine, 3-4, 24oz beers  Do you have any current medical co-morbidities that require immediate attention? No  Clinician description of patient physical appearance/behavior: calm, cooperative  What Do You Feel Would Help You the Most Today? Alcohol or Drug Use Treatment  If access to [facility]gent Care was not available, would you have sought care in the Emergency Department? No  Determination of Need Urgent (48 hours)  Options For Referral Facility-Based Crisis

## 2023-03-27 NOTE — ED Notes (Signed)
Patient sleeping in bed appears comfortable without withdrawal symptoms.  Will monitor.

## 2023-03-27 NOTE — Discharge Instructions (Signed)
Patient has been accepted to [facility] in [place], [place] and can admit to their facility on today. Transportation will be arranged via [facility]. Address is: [address] [place], [place] [address]  [facility] [address], [place], [address] [phone] phone  New Patient Assessment/Therapy Walk-Ins:  Monday and Wednesday: 8 am until slots are full. Every 1st and 2nd Fridays of the month: 1 pm - 5 pm.  NO ASSESSMENT/THERAPY WALK-INS ON TUESDAYS OR THURSDAYS  New Patient Assessment/Medication Management Walk-Ins:  Monday - Friday:  8 am - 11 am.  For all walk-ins, we ask that you arrive by 7:30 am because patients will be seen in the order of arrival.  Availability is limited; therefore, you may not be seen on the same day that you walk-in.  Our goal is to serve and meet the needs of our community to the best of our ability.  SUBSTANCE USE TREATMENT for Medicaid and State Funded/IPRS  [facility] [address], [place], [address] [phone] phone NOTE: [facility] is no longer offering IOP services.  Serves those who are low-income or have no insurance.  [facility] [address] [place], [place], [address] [phone] phone [phone] fax NOTE: Does have Substance Abuse-Intensive Outpatient Program [facility]) as well as transitional housing if eligible.  [facility] [address] [place], [place], [address] [phone] phone [phone] fax  [facility] [phone] [place], [place], [address] [phone] phone [phone] fax  HALFWAY HOUSES:  [facility] [phone]  [employer].oxfordvacancies.[email or web address]  12 STEP PROGRAMS:  Alcoholics Anonymous of [place] SoftwareChalet.be  Narcotics Anonymous of [place] HitProtect.dk  Al-Anon of [place], [place]  www.[place]alanon.org/find-meetings.html  Nar-Anon https://nar-anon.org/find-a-meetin  List of Residential placements:   [facility] in [place]: [phone]  [facility]: [phone]  [place], [place] [phone]: Male and male facility; 30-day program: (uninsured and Medicaid such as Laurena Bering, [place], [facility], partners)  [facility]: [phone]; men and women's facility; 28 days; Can have Medicaid tailored plan Tour manager or Partners)  [facility]: [phone] Karoline Caldwell or Larita Fife; 28 day program; must be fully detox; tailored Medicaid or no insurance  [address] in [place], [place]; [phone]; 28 day all males program; no insurance accepted  [facility] in [place]: Gabriel Rung [phone] (no insurance or Medicaid only); 90 days; outpatient services but provide housing in apartments downtown [place]  [facility] Admission: [phone]: Patient must complete phone screening for placement: [place], [place]; 6 month program; uninsured, Medicaid, and [employer].   [facility]: no insurance required; [phone]  [facility]: [phone]; Intake: Molly Maduro; Must fill out application online; Alecia Lemming Delay [phone] x [address] [facility] in [place], [place]: [phone]; Admissions Coordinators Mr. Maurine Minister or Barron Alvine; 90 day program.  [facility]: [place], [place] [phone]; Co-Ed 9 month to a year program; Online application; Men entry fee is $500 (6-35months);  [employer]: [address] [place], [place] [address]; no fee or insurance required; minimum of 2 years; Highly structured; work based; Intake Coordinator is Thayer Ohm [phone]  [facility] in [place], [place]: [phone]; Fax number is [phone]; website: www.[facility].org; Requires 3-6 page autobiography; 2 year program (18 months and then 30month transitional housing);  Admission fee is $300; no insurance needed; work Automotive engineer in [place], [place]: [employer] Desk Staff: Danise Edge [phone]: They have a Men's Regenerations Program 6-39months. Free program; There is an initial $  300 fee however, they are willing to work with patients regarding that. Application is online.  [facility]: Admissions [phone] Doran Heater ext [phone]; Any 7-90 day program is out of pocket; 12 month program is free of charge; there is a $275 entry fee; Patient is responsible for own transportation  Follow-up recommendations:  Activity:  Normal, as tolerated Diet:  Per PCP recommendation  Patient is instructed prior to discharge to: Take all medications as prescribed by her mental healthcare provider. Report any adverse effects and/or reactions from the medicines to her outpatient provider promptly. Patient has been instructed & cautioned: To not engage in alcohol and or illegal drug use while on prescription medicines.  In the event of worsening symptoms, patient is instructed to call the crisis hotline at [phone], 911 and or go to the nearest ED for appropriate evaluation and treatment of symptoms. To follow-up with her primary care provider for your other medical issues, concerns and or health care needs.

## 2023-03-27 NOTE — BH Assessment (Addendum)
Comprehensive Clinical Assessment (CCA) Note  03/27/2023 Mardene Speak [id number]  DISPOSITION: White NP recommends patient to [facility] to assist with ongoing SA issues.   The patient demonstrates the following risk factors for suicide: Chronic risk factors for suicide include: N/A. Acute risk factors for suicide include: N/A. Protective factors for this patient include: coping skills. Considering these factors, the overall suicide risk at this point appears to be low. Patient is appropriate for outpatient follow up.   Patient is a 37 year old male that presents this date requesting assistance with ongoing SA issues. Patient denies any S/I, H/I or AVH. Patient denies having a current OP provider for mental health or SA issues. Patient has a past history significant for depression although has not been on medications since 2012.  Patient denies any previous attempts or gestures at self harm. Patient states he has been using 50 to 60 dollars worth of cocaine 3 to 5 times a week with last use in the last 24 hours when patient reported using "about 50 dollars worth of crack." Patient cannot recall the last time he has maintained his sobriety although states he was in a residential treatment program in 2019 and 2022. Patient reports when he is coming off of crack cocaine he experiences muscle spasms in his hand.  Patient also reports using alcohol 3 to 5 times a week stating he drinks 4 to 6 24 oz beers per sitting or, "a couple of 40s," with last use around 0400 hors this date when he reported "drinking a couple beers." He reports drinking alcohol since age 89. He reports that he has been binge using cocaine and alcohol nonstop since yesterday up until 4 AM this morning. He denies alcohol withdrawal symptoms. He denies a history of alcohol withdrawal seizures or delirium tremens. He reports past substance abuse treatment at [facility] in 2019 and 2022.  On evaluation, patient is alert and oriented x 4. His  thought process is linear and goal oriented. His speech is clear and coherent. His mood is dysphoric and affect is congruent. He denies suicidal ideations. He denies homicidal ideations. He denies auditory or visual hallucinations. There is no objective evidence that the patient is currently responding to internal or external stimuli. Patient denies any current legal issues or access to firearms.   Chief Complaint:  Chief Complaint  Patient presents with   Addiction Problem   Alcohol Problem   Visit Diagnosis: SA induced mood disorder     CCA Screening, Triage and Referral (STR)  Patient Reported Information How did you hear about [place]? Self  What Is the Reason for Your Visit/Call Today? Pt presents to [facility] voluntarily unaccompanied seeking detox. Pt reports using cocaine and alcohol daily, his last use was this morning. Pt states he uses about $60 worth of cocaine and 3-4, 24oz beers. He reports he started using yesterday and continued until this morning. Pt denies any hx of serious withdrawal symptoms, he states after 1 day without using substances he only experiences muscle spasms.Pt denies SI/HI and AVH.  How Long Has This Been Causing You Problems? > than 6 months  What Do You Feel Would Help You the Most Today? Alcohol or Drug Use Treatment   Have You Recently Had Any Thoughts About Hurting Yourself? No  Are You Planning to Commit Suicide/Harm Yourself At This time? No   Flowsheet Row ED from 03/27/2023 in [facility] from [facility]11/2023 in [facility]  C-SSRS RISK CATEGORY No Risk No Risk       Have you Recently Had Thoughts About Hurting Someone [name]? No  Are You Planning to Harm Someone at This Time? No  Explanation: NA   Have You Used Any Alcohol or Drugs in the Past 24 Hours? Yes  What Did You Use and How Much? Pt reports using "about 20 dollars" worth of cocaine in the last 24 hours   Do You  Currently Have a Therapist/Psychiatrist? No  Name of Therapist/Psychiatrist: Name of Therapist/Psychiatrist: NA   Have You Been Recently Discharged From Any Office Practice or Programs? No  Explanation of Discharge From Practice/Program: NA     CCA Screening Triage Referral Assessment Type of Contact: Face-to-Face  Telemedicine Service Delivery:   Is this Initial or Reassessment?   Date Telepsych consult ordered in [facility]:    Time Telepsych consult ordered in [facility]:    [facility] Assessment: [facility]  Provider Location: [facility] Services   Collateral Involvement: None at this time   Does Patient Have a Automotive engineer Guardian? No  Legal Guardian Contact Information: NA  Copy of Legal Guardianship Form: -- (NA)  Legal Guardian Notified of Arrival: -- (NA)  Legal Guardian Notified of Pending Discharge: -- (NA)  If Minor and Not Living with Parent(s), Who has Custody? NA  Is CPS involved or ever been involved? Never  Is APS involved or ever been involved? Never   Patient Determined To Be At Risk for Harm To Self or Others Based on Review of Patient Reported Information or Presenting Complaint? No  Method: No Plan  Availability of Means: No access or NA  Intent: Vague intent or NA  Notification Required: No need or identified person  Additional Information for Danger to Others Potential: -- (NA)  Additional Comments for Danger to Others Potential: None noted  Are There Guns or Other Weapons in Your Home? No  Types of Guns/Weapons: Denies  Are These Weapons Safely Secured?                            -- (NA)  Who Could Verify You Are Able To Have These Secured: NA  Do You Have any Outstanding Charges, Pending Court Dates, Parole/Probation? Pt denies any current charges  Contacted To Inform of Risk of Harm To Self or Others: -- (NA)    Does Patient Present under Involuntary Commitment? No    [place] of Residence:  [place]   Patient Currently Receiving the Following Services: Not Receiving Services   Determination of Need: Urgent (48 hours)   Options For Referral: Facility-Based Crisis     CCA Biopsychosocial Patient Reported Schizophrenia/Schizoaffective Diagnosis in Past: No   Strengths: Patient is willing to participate in treatment   Mental Health Symptoms Depression:   Difficulty Concentrating; Fatigue; Hopelessness   Duration of Depressive symptoms:  Duration of Depressive Symptoms: Greater than two weeks   Mania:   None   Anxiety:    Difficulty concentrating   Psychosis:   None   Duration of Psychotic symptoms:    Trauma:   None   Obsessions:   None   Compulsions:   None   Inattention:   None   Hyperactivity/Impulsivity:   None   Oppositional/Defiant Behaviors:   None   Emotional Irregularity:   None   Other Mood/Personality Symptoms:   None noted    Mental Status Exam Appearance and self-care  Stature:  Average   Weight:   Average weight   Clothing:   Casual   Grooming:   Normal   Cosmetic use:   None   Posture/gait:   Normal   Motor activity:   Not Remarkable   Sensorium  Attention:   Normal   Concentration:   Normal   Orientation:   X5   Recall/memory:   Normal   Affect and Mood  Affect:   Anxious; Depressed   Mood:   Depressed; Anxious   Relating  Eye contact:   Normal   Facial expression:   Anxious; Depressed   Attitude toward examiner:   Cooperative   Thought and Language  Speech flow:  Clear and Coherent   Thought content:   Appropriate to Mood and Circumstances   Preoccupation:   None   Hallucinations:   None   Organization:   Intact   Affiliated [employer] Services of Knowledge:   Fair   Intelligence:   Average   Abstraction:   Normal   Judgement:   Fair   Dance movement psychotherapist:   Realistic   Insight:   Fair   Decision Making:   Normal   Social Functioning  Social  Maturity:   Responsible   Social Judgement:   Normal   Stress  Stressors:   Transitions; Family conflict (Ongoing SA use)   Coping Ability:   Human resources officer Deficits:   None   Supports:   Support needed     Religion: Religion/Spirituality Are You A Religious Person?: No How Might This Affect Treatment?: NA  Leisure/Recreation: Leisure / Recreation Do You Have Hobbies?: No  Exercise/Diet: Exercise/Diet Do You Exercise?: No Have You Gained or Lost A Significant Amount of Weight in the Past Six Months?: No Do You Follow a Special Diet?: No Do You Have Any Trouble Sleeping?: No   CCA Employment/Education Employment/Work Situation: Employment / Work Situation Employment Situation: Employed Work Stressors: Pt states his SA use has at times been a problem at work [employer] Job has Been Impacted by Current Illness: No Has Patient ever Been in Equities trader?: No  Education: Education Is Patient Currently Attending School?: No Last Grade Completed: 12 Did You Product manager?: No Did You Have An Individualized Education Program (IIEP): No Did You Have Any Difficulty At [employer]?: No Patient's Education Has Been Impacted by Current Illness: No   CCA Family/Childhood History Family and Relationship History: Family history Marital status: Single Does patient have children?: Yes How many children?: 65 (73 year old daughter) How is patient's relationship with their children?: Pt reports a good relationship with children although reports he doesn't get to see them that much  Childhood History:  Childhood History By whom was/is the patient raised?: Mother, Grandparents Did patient suffer any verbal/emotional/physical/sexual abuse as a child?: No Did patient suffer from severe childhood neglect?: No Has patient ever been sexually abused/assaulted/raped as an adolescent or adult?: No Was the patient ever a victim of a crime or a disaster?: No Witnessed domestic  violence?: No Has patient been affected by domestic violence as an adult?: No       CCA Substance Use Alcohol/Drug Use: Alcohol / Drug Use Pain Medications: See MAR Prescriptions: See MAR Over the Counter: See MAR History of alcohol / drug use?: Yes (ocassional on weekends) Longest period of sobriety (when/how long): unknown Negative Consequences of Use: Personal relationships Withdrawal Symptoms: None Substance #1 Name of Substance 1: Cocaine (crack cocaine) 1 - Age of First Use: 25 1 -  Amount (size/oz): Pt states anywhere from 20 to 50 dollars woirth 1 - Frequency: Pt states 3 to 5 times a week 1 - Duration: Ongoing for the last year 1 - Last Use / Amount: Pt states he used "20 dollars worth" in the last 24 hours 1 - Method of Aquiring: Illegal 1- Route of Use: Smoking Substance #2 Name of Substance 2: Alcohol 2 - Age of First Use: 15 2 - Amount (size/oz): Pt states usually "a couple 40 oz beers" 2 - Frequency: Pt reports 3 to 5 times a week 2 - Duration: Ongoing for the last year 2 - Last Use / Amount: Pt states he "drank a 40 oz beer" in the last 24 hours 2 - Method of Aquiring: Legal 2 - Route of Substance Use: Oral                     ASAM's:  Six Dimensions of Multidimensional Assessment  Dimension 1:  Acute Intoxication and/or Withdrawal Potential:   Dimension 1:  Description of individual's past and current experiences of substance use and withdrawal: No signs of withdrawal present  Dimension 2:  Biomedical Conditions and Complications:   Dimension 2:  Description of patient's biomedical conditions and  complications: Fully functioning able to deal with any physical pain  Dimension 3:  Emotional, Behavioral, or Cognitive Conditions and Complications:  Dimension 3:  Description of emotional, behavioral, or cognitive conditions and complications: Suspected EBC that requires intervention but does not interfer with recovery efforts  Dimension 4:  Readiness to  Change:  Dimension 4:  Description of Readiness to Change criteria: Willing to participate in treatment  Dimension 5:  Relapse, Continued use, or Continued Problem Potential:  Dimension 5:  Relapse, continued use, or continued problem potential critiera description: Miminal relapse potential  Dimension 6:  Recovery/Living Environment:  Dimension 6:  Recovery/Iiving environment criteria description: Has support recovery environment  ASAM Severity Score: ASAM's Severity Rating Score: 2  ASAM Recommended Level of Treatment: ASAM Recommended Level of Treatment: Level III Residential Treatment   Substance use Disorder (SUD) Substance Use Disorder (SUD)  Checklist Symptoms of Substance Use: Continued use despite having a persistent/recurrent physical/psychological problem caused/exacerbated by use, Continued use despite persistent or recurrent social, interpersonal problems, caused or exacerbated by use  Recommendations for Services/Supports/Treatments: Recommendations for Services/Supports/Treatments Recommendations For Services/Supports/Treatments: Facility Based Crisis  Discharge Disposition:    DSM5 Diagnoses: Patient Active Problem List   Diagnosis Date Noted   Substance induced mood disorder (HCC) 01/16/2021   Alcohol abuse 01/16/2021   Cocaine use disorder, moderate, in controlled environment (HCC) 01/16/2021   Polysubstance abuse (HCC) 07/24/2011   Schizoaffective disorder, depressive type (HCC) 07/24/2011     Referrals to Alternative Service(s): Referred to Alternative Service(s):   Place:   Date:   Time:    Referred to Alternative Service(s):   Place:   Date:   Time:    Referred to Alternative Service(s):   Place:   Date:   Time:    Referred to Alternative Service(s):   Place:   Date:   Time:     Alfredia Ferguson, LCAS

## 2023-03-27 NOTE — ED Provider Notes (Signed)
Behavioral Health Urgent Care Medical Screening Exam  Patient Name: Frederick Ballard MRN: [id number] Date of Evaluation: 03/27/23 Chief Complaint:  "seeking detox"  Diagnosis:  Final diagnoses:  Substance abuse (HCC)  Cocaine abuse (HCC)  Uncomplicated alcohol dependence (HCC)    History of Present illness: Frederick Ballard is a 37 y.o. male patient with a reported psychiatric history significant for depression and substance abuse, (cocaine and alcohol use) and a documented history of schizoaffective disorder who presents to the [facility] behavioral health urgent care voluntary requesting detox.  Patient seen and evaluated face-to-face by this provider, chart reviewed and case discussed with Dr. Lucianne Muss.  On evaluation, patient is alert and oriented x 4. His thought process is linear and goal oriented. His speech is clear and coherent. His mood is dysphoric and affect is congruent. He denies suicidal ideations. He denies homicidal ideations. He denies auditory or visual hallucinations. There is no objective evidence that the patient is currently responding to internal or external stimuli. He is calm and cooperative and does not appear to be in acute distress on exam.  Patient reports using cocaine for the past 5 years, on average he uses cocaine everyday, $50-$60 worth, and last reported use was 4 AM this morning via smoking. Patient reports when he is coming off of crack cocaine he experiences muscle spasms in his hand. He reports drinking alcohol everyday, on average 4 (24) ounces of strong beer, along with occasional liquor, and last reported consumption of alcohol was around 4 AM this morning. He reports drinking alcohol since age 57. He reports that he has been binge using cocaine and alcohol nonstop since yesterday up until 4 AM this morning. He denies alcohol withdrawal symptoms. He denies a history of alcohol withdrawal seizures or delirium tremens. He reports past substance abuse treatment  at [facility] in 2019 and 2022. He states that his longest sobriety was for 7 months in 2019 when he went to long-term treatment. He describes his mood as destructive and restless. PHQ- 13 on exam. He reports a normal appetite. He reports poor sleep, due to sleeping outside on his sister's porch. He states that he has been homeless for the past month due to being in and out of his sister's house using drugs. He denies legal issues. He has two children. Adult daughter and 30 year old son. He reports a family psychiatric history of father has a history of cocaine, heroin, and alcohol abuse. He denies taking prescribed medications or over-the-counter medications. He states in the past he was told that he was depressed and was prescribed Seroquel, and Zoloft in 2012. He denies outpatient psychiatry or therapy. He denies a significant medical history.  Flowsheet Row ED from 03/27/2023 in [facility] ED from 03/11/2023 in [facility] Emergency Department at [facility]  C-SSRS RISK CATEGORY No Risk No Risk       Psychiatric Specialty Exam  Presentation  General Appearance:Appropriate for Environment  Eye Contact:Fair  Speech:Clear and Coherent  Speech Volume:Normal  Handedness:Right   Mood and Affect  Mood: Dysphoric  Affect: Congruent   Thought Process  Thought Processes: Coherent; Goal Directed  Descriptions of Associations:Intact  Orientation:Full (Time, Place and Person)  Thought Content:Logical    Hallucinations:None  Ideas of Reference:None  Suicidal Thoughts:No  Homicidal Thoughts:No   Sensorium  Memory: Immediate Fair; Recent Fair; Remote Fair  Judgment: Fair  Insight: Fair   Art therapist  Concentration: Fair  Attention Span: Fair  Recall: Fair  Fund of Knowledge: Fair  Language: Fair   Psychomotor Activity  Psychomotor Activity: Normal   Assets  Assets: Manufacturing systems engineer; Desire for  Improvement; Leisure Time; Physical Health   Sleep  Sleep: Poor  Number of hours:  5   Physical Exam: Physical Exam Eyes:     Conjunctiva/sclera: Conjunctivae normal.  Cardiovascular:     Rate and Rhythm: Normal rate.  Pulmonary:     Effort: Pulmonary effort is normal.  Musculoskeletal:        General: Normal range of motion.     Cervical back: Normal range of motion.  Neurological:     Mental Status: He is alert and oriented to person, place, and time.    Review of Systems  Constitutional: Negative.   HENT: Negative.    Eyes: Negative.   Respiratory: Negative.    Cardiovascular: Negative.   Gastrointestinal: Negative.   Genitourinary: Negative.   Musculoskeletal: Negative.   Neurological: Negative.   Endo/Heme/Allergies: Negative.    Blood pressure 113/83, pulse 64, temperature 97.8 F (36.6 C), temperature source Oral, resp. rate 18, SpO2 98%. There is no height or weight on file to calculate BMI.  Musculoskeletal: Strength & Muscle Tone: within normal limits Gait & Station: normal Patient leans: N/A   [facility] MSE Discharge Disposition for Follow up and Recommendations: Based on my evaluation I certify that psychiatric inpatient services furnished can reasonably be expected to improve the patient's condition which I recommend transfer to an appropriate accepting facility.   Patient accepted to the [facility] Facility Based Crisis unit for substance abuse treatment and mood stabilization. Patient is voluntary.   Alcohol use Add CIWA to screen for alcohol withdrawal  Add Ativan 1 mg po Q6H prn for alcohol withdrawal  Mood dx Consider adding antidepressant or mood stabilizer during course of treatment if symptoms of depression continue to persist.    Lab Orders         CBC with Differential/Platelet         Comprehensive metabolic panel         Hemoglobin A1c         Ethanol         Lipid panel         TSH         HIV Antibody  (routine testing w rflx)         POCT Urine Drug Screen - (I-Screen)    EKG  Yittel Emrich L, NP 03/27/2023, 10:54 AM

## 2023-03-27 NOTE — ED Notes (Signed)
Patient sleeping in room. Attempts made to encourage patient to attend AA group tonight but patient refused to attend by remaining in bed. Will continue to monitor/support.

## 2023-03-27 NOTE — ED Notes (Signed)
Patient in the bedroom sleeping. NAD.  Respirations even and unlabored. Will continue to monitor for safety.

## 2023-03-27 NOTE — ED Notes (Signed)
Patient in the bedroom sleeping at the moment. NAD. Respirations even and unlabored. Will continue to monitor for safety.

## 2023-03-27 NOTE — ED Provider Notes (Cosign Needed)
Facility Based Crisis Admission H&P  Date: 03/30/23 Patient Name: Frederick Ballard MRN: [id number] Chief Complaint: detox from alcohol and cocaine  Diagnoses:  Final diagnoses:  Cocaine use disorder (HCC)  Alcohol use disorder    HPI: Frederick Ballard is a 37 y.o. male patient with a reported psychiatric history significant for depression and substance abuse, (cocaine and alcohol use) who presents to the [facility] voluntary requesting detox.   Per [facility] MSE note for [facility] admission, Patient reports using cocaine for the past 5 years, on average he uses cocaine everyday, $50-$60 worth, and last reported use was 4 AM this morning via smoking. Patient reports when he is coming off of crack cocaine he experiences muscle spasms in his hand. He reports drinking alcohol everyday, on average 4 (24) ounces of strong beer, along with occasional liquor, and last reported consumption of alcohol was around 4 AM this morning. He reports drinking alcohol since age 30. He reports that he has been binge using cocaine and alcohol nonstop since yesterday up until 4 AM this morning. He denies alcohol withdrawal symptoms. He denies a history of alcohol withdrawal seizures or delirium tremens. He reports past substance abuse treatment at [facility] in 2019 and 2022. He states that his longest sobriety was for 7 months in 2019 when he went to long-term treatment.   He describes his mood as destructive and restless. PHQ- 13 on exam. He reports a normal appetite. He reports poor sleep, due to sleeping outside on his sister's porch.   He states that he has been homeless for the past month due to being in and out of his sister's house using drugs. He denies legal issues. He has two children. Adult daughter and 61 year old son.   He reports a family psychiatric history of father has a history of cocaine, heroin, and alcohol abuse.   He denies taking prescribed medications or over-the-counter medications. He states in the past he was  told that he was depressed and was prescribed Seroquel, and Zoloft in 2012. He denies outpatient psychiatry or therapy. He denies a significant medical history.   PHQ 2-9:  Flowsheet Row ED from 03/27/2023 in [facility] Most recent reading at 03/27/2023  3:23 PM ED from 03/27/2023 in [facility] Most recent reading at 03/27/2023 11:04 AM  Thoughts that you would be better off dead, or of hurting yourself in some way Several days Several days  PHQ-9 Total Score 12 13       Flowsheet Row ED from 03/27/2023 in [facility] Most recent reading at 03/27/2023  2:45 PM ED from 03/27/2023 in [facility] Most recent reading at 03/27/2023  9:14 AM ED from 03/11/2023 in [facility] Emergency Department at [facility] Most recent reading at 03/11/2023  8:52 AM  C-SSRS RISK CATEGORY No Risk No Risk No Risk       Screenings    Flowsheet Row Most Recent Value  CIWA-Ar Total 0       Total Time spent with patient: 20 minutes  Musculoskeletal  Strength & Muscle Tone: within normal limits Gait & Station: normal Patient leans: N/A  Psychiatric Specialty Exam  Presentation General Appearance:  Appropriate for Environment; Casual  Eye Contact: Good  Speech: Clear and Coherent; Normal Rate  Speech Volume: Normal  Handedness: Right   Mood and Affect  Mood: Euthymic  Affect: Appropriate; Congruent   Thought Process  Thought Processes: Coherent; Goal  Directed; Linear  Descriptions of Associations:Intact  Orientation:Full (Time, Place and Person)  Thought Content:Logical  Diagnosis of Schizophrenia or Schizoaffective disorder in past: No   Hallucinations:Hallucinations: None  Ideas of Reference:None  Suicidal Thoughts:Suicidal Thoughts: No  Homicidal Thoughts:Homicidal Thoughts: No   Sensorium  Memory: Remote  Good  Judgment: Good  Insight: Good   Executive Functions  Concentration: Good  Attention Span: Good  Recall: Good  Fund of Knowledge: Good  Language: Good   Psychomotor Activity  Psychomotor Activity: Psychomotor Activity: Normal   Assets  Assets: Communication Skills; Resilience; Desire for Improvement   Sleep  Sleep: Sleep: Good   No data recorded  Physical Exam Eyes:     Conjunctiva/sclera: Conjunctivae normal.  Cardiovascular:     Rate and Rhythm: Normal rate.  Pulmonary:     Effort: Pulmonary effort is normal.  Musculoskeletal:        General: Normal range of motion.     Cervical back: Normal range of motion.  Neurological:     Mental Status: He is alert and oriented to person, place, and time.      Review of Systems  Constitutional: Negative.   HENT: Negative.    Eyes: Negative.   Respiratory: Negative.    Cardiovascular: Negative.   Gastrointestinal: Negative.   Genitourinary: Negative.   Musculoskeletal: Negative.   Neurological: Negative.   Endo/Heme/Allergies: Negative.    Blood pressure 111/72, pulse 70, temperature 98.1 F (36.7 C), temperature source Oral, resp. rate 16, SpO2 100%. There is no height or weight on file to calculate BMI.  Past Psychiatric History: MDD, AUD, stimulant use   Is the patient at risk to self? No  Has the patient been a risk to self in the past 6 months? No .    Has the patient been a risk to self within the distant past? No   Is the patient a risk to others? No   Has the patient been a risk to others in the past 6 months? No   Has the patient been a risk to others within the distant past? No   Past Medical History: None reported Family History: father has a history of cocaine, heroin, and alcohol abuse  Social History: unhoused, unemployed, no support system  Last Labs:  Admission on 03/27/2023, Discharged on 03/27/2023  Component Date Value Ref Range Status   WBC 03/27/2023 9.3  4.0 -  10.5 K/uL Final   RBC 03/27/2023 4.84  4.22 - 5.81 MIL/uL Final   Hemoglobin 03/27/2023 14.5  13.0 - 17.0 g/dL Final   HCT 16/04/9603 43.5  39.0 - 52.0 % Final   MCV 03/27/2023 89.9  80.0 - 100.0 fL Final   MCH 03/27/2023 30.0  26.0 - 34.0 pg Final   MCHC 03/27/2023 33.3  30.0 - 36.0 g/dL Final   RDW 54/03/8118 12.8  11.5 - 15.5 % Final   Platelets 03/27/2023 199  150 - 400 K/uL Final   nRBC 03/27/2023 0.0  0.0 - 0.2 % Final   Neutrophils Relative % 03/27/2023 46  % Final   Neutro Abs 03/27/2023 4.3  1.7 - 7.7 K/uL Final   Lymphocytes Relative 03/27/2023 42  % Final   Lymphs Abs 03/27/2023 3.9  0.7 - 4.0 K/uL Final   Monocytes Relative 03/27/2023 8  % Final   Monocytes Absolute 03/27/2023 0.8  0.1 - 1.0 K/uL Final   Eosinophils Relative 03/27/2023 3  % Final   Eosinophils Absolute 03/27/2023 0.3  0.0 - 0.5 K/uL Final  Basophils Relative 03/27/2023 1  % Final   Basophils Absolute 03/27/2023 0.1  0.0 - 0.1 K/uL Final   Immature Granulocytes 03/27/2023 0  % Final   Abs Immature Granulocytes 03/27/2023 0.02  0.00 - 0.07 K/uL Final   Performed at [facility], [address], [place], [place] [address]   Sodium 03/27/2023 136  135 - 145 mmol/L Final   Potassium 03/27/2023 4.0  3.5 - 5.1 mmol/L Final   Chloride 03/27/2023 101  98 - 111 mmol/L Final   CO2 03/27/2023 26  22 - 32 mmol/L Final   Glucose, Bld 03/27/2023 77  70 - 99 mg/dL Final   Glucose reference range applies only to samples taken after fasting for at least 8 hours.   BUN 03/27/2023 12  6 - 20 mg/dL Final   Creatinine, Ser 03/27/2023 0.97  0.61 - 1.24 mg/dL Final   Calcium 09/81/1914 9.4  8.9 - 10.3 mg/dL Final   Total Protein 78/29/5621 7.4  6.5 - 8.1 g/dL Final   Albumin 30/86/5784 4.4  3.5 - 5.0 g/dL Final   AST 69/62/9528 56 (H)  15 - 41 U/L Final   ALT 03/27/2023 53 (H)  0 - 44 U/L Final   Alkaline Phosphatase 03/27/2023 90  38 - 126 U/L Final   Total Bilirubin 03/27/2023 1.0  0.3 - 1.2 mg/dL Final   GFR,  Estimated 03/27/2023 >60  >60 mL/min Final   Comment: (NOTE) Calculated using the CKD-EPI Creatinine Equation (2021)    Anion gap 03/27/2023 9  5 - 15 Final   Performed at [facility], [address] [place], [place] [address]   Hgb A1c MFr Bld 03/27/2023 5.5  4.8 - 5.6 % Final   Comment: (NOTE)         Prediabetes: 5.7 - 6.4         Diabetes: >6.4         Glycemic control for adults with diabetes: <7.0    Mean Plasma Glucose 03/27/2023 111  mg/dL Final   Comment: (NOTE) Performed At: [facility] [address] [place], [place] [id number] Jolene Schimke MD [id number]    Alcohol, Ethyl (B) 03/27/2023 <10  <10 mg/dL Final   Comment: (NOTE) Lowest detectable limit for serum alcohol is 10 mg/dL.  For medical purposes only. Performed at [facility], [address] [place], [place] [address]    Cholesterol 03/27/2023 219 (H)  0 - 200 mg/dL Final   Triglycerides 56/43/3295 198 (H)  <150 mg/dL Final   HDL 18/84/1660 61  >40 mg/dL Final   Total CHOL/HDL Ratio 03/27/2023 3.6  RATIO Final   VLDL 03/27/2023 40  0 - 40 mg/dL Final   LDL Cholesterol 03/27/2023 118 (H)  0 - 99 mg/dL Final   Comment:        Total Cholesterol/HDL:CHD Risk Coronary Heart Disease Risk Table                     Men   Women  1/2 Average Risk   3.4   3.3  Average Risk       5.0   4.4  2 X Average Risk   9.6   7.1  3 X Average Risk  23.4   11.0        Use the calculated Patient Ratio above and the CHD Risk Table to determine the patient's CHD Risk.        ATP III CLASSIFICATION (LDL):  <100  mg/dL   Optimal  130-865  mg/dL   Near or Above                    Optimal  130-159  mg/dL   Borderline  784-696  mg/dL   High  >295     mg/dL   Very High Performed at [facility], [address], [place], [place] [address]    TSH 03/27/2023 1.627  0.350 - 4.500 uIU/mL Final   Comment: Performed by a 3rd Generation assay with a functional sensitivity of <=0.01  uIU/mL. Performed at [facility], [address], [place], [place] [address]    POC Amphetamine UR 03/27/2023 None Detected  NONE DETECTED (Cut Off Level 1000 ng/mL) Final   POC Secobarbital (BAR) 03/27/2023 None Detected  NONE DETECTED (Cut Off Level 300 ng/mL) Final   POC Buprenorphine (BUP) 03/27/2023 None Detected  NONE DETECTED (Cut Off Level 10 ng/mL) Final   POC Oxazepam (BZO) 03/27/2023 None Detected  NONE DETECTED (Cut Off Level 300 ng/mL) Final   POC Cocaine UR 03/27/2023 Positive (A)  NONE DETECTED (Cut Off Level 300 ng/mL) Final   POC Methamphetamine UR 03/27/2023 None Detected  NONE DETECTED (Cut Off Level 1000 ng/mL) Final   POC Morphine 03/27/2023 None Detected  NONE DETECTED (Cut Off Level 300 ng/mL) Final   POC Methadone UR 03/27/2023 None Detected  NONE DETECTED (Cut Off Level 300 ng/mL) Final   POC Oxycodone UR 03/27/2023 None Detected  NONE DETECTED (Cut Off Level 100 ng/mL) Final   POC Marijuana UR 03/27/2023 None Detected  NONE DETECTED (Cut Off Level 50 ng/mL) Final   HIV Screen 4th Generation wRfx 03/27/2023 Non Reactive  Non Reactive Final   Performed at [facility], [address] [place], [place] [address]    Allergies: Hinda Glatter [paliperidone]  Medications:  Facility Ordered Medications  Medication   [COMPLETED] thiamine (VITAMIN B1) injection 100 mg   thiamine (VITAMIN B1) tablet 100 mg   multivitamin with minerals tablet 1 tablet   LORazepam (ATIVAN) tablet 1 mg   hydrOXYzine (ATARAX) tablet 25 mg   loperamide (IMODIUM) capsule 2-4 mg   ondansetron (ZOFRAN-ODT) disintegrating tablet 4 mg   traZODone (DESYREL) tablet 50 mg   acetaminophen (TYLENOL) tablet 650 mg   nicotine (NICODERM CQ - dosed in mg/24 hours) patch 14 mg    Long Term Goals: Improvement in symptoms so as ready for discharge  Short Term Goals: Patient will verbalize feelings in meetings with treatment team members., Patient will attend at least of 50% of the groups daily., Pt  will complete the PHQ9 on admission, day 3 and discharge., Patient will participate in completing the [place] Suicide Severity Rating Scale, Patient will score a low risk of violence for 24 hours prior to discharge, and Patient will take medications as prescribed daily.  Medical Decision Making   Alcohol Use Disorder Alcohol Withdrawal EtOH <10, start with PRN ativan for withdrawal sxms -CIWA with Ativan as needed for CIWA greater than 10 -Thiamine 100 mg IM first day and PO after that -Multivitamin with minerals daily -Tylenol 650 mg every 6 hours as needed for pain -Zofran 4 mg every 6 hours as needed for nausea or vomiting -Imodium 2 to 4 mg as needed for diarrhea or loose stools  -Maalox/Mylanta 30 mL every 4 hours as needed for indigestion -Milk of Mag 30 mL as needed for constipation  PRN medications: -hydroxyzine 25 mg q6h PRN for anxiety -trazodone  50 mg qhs PRN for sleep     Cocaine use d/o -encourage abstinence  Tobacco use d/o -Start nicotine patch  Recommendations  Based on my evaluation the patient does not appear to have an emergency medical condition.  Lance Muss, MD 03/30/23  8:07 AM

## 2023-03-28 DIAGNOSIS — F1410 Cocaine abuse, uncomplicated: Secondary | ICD-10-CM | POA: Diagnosis not present

## 2023-03-28 LAB — HEMOGLOBIN A1C
Hgb A1c MFr Bld: 5.5 % (ref 4.8–5.6)
Mean Plasma Glucose: 111 mg/dL

## 2023-03-28 MED ORDER — NALTREXONE HCL 50 MG PO TABS
50.0000 mg | ORAL_TABLET | Freq: Every day | ORAL | Status: DC
  Administered 2023-03-28 – 2023-03-29 (×2): 50 mg via ORAL
  Filled 2023-03-28 (×2): qty 1

## 2023-03-28 NOTE — ED Provider Notes (Signed)
Behavioral Health Progress Note  Date and Time: 03/28/2023 11:18 AM Name: Frederick Ballard MRN:  [id number]  Subjective: Patient seen and assessed bedside.  Denies SI/HI/AVH.  Reports no significant alcohol withdrawal at this time.  He does report having vivid dreams of drinking alcohol as well as some alcohol cravings.  I discussed naltrexone which he was agreeable to start taking.  He reports feeling oversedated but otherwise no acute complaints at this time.  Reports mood to be "fine" and appetite to be appropriate.  All questions were addressed.  Diagnosis:  Final diagnoses:  Cocaine use disorder (HCC)  Alcohol use disorder    Total Time spent with patient: 45 minutes  Past Psychiatric History: MDD, AUD, stimulant use  Past Medical History: none Family History: father has a history of cocaine, heroin, and alcohol abuse Social History: homeless for the past month due to being in and out of his sister's house using drugs. He denies legal issues. He has two children. Adult daughter and 25 year old son.   Additional Social History:        Sleep: Good  Appetite:  Good  Current Medications:  Current Facility-Administered Medications  Medication Dose Route Frequency Provider Last Rate Last Admin   acetaminophen (TYLENOL) tablet 650 mg  650 mg Oral Q6H PRN Kizzie Ide B, MD       hydrOXYzine (ATARAX) tablet 25 mg  25 mg Oral Q6H PRN Kizzie Ide B, MD       loperamide (IMODIUM) capsule 2-4 mg  2-4 mg Oral PRN Lance Muss, MD       LORazepam (ATIVAN) tablet 1 mg  1 mg Oral Q6H PRN Kizzie Ide B, MD       multivitamin with minerals tablet 1 tablet  1 tablet Oral Daily Kizzie Ide B, MD   1 tablet at 03/28/23 0956   naltrexone (DEPADE) tablet 50 mg  50 mg Oral Daily Park Pope, MD       nicotine (NICODERM CQ - dosed in mg/24 hours) patch 14 mg  14 mg Transdermal Daily Kizzie Ide B, MD   14 mg at 03/28/23 0957   ondansetron (ZOFRAN-ODT) disintegrating tablet 4 mg  4 mg  Oral Q6H PRN Kizzie Ide B, MD       thiamine (VITAMIN B1) tablet 100 mg  100 mg Oral Daily Kizzie Ide B, MD   100 mg at 03/28/23 0956   traZODone (DESYREL) tablet 50 mg  50 mg Oral QHS PRN Lance Muss, MD       No current outpatient medications on file.    Labs  Lab Results:  Admission on 03/27/2023, Discharged on 03/27/2023  Component Date Value Ref Range Status   WBC 03/27/2023 9.3  4.0 - 10.5 K/uL Final   RBC 03/27/2023 4.84  4.22 - 5.81 MIL/uL Final   Hemoglobin 03/27/2023 14.5  13.0 - 17.0 g/dL Final   HCT 52/84/1324 43.5  39.0 - 52.0 % Final   MCV 03/27/2023 89.9  80.0 - 100.0 fL Final   MCH 03/27/2023 30.0  26.0 - 34.0 pg Final   MCHC 03/27/2023 33.3  30.0 - 36.0 g/dL Final   RDW 40/04/2724 12.8  11.5 - 15.5 % Final   Platelets 03/27/2023 199  150 - 400 K/uL Final   nRBC 03/27/2023 0.0  0.0 - 0.2 % Final   Neutrophils Relative % 03/27/2023 46  % Final   Neutro Abs 03/27/2023 4.3  1.7 - 7.7 K/uL Final   Lymphocytes Relative 03/27/2023  42  % Final   Lymphs Abs 03/27/2023 3.9  0.7 - 4.0 K/uL Final   Monocytes Relative 03/27/2023 8  % Final   Monocytes Absolute 03/27/2023 0.8  0.1 - 1.0 K/uL Final   Eosinophils Relative 03/27/2023 3  % Final   Eosinophils Absolute 03/27/2023 0.3  0.0 - 0.5 K/uL Final   Basophils Relative 03/27/2023 1  % Final   Basophils Absolute 03/27/2023 0.1  0.0 - 0.1 K/uL Final   Immature Granulocytes 03/27/2023 0  % Final   Abs Immature Granulocytes 03/27/2023 0.02  0.00 - 0.07 K/uL Final   Performed at [facility], [address], [place], [place] [address]   Sodium 03/27/2023 136  135 - 145 mmol/L Final   Potassium 03/27/2023 4.0  3.5 - 5.1 mmol/L Final   Chloride 03/27/2023 101  98 - 111 mmol/L Final   CO2 03/27/2023 26  22 - 32 mmol/L Final   Glucose, Bld 03/27/2023 77  70 - 99 mg/dL Final   Glucose reference range applies only to samples taken after fasting for at least 8 hours.   BUN 03/27/2023 12  6 - 20 mg/dL Final    Creatinine, Ser 03/27/2023 0.97  0.61 - 1.24 mg/dL Final   Calcium 69/62/9528 9.4  8.9 - 10.3 mg/dL Final   Total Protein 41/32/4401 7.4  6.5 - 8.1 g/dL Final   Albumin 02/72/5366 4.4  3.5 - 5.0 g/dL Final   AST 44/09/4740 56 (H)  15 - 41 U/L Final   ALT 03/27/2023 53 (H)  0 - 44 U/L Final   Alkaline Phosphatase 03/27/2023 90  38 - 126 U/L Final   Total Bilirubin 03/27/2023 1.0  0.3 - 1.2 mg/dL Final   GFR, Estimated 03/27/2023 >60  >60 mL/min Final   Comment: (NOTE) Calculated using the CKD-EPI Creatinine Equation (2021)    Anion gap 03/27/2023 9  5 - 15 Final   Performed at [facility], [address], [place], [place] [address]   Hgb A1c MFr Bld 03/27/2023 5.5  4.8 - 5.6 % Final   Comment: (NOTE)         Prediabetes: 5.7 - 6.4         Diabetes: >6.4         Glycemic control for adults with diabetes: <7.0    Mean Plasma Glucose 03/27/2023 111  mg/dL Final   Comment: (NOTE) Performed At: [facility] [address] [place], [place] [id number] Jolene Schimke MD JJ:[id number]    Alcohol, Ethyl (B) 03/27/2023 <10  <10 mg/dL Final   Comment: (NOTE) Lowest detectable limit for serum alcohol is 10 mg/dL.  For medical purposes only. Performed at [facility], [address], [place], [place] [address]    Cholesterol 03/27/2023 219 (H)  0 - 200 mg/dL Final   Triglycerides 93/23/5573 198 (H)  <150 mg/dL Final   HDL 22/08/5425 61  >40 mg/dL Final   Total CHOL/HDL Ratio 03/27/2023 3.6  RATIO Final   VLDL 03/27/2023 40  0 - 40 mg/dL Final   LDL Cholesterol 03/27/2023 118 (H)  0 - 99 mg/dL Final   Comment:        Total Cholesterol/HDL:CHD Risk Coronary Heart Disease Risk Table                     Men   Women  1/2 Average Risk   3.4   3.3  Average Risk       5.0  4.4  2 X Average Risk   9.6   7.1  3 X Average Risk  23.4   11.0        Use the calculated Patient Ratio above and the CHD Risk Table to determine the patient's CHD Risk.        ATP III  CLASSIFICATION (LDL):  <100     mg/dL   Optimal  161-096  mg/dL   Near or Above                    Optimal  130-159  mg/dL   Borderline  045-409  mg/dL   High  >811     mg/dL   Very High Performed at [facility], [address], [place], [place] [address]    TSH 03/27/2023 1.627  0.350 - 4.500 uIU/mL Final   Comment: Performed by a 3rd Generation assay with a functional sensitivity of <=0.01 uIU/mL. Performed at [facility], [address], [place], [place] [address]    POC Amphetamine UR 03/27/2023 None Detected  NONE DETECTED (Cut Off Level 1000 ng/mL) Final   POC Secobarbital (BAR) 03/27/2023 None Detected  NONE DETECTED (Cut Off Level 300 ng/mL) Final   POC Buprenorphine (BUP) 03/27/2023 None Detected  NONE DETECTED (Cut Off Level 10 ng/mL) Final   POC Oxazepam (BZO) 03/27/2023 None Detected  NONE DETECTED (Cut Off Level 300 ng/mL) Final   POC Cocaine UR 03/27/2023 Positive (A)  NONE DETECTED (Cut Off Level 300 ng/mL) Final   POC Methamphetamine UR 03/27/2023 None Detected  NONE DETECTED (Cut Off Level 1000 ng/mL) Final   POC Morphine 03/27/2023 None Detected  NONE DETECTED (Cut Off Level 300 ng/mL) Final   POC Methadone UR 03/27/2023 None Detected  NONE DETECTED (Cut Off Level 300 ng/mL) Final   POC Oxycodone UR 03/27/2023 None Detected  NONE DETECTED (Cut Off Level 100 ng/mL) Final   POC Marijuana UR 03/27/2023 None Detected  NONE DETECTED (Cut Off Level 50 ng/mL) Final   HIV Screen 4th Generation wRfx 03/27/2023 Non Reactive  Non Reactive Final   Performed at [facility], [address], [place], [place] [address]    Blood Alcohol level:  Lab Results  Component Value Date   ETH <10 03/27/2023   ETH 94 (H) 07/22/2011    Metabolic Disorder Labs: Lab Results  Component Value Date   HGBA1C 5.5 03/27/2023   MPG 111 03/27/2023   No results found for: "PROLACTIN" Lab Results  Component Value Date   CHOL 219 (H) 03/27/2023   TRIG 198 (H) 03/27/2023    HDL 61 03/27/2023   CHOLHDL 3.6 03/27/2023   VLDL 40 03/27/2023   LDLCALC 118 (H) 03/27/2023    Therapeutic Lab Levels: No results found for: "LITHIUM" No results found for: "VALPROATE" No results found for: "CBMZ"  Physical Findings   PHQ2-9    Flowsheet Row ED from 03/27/2023 in [facility] Most recent reading at 03/27/2023  3:23 PM ED from 03/27/2023 in [facility] Most recent reading at 03/27/2023 11:04 AM  PHQ-2 Total Score 3 3  PHQ-9 Total Score 12 13      Flowsheet Row ED from 03/27/2023 in [facility] Most recent reading at 03/27/2023  2:45 PM ED from 03/27/2023 in [facility] Most recent reading at 03/27/2023  9:14 AM ED from 03/11/2023 in [facility] Emergency Department at [facility] Most recent  reading at 03/11/2023  8:52 AM  C-SSRS RISK CATEGORY No Risk No Risk No Risk        Musculoskeletal  Strength & Muscle Tone: within normal limits Gait & Station: normal Patient leans: N/A  Psychiatric Specialty Exam  Presentation  General Appearance:  Appropriate for Environment  Eye Contact: Fair  Speech: Clear and Coherent  Speech Volume: Normal  Handedness: Right   Mood and Affect  Mood: Dysphoric  Affect: Congruent   Thought Process  Thought Processes: Coherent; Goal Directed  Descriptions of Associations:Intact  Orientation:Full (Time, Place and Person)  Thought Content:Logical  Diagnosis of Schizophrenia or Schizoaffective disorder in past: No    Hallucinations:Hallucinations: None  Ideas of Reference:None  Suicidal Thoughts:Suicidal Thoughts: No  Homicidal Thoughts:Homicidal Thoughts: No   Sensorium  Memory: Immediate Fair; Recent Fair; Remote Fair  Judgment: Fair  Insight: Fair   Art therapist  Concentration: Fair  Attention Span: Fair  Recall: [employer] of  Knowledge: Fair  Language: Fair   Psychomotor Activity  Psychomotor Activity: Psychomotor Activity: Normal   Assets  Assets: Communication Skills; Desire for Improvement; Leisure Time; Physical Health   Sleep  Sleep: Sleep: Poor Number of Hours of Sleep: 5   Nutritional Assessment (For OBS and [facility]BC admissions only) Has the patient had a weight loss or gain of 10 pounds or more in the last 3 months?: No Has the patient had a decrease in food intake/or appetite?: No Does the patient have dental problems?: No Does the patient have eating habits or behaviors that may be indicators of an eating disorder including binging or inducing vomiting?: No Has the patient recently lost weight without trying?: 0 Has the patient been eating poorly because of a decreased appetite?: 0 Malnutrition Screening Tool Score: 0    Physical Exam  Physical Exam ROS Blood pressure 111/79, pulse 60, temperature 98.4 F (36.9 C), temperature source Oral, resp. rate 16, SpO2 100%. There is no height or weight on file to calculate BMI.  Treatment Plan Summary: Daily contact with patient to assess and evaluate symptoms and progress in treatment and Medication management  Alcohol Use Disorder Alcohol Withdrawal EtOH <10, start with PRN ativan for withdrawal sxms -CIWA with Ativan as needed for CIWA greater than 10 -Thiamine 100 mg IM first day and PO after that -Multivitamin with minerals daily -Tylenol 650 mg every 6 hours as needed for pain -Zofran 4 mg every 6 hours as needed for nausea or vomiting -Imodium 2 to 4 mg as needed for diarrhea or loose stools  -Maalox/Mylanta 30 mL every 4 hours as needed for indigestion -Milk of Mag 30 mL as needed for constipation  PRN medications: -hydroxyzine 25 mg q6h PRN for anxiety -trazodone 50 mg qhs PRN for sleep  -START naltrexone 50 mg daily   Cocaine use d/o -encourage abstinence   Tobacco use d/o -continue nicotine patch  Park Pope,  MD 03/28/2023 11:18 AM

## 2023-03-28 NOTE — Group Note (Signed)
Group Topic: Healthy Self Image and Positive Change  Group Date: 03/28/2023 Start Time: 1740 End Time: 1810 Facilitators: Vonzell Schlatter B  Department: [facility]  Number of Participants: 3  Group Focus: daily focus and feeling awareness/expression Treatment Modality:  Psychoeducation Interventions utilized were support Purpose: express feelings and improve communication skills  Name: Frederick Ballard Date of Birth: 02-09-1986  MR: [id number]    Level of Participation: moderate Quality of Participation: attentive and cooperative Interactions with others: gave feedback Mood/Affect: positive Triggers (if applicable): n/a Cognition: coherent/clear Progress: Moderate Response: n/a Plan: follow-up needed  Patients Problems:  Patient Active Problem List   Diagnosis Date Noted   Substance induced mood disorder (HCC) 01/16/2021   Alcohol abuse 01/16/2021   Cocaine use disorder, moderate, in controlled environment (HCC) 01/16/2021   Polysubstance abuse (HCC) 07/24/2011   Schizoaffective disorder, depressive type (HCC) 07/24/2011

## 2023-03-28 NOTE — ED Notes (Signed)
Patient observed/assessed in dayroom watching tv. Patient alert and oriented x 4. Affect is flat. Patient denies pain and anxiety. He denies A/V/H. He denies having any thoughts/plan of self harm and harm towards others. Fluid and snack offered. Patient states that appetite has been good throughout the day. Verbalizes no further complaints at this time. Will continue to monitor and support.

## 2023-03-28 NOTE — ED Notes (Signed)
Patient is calm and cooperative and alert and oriented X 4. He denies SI/HI or AVH. Reports that he did not sleep well last night. Patient seen interacting minimally with peers but has inquired about group so is interested in participating. Patient currently in the dayroom watching television. No additional needs at this time. Will continue to monitor for safety.

## 2023-03-28 NOTE — ED Notes (Signed)
Patient observed resting quietly, eyes closed. Respirations equal and unlabored. Will continue to monitor for safety.  

## 2023-03-28 NOTE — ED Notes (Signed)
Patient observed resting quietly, eyes closed. Respirations equal and unlabored. Will continue to monitor for safety.

## 2023-03-28 NOTE — Group Note (Signed)
Group Topic: Recovery Basics  Group Date: 03/28/2023 Start Time: 2000 End Time: 2045 Facilitators: Rae Lips B  Department: [facility]  Number of Participants: 2  Group Focus: activities of daily living skills Treatment Modality:  Leisure Development Interventions utilized were leisure development Purpose: express feelings   Name: MANKIRAT IGLESIAS Date of Birth: Nov 19, 1985  MR: [id number]    Level of Participation: active Quality of Participation: attentive Interactions with others: gave feedback Mood/Affect: appropriate Triggers (if applicable): NA Cognition: coherent/clear Progress: Gaining insight Response: NA Plan: patient will be encouraged to keep going to groups.   Patients Problems:  Patient Active Problem List   Diagnosis Date Noted   Substance induced mood disorder (HCC) 01/16/2021   Alcohol abuse 01/16/2021   Cocaine use disorder, moderate, in controlled environment (HCC) 01/16/2021   Polysubstance abuse (HCC) 07/24/2011   Schizoaffective disorder, depressive type (HCC) 07/24/2011

## 2023-03-28 NOTE — ED Notes (Signed)
Patient observed/assessed in room in bed appearing in no immediate distress resting peacefully. Q15 minute checks continued by MHT and nursing staff. Will continue to monitor and support.

## 2023-03-29 DIAGNOSIS — F1410 Cocaine abuse, uncomplicated: Secondary | ICD-10-CM | POA: Diagnosis not present

## 2023-03-29 NOTE — ED Notes (Addendum)
Patient A&O X 4. He denies SI/HI or AVH. Patient interacts with staff and peers minimally but is appropriate. He denies any needs at this time. Will continue to monitor for safety.

## 2023-03-29 NOTE — Group Note (Signed)
Group Topic: Positive Affirmations  Group Date: 03/29/2023 Start Time: 1400 End Time: 1430 Facilitators: Vonzell Schlatter B  Department: [facility]  Number of Participants: 4  Group Focus: daily focus Treatment Modality:  Psychoeducation Interventions utilized were group exercise Purpose: reinforce self-care  Name: Frederick Ballard Date of Birth: 09-19-1985  MR: [id number]    Level of Participation: moderate Quality of Participation: attentive and cooperative Interactions with others: gave feedback Mood/Affect: positive Triggers (if applicable): n/a Cognition: coherent/clear Progress: Moderate Response: n/a Plan: follow-up needed  Patients Problems:  Patient Active Problem List   Diagnosis Date Noted   Substance induced mood disorder (HCC) 01/16/2021   Alcohol abuse 01/16/2021   Cocaine use disorder, moderate, in controlled environment (HCC) 01/16/2021   Polysubstance abuse (HCC) 07/24/2011   Schizoaffective disorder, depressive type (HCC) 07/24/2011

## 2023-03-29 NOTE — ED Notes (Signed)
Pt is in the  Dayroom and getting ready for groups. NAD. Respirations even and unlabored. Will monitor for safety.

## 2023-03-29 NOTE — ED Notes (Signed)
Patient is the bedroom calm and sleeping. NAD. Respirations are even and unlabored. Will continue to monitor for safety.

## 2023-03-29 NOTE — ED Provider Notes (Signed)
Behavioral Health Progress Note  Date and Time: 03/29/2023 10:41 AM Name: Frederick Ballard MRN:  [id number]  Subjective: Patient seen and assessed bedside.  Denies SI/HI/AVH.  Reports no significant alcohol withdrawal at this time. He denied any side effects from naltrexone but opted to not wanting to take any medications when he goes to [facility] so wants to stop naltrexone. Discussed risks and benefits again but patient continues to not want to be on any MAT for alcohol use disorder. Awaiting [facility] placement.  Diagnosis:  Final diagnoses:  Cocaine use disorder (HCC)  Alcohol use disorder    Total Time spent with patient: 45 minutes  Past Psychiatric History: MDD, AUD, stimulant use  Past Medical History: none Family History: father has a history of cocaine, heroin, and alcohol abuse Social History: homeless for the past month due to being in and out of his sister's house using drugs. He denies legal issues. He has two children. Adult daughter and 67 year old son.   Additional Social History:        Sleep: Good  Appetite:  Good  Current Medications:  Current Facility-Administered Medications  Medication Dose Route Frequency Provider Last Rate Last Admin   acetaminophen (TYLENOL) tablet 650 mg  650 mg Oral Q6H PRN Kizzie Ide B, MD       hydrOXYzine (ATARAX) tablet 25 mg  25 mg Oral Q6H PRN Kizzie Ide B, MD       loperamide (IMODIUM) capsule 2-4 mg  2-4 mg Oral PRN Kizzie Ide B, MD       LORazepam (ATIVAN) tablet 1 mg  1 mg Oral Q6H PRN Kizzie Ide B, MD       multivitamin with minerals tablet 1 tablet  1 tablet Oral Daily Kizzie Ide B, MD   1 tablet at 03/29/23 4098   nicotine (NICODERM CQ - dosed in mg/24 hours) patch 14 mg  14 mg Transdermal Daily Kizzie Ide B, MD   14 mg at 03/29/23 0937   ondansetron (ZOFRAN-ODT) disintegrating tablet 4 mg  4 mg Oral Q6H PRN Kizzie Ide B, MD       thiamine (VITAMIN B1) tablet 100 mg  100 mg Oral Daily Kizzie Ide  B, MD   100 mg at 03/29/23 0936   traZODone (DESYREL) tablet 50 mg  50 mg Oral QHS PRN Lance Muss, MD   50 mg at 03/28/23 2107   No current outpatient medications on file.    Labs  Lab Results:  Admission on 03/27/2023, Discharged on 03/27/2023  Component Date Value Ref Range Status   WBC 03/27/2023 9.3  4.0 - 10.5 K/uL Final   RBC 03/27/2023 4.84  4.22 - 5.81 MIL/uL Final   Hemoglobin 03/27/2023 14.5  13.0 - 17.0 g/dL Final   HCT 11/91/4782 43.5  39.0 - 52.0 % Final   MCV 03/27/2023 89.9  80.0 - 100.0 fL Final   MCH 03/27/2023 30.0  26.0 - 34.0 pg Final   MCHC 03/27/2023 33.3  30.0 - 36.0 g/dL Final   RDW 95/62/1308 12.8  11.5 - 15.5 % Final   Platelets 03/27/2023 199  150 - 400 K/uL Final   nRBC 03/27/2023 0.0  0.0 - 0.2 % Final   Neutrophils Relative % 03/27/2023 46  % Final   Neutro Abs 03/27/2023 4.3  1.7 - 7.7 K/uL Final   Lymphocytes Relative 03/27/2023 42  % Final   Lymphs Abs 03/27/2023 3.9  0.7 - 4.0 K/uL Final   Monocytes Relative 03/27/2023 8  %  Final   Monocytes Absolute 03/27/2023 0.8  0.1 - 1.0 K/uL Final   Eosinophils Relative 03/27/2023 3  % Final   Eosinophils Absolute 03/27/2023 0.3  0.0 - 0.5 K/uL Final   Basophils Relative 03/27/2023 1  % Final   Basophils Absolute 03/27/2023 0.1  0.0 - 0.1 K/uL Final   Immature Granulocytes 03/27/2023 0  % Final   Abs Immature Granulocytes 03/27/2023 0.02  0.00 - 0.07 K/uL Final   Performed at [facility], [address], [place], [place] [address]   Sodium 03/27/2023 136  135 - 145 mmol/L Final   Potassium 03/27/2023 4.0  3.5 - 5.1 mmol/L Final   Chloride 03/27/2023 101  98 - 111 mmol/L Final   CO2 03/27/2023 26  22 - 32 mmol/L Final   Glucose, Bld 03/27/2023 77  70 - 99 mg/dL Final   Glucose reference range applies only to samples taken after fasting for at least 8 hours.   BUN 03/27/2023 12  6 - 20 mg/dL Final   Creatinine, Ser 03/27/2023 0.97  0.61 - 1.24 mg/dL Final   Calcium 57/32/2025 9.4  8.9 - 10.3  mg/dL Final   Total Protein 42/70/6237 7.4  6.5 - 8.1 g/dL Final   Albumin 62/83/1517 4.4  3.5 - 5.0 g/dL Final   AST 61/60/7371 56 (H)  15 - 41 U/L Final   ALT 03/27/2023 53 (H)  0 - 44 U/L Final   Alkaline Phosphatase 03/27/2023 90  38 - 126 U/L Final   Total Bilirubin 03/27/2023 1.0  0.3 - 1.2 mg/dL Final   GFR, Estimated 03/27/2023 >60  >60 mL/min Final   Comment: (NOTE) Calculated using the CKD-EPI Creatinine Equation (2021)    Anion gap 03/27/2023 9  5 - 15 Final   Performed at [facility], [address], [place], [place] [address]   Hgb A1c MFr Bld 03/27/2023 5.5  4.8 - 5.6 % Final   Comment: (NOTE)         Prediabetes: 5.7 - 6.4         Diabetes: >6.4         Glycemic control for adults with diabetes: <7.0    Mean Plasma Glucose 03/27/2023 111  mg/dL Final   Comment: (NOTE) Performed At: [facility] [address] [place], [place] [id number] Jolene Schimke MD JK:[id number]    Alcohol, Ethyl (B) 03/27/2023 <10  <10 mg/dL Final   Comment: (NOTE) Lowest detectable limit for serum alcohol is 10 mg/dL.  For medical purposes only. Performed at [facility], [address] [place], [place] [address]    Cholesterol 03/27/2023 219 (H)  0 - 200 mg/dL Final   Triglycerides 78/93/8101 198 (H)  <150 mg/dL Final   HDL 75/04/2584 61  >40 mg/dL Final   Total CHOL/HDL Ratio 03/27/2023 3.6  RATIO Final   VLDL 03/27/2023 40  0 - 40 mg/dL Final   LDL Cholesterol 03/27/2023 118 (H)  0 - 99 mg/dL Final   Comment:        Total Cholesterol/HDL:CHD Risk Coronary Heart Disease Risk Table                     Men   Women  1/2 Average Risk   3.4   3.3  Average Risk       5.0   4.4  2 X Average Risk   9.6   7.1  3 X Average Risk  23.4   11.0  Use the calculated Patient Ratio above and the CHD Risk Table to determine the patient's CHD Risk.        ATP III CLASSIFICATION (LDL):  <100     mg/dL   Optimal  295-621  mg/dL   Near or Above                     Optimal  130-159  mg/dL   Borderline  308-657  mg/dL   High  >846     mg/dL   Very High Performed at [facility], [address] [place], [place] [address]    TSH 03/27/2023 1.627  0.350 - 4.500 uIU/mL Final   Comment: Performed by a 3rd Generation assay with a functional sensitivity of <=0.01 uIU/mL. Performed at [facility], [address], [place], [place] [address]    POC Amphetamine UR 03/27/2023 None Detected  NONE DETECTED (Cut Off Level 1000 ng/mL) Final   POC Secobarbital (BAR) 03/27/2023 None Detected  NONE DETECTED (Cut Off Level 300 ng/mL) Final   POC Buprenorphine (BUP) 03/27/2023 None Detected  NONE DETECTED (Cut Off Level 10 ng/mL) Final   POC Oxazepam (BZO) 03/27/2023 None Detected  NONE DETECTED (Cut Off Level 300 ng/mL) Final   POC Cocaine UR 03/27/2023 Positive (A)  NONE DETECTED (Cut Off Level 300 ng/mL) Final   POC Methamphetamine UR 03/27/2023 None Detected  NONE DETECTED (Cut Off Level 1000 ng/mL) Final   POC Morphine 03/27/2023 None Detected  NONE DETECTED (Cut Off Level 300 ng/mL) Final   POC Methadone UR 03/27/2023 None Detected  NONE DETECTED (Cut Off Level 300 ng/mL) Final   POC Oxycodone UR 03/27/2023 None Detected  NONE DETECTED (Cut Off Level 100 ng/mL) Final   POC Marijuana UR 03/27/2023 None Detected  NONE DETECTED (Cut Off Level 50 ng/mL) Final   HIV Screen 4th Generation wRfx 03/27/2023 Non Reactive  Non Reactive Final   Performed at [facility], [address] [place], [place] [address]    Blood Alcohol level:  Lab Results  Component Value Date   ETH <10 03/27/2023   ETH 94 (H) 07/22/2011    Metabolic Disorder Labs: Lab Results  Component Value Date   HGBA1C 5.5 03/27/2023   MPG 111 03/27/2023   No results found for: "PROLACTIN" Lab Results  Component Value Date   CHOL 219 (H) 03/27/2023   TRIG 198 (H) 03/27/2023   HDL 61 03/27/2023   CHOLHDL 3.6 03/27/2023   VLDL 40 03/27/2023   LDLCALC 118 (H) 03/27/2023     Therapeutic Lab Levels: No results found for: "LITHIUM" No results found for: "VALPROATE" No results found for: "CBMZ"  Physical Findings   PHQ2-9    Flowsheet Row ED from 03/27/2023 in [facility] Most recent reading at 03/27/2023  3:23 PM ED from 03/27/2023 in [facility] Most recent reading at 03/27/2023 11:04 AM  PHQ-2 Total Score 3 3  PHQ-9 Total Score 12 13      Flowsheet Row ED from 03/27/2023 in [facility] Most recent reading at 03/27/2023  2:45 PM ED from 03/27/2023 in [facility] Most recent reading at 03/27/2023  9:14 AM ED from 03/11/2023 in [facility] Department at [facility] Most recent reading at 03/11/2023  8:52 AM  C-SSRS RISK CATEGORY No Risk No Risk No Risk        Musculoskeletal  Strength & Muscle Tone:  within normal limits Gait & Station: normal Patient leans: N/A  Psychiatric Specialty Exam  Presentation  General Appearance:  Appropriate for Environment; Casual  Eye Contact: Good  Speech: Clear and Coherent; Normal Rate  Speech Volume: Normal  Handedness: Right   Mood and Affect  Mood: Euthymic  Affect: Appropriate; Congruent   Thought Process  Thought Processes: Coherent; Goal Directed; Linear  Descriptions of Associations:Intact  Orientation:Full (Time, Place and Person)  Thought Content:Logical  Diagnosis of Schizophrenia or Schizoaffective disorder in past: No    Hallucinations:Hallucinations: None   Ideas of Reference:None  Suicidal Thoughts:Suicidal Thoughts: No   Homicidal Thoughts:Homicidal Thoughts: No    Sensorium  Memory: Remote Good  Judgment: Good  Insight: Good   Executive Functions  Concentration: Good  Attention Span: Good  Recall: Good  Fund of Knowledge: Good  Language: Good   Psychomotor Activity  Psychomotor Activity: Psychomotor  Activity: Normal    Assets  Assets: Communication Skills; Resilience; Desire for Improvement   Sleep  Sleep: Sleep: Good    No data recorded   Physical Exam  Physical Exam ROS Blood pressure 120/77, pulse 69, temperature 98.3 F (36.8 C), temperature source Tympanic, resp. rate 18, SpO2 100%. There is no height or weight on file to calculate BMI.  Treatment Plan Summary: Daily contact with patient to assess and evaluate symptoms and progress in treatment and Medication management  Alcohol Use Disorder Alcohol Withdrawal EtOH <10, start with PRN ativan for withdrawal sxms -CIWA with Ativan as needed for CIWA greater than 10 -Thiamine 100 mg IM first day and PO after that -Multivitamin with minerals daily -Tylenol 650 mg every 6 hours as needed for pain -Zofran 4 mg every 6 hours as needed for nausea or vomiting -Imodium 2 to 4 mg as needed for diarrhea or loose stools  -Maalox/Mylanta 30 mL every 4 hours as needed for indigestion -Milk of Mag 30 mL as needed for constipation  PRN medications: -hydroxyzine 25 mg q6h PRN for anxiety -trazodone 50 mg qhs PRN for sleep  -DISCONTINUE naltrexone 50 mg daily   Cocaine use d/o -encourage abstinence   Tobacco use d/o -continue nicotine patch  Park Pope, MD 03/29/2023 10:41 AM

## 2023-03-29 NOTE — ED Notes (Signed)
Patient observed/assessed in room in bed appearing in no immediate distress resting peacefully. Q15 minute checks continued by MHT and nursing staff. Will continue to monitor and support. 

## 2023-03-29 NOTE — ED Notes (Signed)
Patient currently in the dining room watching television. Snack offered but patient declined. Patient is calm and cooperative. Will continue to monitor for safety.

## 2023-03-29 NOTE — ED Notes (Signed)
Patient observed resting quietly, eyes closed. Respirations equal and unlabored. Will continue to monitor for safety.  

## 2023-03-29 NOTE — Group Note (Unsigned)
Group Topic: Positive Affirmations  Group Date: 03/29/2023 Start Time: 2000 End Time: 2045 Facilitators: Rae Lips B  Department: [facility]  Number of Participants: 3  Group Focus: acceptance and activities of daily living skills Treatment Modality:  Individual Therapy and Leisure Development Interventions utilized were leisure development Purpose: express feelings  Name: Frederick Ballard Date of Birth: 1985/09/02  MR: [id number]    Level of Participation: active Quality of Participation: attentive Interactions with others: gave feedback Mood/Affect: appropriate Triggers (if applicable): NA Cognition: coherent/clear Progress: Gaining insight Response: NA Plan: patient will be encouraged to keep going to group  Patients Problems:  Patient Active Problem List   Diagnosis Date Noted   Substance induced mood disorder (HCC) 01/16/2021   Alcohol abuse 01/16/2021   Cocaine use disorder, moderate, in controlled environment (HCC) 01/16/2021   Polysubstance abuse (HCC) 07/24/2011   Schizoaffective disorder, depressive type (HCC) 07/24/2011

## 2023-03-30 DIAGNOSIS — F1410 Cocaine abuse, uncomplicated: Secondary | ICD-10-CM | POA: Diagnosis not present

## 2023-03-30 LAB — GC/CHLAMYDIA PROBE AMP (~~LOC~~) NOT AT ARMC
Chlamydia: NEGATIVE
Comment: NEGATIVE
Comment: NORMAL
Neisseria Gonorrhea: NEGATIVE

## 2023-03-30 MED ORDER — NALTREXONE HCL 50 MG PO TABS
50.0000 mg | ORAL_TABLET | Freq: Every day | ORAL | Status: DC
  Administered 2023-03-30 – 2023-03-31 (×2): 50 mg via ORAL
  Filled 2023-03-30 (×2): qty 1

## 2023-03-30 MED ORDER — GABAPENTIN 300 MG PO CAPS: 300.0000 mg | ORAL_CAPSULE | Freq: Every day | ORAL | Status: DC

## 2023-03-30 NOTE — Group Note (Signed)
Group Topic: Feelings about Diagnosis AA Group Group Date: 03/30/2023 Start Time: 1010 End Time: 1110 Facilitators: Vonzell Schlatter B  Department: [facility]  Number of Participants: 4  Group Focus: daily focus Treatment Modality:  Psychoeducation Interventions utilized were patient education and support Purpose: regain self-worth and reinforce self-care  Name: KRISTHIAN BABAR Date of Birth: [birth year]  MR: [id number]    Level of Participation: moderate Quality of Participation: attentive and cooperative Interactions with others: gave feedback Mood/Affect: positive Triggers (if applicable): n/a Cognition: coherent/clear Progress: Moderate Response: n/a Plan: follow-up needed  Patients Problems:  Patient Active Problem List   Diagnosis Date Noted   Substance induced mood disorder (HCC) 01/16/2021   Alcohol abuse 01/16/2021   Cocaine use disorder, moderate, in controlled environment (HCC) 01/16/2021   Polysubstance abuse (HCC) 07/24/2011   Schizoaffective disorder, depressive type (HCC) 07/24/2011

## 2023-03-30 NOTE — ED Provider Notes (Cosign Needed Addendum)
Behavioral Health Progress Note  Date and Time: 03/30/2023 10:01 AM Name: Frederick Ballard MRN:  [id number]  Subjective: Frederick Ballard is a 37 y.o. male patient with a reported psychiatric history significant for depression and substance abuse, (cocaine and alcohol use) who presents to the [facility] voluntary requesting detox.   Patient seen this morning. He notes feeling well. He denies issues over the weekend. He remembers taking naltrexone once and then stopping the medications. He denies SI, HI, and AVH. He reports good sleep and appetite. He denies withdrawal symptoms. He continues to plan to go to [facility] for residential treatment. When discussing his goals for today, he had some difficulty answering. I encouraged patient to attend group sessions for today's goal and he agreed. Patient is agreeable to restarting naltrexone to aid with alcohol cravings.  Diagnosis:  Final diagnoses:  Cocaine use disorder (HCC)  Alcohol use disorder    Total Time spent with patient: 45 minutes  Past Psychiatric History: MDD, AUD, stimulant use  Past Medical History: none Family History: father has a history of cocaine, heroin, and alcohol abuse Social History: homeless for the past month due to being in and out of his sister's house using drugs. He denies legal issues. He has two children. Adult daughter and 52 year old son.   Additional Social History:        Sleep: Good  Appetite:  Good  Current Medications:  Current Facility-Administered Medications  Medication Dose Route Frequency Provider Last Rate Last Admin   acetaminophen (TYLENOL) tablet 650 mg  650 mg Oral Q6H PRN Kizzie Ide B, MD       gabapentin (NEURONTIN) capsule 300 mg  300 mg Oral QHS Kizzie Ide B, MD       hydrOXYzine (ATARAX) tablet 25 mg  25 mg Oral Q6H PRN Kizzie Ide B, MD       loperamide (IMODIUM) capsule 2-4 mg  2-4 mg Oral PRN Kizzie Ide B, MD       LORazepam (ATIVAN) tablet 1 mg  1 mg Oral Q6H PRN Kizzie Ide B, MD       multivitamin with minerals tablet 1 tablet  1 tablet Oral Daily Kizzie Ide B, MD   1 tablet at 03/30/23 0957   nicotine (NICODERM CQ - dosed in mg/24 hours) patch 14 mg  14 mg Transdermal Daily Kizzie Ide B, MD   14 mg at 03/30/23 0957   ondansetron (ZOFRAN-ODT) disintegrating tablet 4 mg  4 mg Oral Q6H PRN Kizzie Ide B, MD       thiamine (VITAMIN B1) tablet 100 mg  100 mg Oral Daily Kizzie Ide B, MD   100 mg at 03/30/23 0957   traZODone (DESYREL) tablet 50 mg  50 mg Oral QHS PRN Lance Muss, MD   50 mg at 03/29/23 2134   No current outpatient medications on file.    Labs  Lab Results:  Admission on 03/27/2023, Discharged on 03/27/2023  Component Date Value Ref Range Status   WBC 03/27/2023 9.3  4.0 - 10.5 K/uL Final   RBC 03/27/2023 4.84  4.22 - 5.81 MIL/uL Final   Hemoglobin 03/27/2023 14.5  13.0 - 17.0 g/dL Final   HCT 84/13/2440 43.5  39.0 - 52.0 % Final   MCV 03/27/2023 89.9  80.0 - 100.0 fL Final   MCH 03/27/2023 30.0  26.0 - 34.0 pg Final   MCHC 03/27/2023 33.3  30.0 - 36.0 g/dL Final   RDW 05/10/2535 12.8  11.5 - 15.5 %  Final   Platelets 03/27/2023 199  150 - 400 K/uL Final   nRBC 03/27/2023 0.0  0.0 - 0.2 % Final   Neutrophils Relative % 03/27/2023 46  % Final   Neutro Abs 03/27/2023 4.3  1.7 - 7.7 K/uL Final   Lymphocytes Relative 03/27/2023 42  % Final   Lymphs Abs 03/27/2023 3.9  0.7 - 4.0 K/uL Final   Monocytes Relative 03/27/2023 8  % Final   Monocytes Absolute 03/27/2023 0.8  0.1 - 1.0 K/uL Final   Eosinophils Relative 03/27/2023 3  % Final   Eosinophils Absolute 03/27/2023 0.3  0.0 - 0.5 K/uL Final   Basophils Relative 03/27/2023 1  % Final   Basophils Absolute 03/27/2023 0.1  0.0 - 0.1 K/uL Final   Immature Granulocytes 03/27/2023 0  % Final   Abs Immature Granulocytes 03/27/2023 0.02  0.00 - 0.07 K/uL Final   Performed at [facility], [address], [place], [place] [address]   Sodium 03/27/2023 136  135 - 145  mmol/L Final   Potassium 03/27/2023 4.0  3.5 - 5.1 mmol/L Final   Chloride 03/27/2023 101  98 - 111 mmol/L Final   CO2 03/27/2023 26  22 - 32 mmol/L Final   Glucose, Bld 03/27/2023 77  70 - 99 mg/dL Final   Glucose reference range applies only to samples taken after fasting for at least 8 hours.   BUN 03/27/2023 12  6 - 20 mg/dL Final   Creatinine, Ser 03/27/2023 0.97  0.61 - 1.24 mg/dL Final   Calcium 09/81/1914 9.4  8.9 - 10.3 mg/dL Final   Total Protein 78/29/5621 7.4  6.5 - 8.1 g/dL Final   Albumin 30/86/5784 4.4  3.5 - 5.0 g/dL Final   AST 69/62/9528 56 (H)  15 - 41 U/L Final   ALT 03/27/2023 53 (H)  0 - 44 U/L Final   Alkaline Phosphatase 03/27/2023 90  38 - 126 U/L Final   Total Bilirubin 03/27/2023 1.0  0.3 - 1.2 mg/dL Final   GFR, Estimated 03/27/2023 >60  >60 mL/min Final   Comment: (NOTE) Calculated using the CKD-EPI Creatinine Equation (2021)    Anion gap 03/27/2023 9  5 - 15 Final   Performed at [facility], [address] [place], [place] [address]   Hgb A1c MFr Bld 03/27/2023 5.5  4.8 - 5.6 % Final   Comment: (NOTE)         Prediabetes: 5.7 - 6.4         Diabetes: >6.4         Glycemic control for adults with diabetes: <7.0    Mean Plasma Glucose 03/27/2023 111  mg/dL Final   Comment: (NOTE) Performed At: [facility] [address] [place], [place] [id number] Jolene Schimke MD [id number]    Alcohol, Ethyl (B) 03/27/2023 <10  <10 mg/dL Final   Comment: (NOTE) Lowest detectable limit for serum alcohol is 10 mg/dL.  For medical purposes only. Performed at [facility], [address] [place], [place] [address]    Cholesterol 03/27/2023 219 (H)  0 - 200 mg/dL Final   Triglycerides 56/43/3295 198 (H)  <150 mg/dL Final   HDL 18/84/1660 61  >40 mg/dL Final   Total CHOL/HDL Ratio 03/27/2023 3.6  RATIO Final   VLDL 03/27/2023 40  0 - 40 mg/dL Final   LDL Cholesterol 03/27/2023 118 (H)  0 - 99 mg/dL Final   Comment:        Total  Cholesterol/HDL:CHD  Risk Coronary Heart Disease Risk Table                     Men   Women  1/2 Average Risk   3.4   3.3  Average Risk       5.0   4.4  2 X Average Risk   9.6   7.1  3 X Average Risk  23.4   11.0        Use the calculated Patient Ratio above and the CHD Risk Table to determine the patient's CHD Risk.        ATP III CLASSIFICATION (LDL):  <100     mg/dL   Optimal  536-644  mg/dL   Near or Above                    Optimal  130-159  mg/dL   Borderline  034-742  mg/dL   High  >595     mg/dL   Very High Performed at [facility], [address], [place], [place] [address]    TSH 03/27/2023 1.627  0.350 - 4.500 uIU/mL Final   Comment: Performed by a 3rd Generation assay with a functional sensitivity of <=0.01 uIU/mL. Performed at [facility], [address], [place], [place] [address]    POC Amphetamine UR 03/27/2023 None Detected  NONE DETECTED (Cut Off Level 1000 ng/mL) Final   POC Secobarbital (BAR) 03/27/2023 None Detected  NONE DETECTED (Cut Off Level 300 ng/mL) Final   POC Buprenorphine (BUP) 03/27/2023 None Detected  NONE DETECTED (Cut Off Level 10 ng/mL) Final   POC Oxazepam (BZO) 03/27/2023 None Detected  NONE DETECTED (Cut Off Level 300 ng/mL) Final   POC Cocaine UR 03/27/2023 Positive (A)  NONE DETECTED (Cut Off Level 300 ng/mL) Final   POC Methamphetamine UR 03/27/2023 None Detected  NONE DETECTED (Cut Off Level 1000 ng/mL) Final   POC Morphine 03/27/2023 None Detected  NONE DETECTED (Cut Off Level 300 ng/mL) Final   POC Methadone UR 03/27/2023 None Detected  NONE DETECTED (Cut Off Level 300 ng/mL) Final   POC Oxycodone UR 03/27/2023 None Detected  NONE DETECTED (Cut Off Level 100 ng/mL) Final   POC Marijuana UR 03/27/2023 None Detected  NONE DETECTED (Cut Off Level 50 ng/mL) Final   HIV Screen 4th Generation wRfx 03/27/2023 Non Reactive  Non Reactive Final   Performed at [facility], [address] [place], [place] [address]    Blood  Alcohol level:  Lab Results  Component Value Date   ETH <10 03/27/2023   ETH 94 (H) 07/22/2011    Metabolic Disorder Labs: Lab Results  Component Value Date   HGBA1C 5.5 03/27/2023   MPG 111 03/27/2023   No results found for: "PROLACTIN" Lab Results  Component Value Date   CHOL 219 (H) 03/27/2023   TRIG 198 (H) 03/27/2023   HDL 61 03/27/2023   CHOLHDL 3.6 03/27/2023   VLDL 40 03/27/2023   LDLCALC 118 (H) 03/27/2023    Therapeutic Lab Levels: No results found for: "LITHIUM" No results found for: "VALPROATE" No results found for: "CBMZ"  Physical Findings   PHQ2-9    Flowsheet Row ED from 03/27/2023 in [facility] Most recent reading at 03/30/2023  9:45 AM ED from 03/27/2023 in [facility] Most recent reading at 03/27/2023 11:04 AM  PHQ-2 Total Score 3 3  PHQ-9 Total Score 11 13  Flowsheet Row ED from 03/27/2023 in [facility] Most recent reading at 03/27/2023  2:45 PM ED from 03/27/2023 in [facility] Most recent reading at 03/27/2023  9:14 AM ED from 03/11/2023 in [facility] Emergency Department at [facility] Most recent reading at 03/11/2023  8:52 AM  C-SSRS RISK CATEGORY No Risk No Risk No Risk        Musculoskeletal  Strength & Muscle Tone: within normal limits Gait & Station: normal Patient leans: N/A  Psychiatric Specialty Exam  Presentation  General Appearance:  Appropriate for Environment; Fairly Groomed  Eye Contact: Good  Speech: Clear and Coherent; Normal Rate  Speech Volume: Normal  Handedness: Right   Mood and Affect  Mood: Euthymic  Affect: Appropriate; Congruent   Thought Process  Thought Processes: Coherent; Goal Directed; Linear  Descriptions of Associations:Intact  Orientation:Full (Time, Place and Person)  Thought Content:Logical  Diagnosis of Schizophrenia or Schizoaffective disorder in  past: No    Hallucinations:Hallucinations: None   Ideas of Reference:None  Suicidal Thoughts:Suicidal Thoughts: No   Homicidal Thoughts:Homicidal Thoughts: No    Sensorium  Memory: Immediate Good; Recent Good; Remote Good  Judgment: Fair  Insight: Fair   Art therapist  Concentration: Good  Attention Span: Good  Recall: Good  Fund of Knowledge: Good  Language: Good   Psychomotor Activity  Psychomotor Activity: Psychomotor Activity: Normal    Assets  Assets: Communication Skills; Resilience; Desire for Improvement   Sleep  Sleep: Sleep: Fair    No data recorded   Physical Exam  Physical Exam Vitals reviewed.  Constitutional:      Appearance: Normal appearance.  HENT:     Head: Normocephalic and atraumatic.  Cardiovascular:     Rate and Rhythm: Normal rate.  Pulmonary:     Effort: Pulmonary effort is normal.  Neurological:     Mental Status: He is alert.    Review of Systems  Constitutional:  Negative for chills and fever.  Cardiovascular:  Negative for chest pain and palpitations.  Gastrointestinal:  Negative for nausea and vomiting.  Psychiatric/Behavioral:  Positive for substance abuse. Negative for hallucinations and suicidal ideas. The patient is nervous/anxious. The patient does not have insomnia.    Blood pressure 111/72, pulse 70, temperature 98.1 F (36.7 C), temperature source Oral, resp. rate 16, SpO2 100%. There is no height or weight on file to calculate BMI.  Treatment Plan Summary: Daily contact with patient to assess and evaluate symptoms and progress in treatment and Medication management  Alcohol Use Disorder Alcohol Withdrawal -D/c CIWA as score have been 0 for >24 hours -Start naltrexone 50 mg daily for alcohol cravings -Thiamine 100 mg IM first day and PO after that -Multivitamin with minerals daily -Tylenol 650 mg every 6 hours as needed for pain -Zofran 4 mg every 6 hours as needed for nausea or  vomiting -Imodium 2 to 4 mg as needed for diarrhea or loose stools  -Maalox/Mylanta 30 mL every 4 hours as needed for indigestion -Milk of Mag 30 mL as needed for constipation  -hydroxyzine 25 mg q6h PRN for anxiety -trazodone 50 mg qhs PRN for sleep    Cocaine use d/o -encourage abstinence   Tobacco use d/o -continue nicotine patch  Lance Muss, MD 03/30/2023 10:01 AM

## 2023-03-30 NOTE — ED Notes (Addendum)
Patient A&Ox4. Denies intent to harm self/others when asked. Denies A/VH. Patient denies any physical complaints when asked. No acute distress noted. Pt states, "I'm hoping to get into [facility] and then a [facility]". Support and encouragement provided. Routine safety checks conducted according to facility protocol. Encouraged patient to notify staff if thoughts of harm toward self or others arise. Patient verbalize understanding and agreement. Will continue to monitor for safety.

## 2023-03-30 NOTE — ED Notes (Signed)
Pt is in the dayroom watching TV with peers. Pt denies SI/HI/AVH. No acute distress noted. Will continue to monitor for safety.

## 2023-03-30 NOTE — ED Notes (Signed)
Pt sleeping in no acute distress. RR even and unlabored. Environment secured. Will continue to monitor for safety.

## 2023-03-30 NOTE — ED Notes (Signed)
Pt sitting in dayroom interacting with peers. No acute distress noted. No concerns voiced. Informed pt to notify staff with any needs or assistance. Pt verbalized understanding or agreement. Will continue to monitor for safety.

## 2023-03-30 NOTE — ED Notes (Signed)
Patient is the bedroom calm and sleeping. NAD. Respirations are even and unlabored. Will continue to monitor for safety.

## 2023-03-30 NOTE — Discharge Planning (Signed)
LCSW spoke with the patient on this morning regarding disposition plan. Patient reports he came to detox in order to get into [facility] once he is stable for discharge. LCSW informed him that due to his insurance he would be denied by [facility]. Patient reports he is not sure how he has insurance, however expressed understanding and stated he is not familiar with any other facility but has provided permission for LCSW to send his clinicals out for review. Patient aware that once update is provided, LCSW will follow up to inform.   Patient has been sent to the following facilities for review:   [facility] [place], [place] Clinical Social Worker [facility] Ph: [phone]

## 2023-03-30 NOTE — Group Note (Signed)
Group Topic: Communication  Group Date: 03/30/2023 Start Time: 2000 End Time: 2030 Facilitators: Rae Lips B  Department: [facility]  Number of Participants: 3  Group Focus: abuse issues, acceptance, and activities of daily living skills Treatment Modality:  Leisure Development Interventions utilized were leisure development Purpose: express feelings  Name: Frederick Ballard Date of Birth: [birth year]  MR: [id number]    Level of Participation: active Quality of Participation: attentive Interactions with others: gave feedback Mood/Affect: appropriate Triggers (if applicable): NA Cognition: coherent/clear Progress: Gaining insight Response: NA Plan: patient will be encouraged to keep going to groups.   Patients Problems:  Patient Active Problem List   Diagnosis Date Noted   Substance induced mood disorder (HCC) 01/16/2021   Alcohol abuse 01/16/2021   Cocaine use disorder, moderate, in controlled environment (HCC) 01/16/2021   Polysubstance abuse (HCC) 07/24/2011   Schizoaffective disorder, depressive type (HCC) 07/24/2011

## 2023-03-31 ENCOUNTER — Encounter (HOSPITAL_COMMUNITY): Payer: Self-pay | Admitting: Psychiatry

## 2023-03-31 DIAGNOSIS — F1410 Cocaine abuse, uncomplicated: Secondary | ICD-10-CM | POA: Diagnosis not present

## 2023-03-31 MED ORDER — NICOTINE 14 MG/24HR TD PT24: 14.0000 mg | MEDICATED_PATCH | Freq: Every day | TRANSDERMAL | 0 refills | Status: DC

## 2023-03-31 MED ORDER — NALTREXONE HCL 50 MG PO TABS: 50.0000 mg | ORAL_TABLET | Freq: Every day | ORAL | 0 refills | Status: DC

## 2023-03-31 MED ORDER — TRAZODONE HCL 50 MG PO TABS: 50.0000 mg | ORAL_TABLET | Freq: Every evening | ORAL | 0 refills | Status: DC | PRN

## 2023-03-31 NOTE — Group Note (Signed)
Group Topic: Recovery Basics  Group Date: 03/31/2023 Start Time: 1045 End Time: 1120 Facilitators: Priscille Kluver, NT  Department: [facility]  Number of Participants: 3  Group Focus: abuse issues Treatment Modality:  Cognitive Behavioral Therapy Interventions utilized were clarification Purpose: enhance coping skills  Name: Frederick Ballard Date of Birth: 1986/02/03  MR: [id number]    Level of Participation: minimal Quality of Participation: attentive Mood/Affect: appropriate Cognition: coherent/clear Progress: Gaining insight plan: changes to treatment plan  Patients Problems:  Patient Active Problem List   Diagnosis Date Noted   Substance induced mood disorder (HCC) 01/16/2021   Alcohol abuse 01/16/2021   Cocaine use disorder, moderate, in controlled environment (HCC) 01/16/2021   Polysubstance abuse (HCC) 07/24/2011   Schizoaffective disorder, depressive type (HCC) 07/24/2011

## 2023-03-31 NOTE — Discharge Planning (Signed)
LCSW followed up with [facility] regarding referral. Per Alphonzo Lemmings, referral has been approved and patient can admit to their facility on today. Per Alphonzo Lemmings, agency has their own local pharmacy so e-scripts can be sent as needed or the patient can arrive with 3-7 day supply of medications and scripts in order to be filled. Agency can provide transportation for the patient as well. Awaiting update regarding time. MD made aware and patient was informed. Patient reports he would like to move forward with this offer and reports he would like to call his family to see if someone can bring him some clothes prior to discharge. LCSW will continue to provide updates as received.    Fernande Boyden, LCSW Clinical Social Worker [facility] Ph: [phone]

## 2023-03-31 NOTE — ED Notes (Signed)
Pt is in his room resting in bed. Pt denies SI/HI/AVH. No acute distress noted. Will continue to monitor for safety.

## 2023-03-31 NOTE — ED Provider Notes (Signed)
FBC/OBS ASAP Discharge Summary  Date and Time: 03/31/2023 10:26 AM  Name: Frederick Ballard  MRN:  [id number]   Discharge Diagnoses:  Final diagnoses:  Cocaine use disorder (HCC)  Alcohol use disorder    Subjective:  Frederick Ballard is a 37 y.o. male patient with a reported psychiatric history significant for depression and substance abuse, (cocaine and alcohol use) who presents to the [facility] voluntary requesting detox.   Patient seen this morning. He notes feeling well, slightly nervous regarding the uncertainty of where he is going after [facility]BC. He is able to reframe his thoughts to have a more positive outlook. He denies issues with sleep and appetite. He denies withdrawal symptoms and adverse effects from his medications. He denies SI, HI, and AVH.   Patient was later informed that he has been accepted to [facility] in [place].  Stay Summary: The patient was evaluated each day by a clinical provider to ascertain response to treatment. Improvement was noted by the patient's report of decreasing symptoms, improved sleep and appetite, affect, medication tolerance, behavior, and participation in unit programming.  Patient was asked each day to complete a self inventory noting mood, mental status, pain, new symptoms, anxiety and concerns.  The patient's medications were managed with the following directions: Started naltrexone  Patient responded well to medication and being in a therapeutic and supportive environment. Positive and appropriate behavior was noted and the patient was motivated for recovery. The patient worked closely with the treatment team and case manager to develop a discharge plan with appropriate goals. Coping skills, problem solving as well as relaxation therapies were also part of the unit programming.   Total Time spent with patient: 30 minutes  Past Psychiatric History: MDD, AUD, stimulant use  Past Medical History: none Family History: father has a history  of cocaine, heroin, and alcohol abuse Social History: homeless for the past month due to being in and out of his sister's house using drugs. He denies legal issues. He has two children. Adult daughter and 49 year old son.  Tobacco Cessation:  A prescription for an FDA-approved tobacco cessation medication provided at discharge  Current Medications:  Current Facility-Administered Medications  Medication Dose Route Frequency Provider Last Rate Last Admin   acetaminophen (TYLENOL) tablet 650 mg  650 mg Oral Q6H PRN Kizzie Ide B, MD       multivitamin with minerals tablet 1 tablet  1 tablet Oral Daily Kizzie Ide B, MD   1 tablet at 03/31/23 0926   naltrexone (DEPADE) tablet 50 mg  50 mg Oral Daily Kizzie Ide B, MD   50 mg at 03/31/23 7829   nicotine (NICODERM CQ - dosed in mg/24 hours) patch 14 mg  14 mg Transdermal Daily Kizzie Ide B, MD   14 mg at 03/31/23 5621   thiamine (VITAMIN B1) tablet 100 mg  100 mg Oral Daily Kizzie Ide B, MD   100 mg at 03/31/23 0926   traZODone (DESYREL) tablet 50 mg  50 mg Oral QHS PRN Kizzie Ide B, MD   50 mg at 03/31/23 3086   Current Outpatient Medications  Medication Sig Dispense Refill   [START ON 04/01/2023] naltrexone (DEPADE) 50 MG tablet Take 1 tablet (50 mg total) by mouth daily. 7 tablet 0   [START ON 04/01/2023] nicotine (NICODERM CQ - DOSED IN MG/24 HOURS) 14 mg/24hr patch Place 1 patch (14 mg total) onto the skin daily. 7 patch 0   traZODone (DESYREL) 50 MG tablet Take 1  tablet (50 mg total) by mouth at bedtime as needed for up to 7 days for sleep. 7 tablet 0    PTA Medications:  Facility Ordered Medications  Medication   [COMPLETED] thiamine (VITAMIN B1) injection 100 mg   thiamine (VITAMIN B1) tablet 100 mg   multivitamin with minerals tablet 1 tablet   [EXPIRED] LORazepam (ATIVAN) tablet 1 mg   [EXPIRED] hydrOXYzine (ATARAX) tablet 25 mg   [EXPIRED] loperamide (IMODIUM) capsule 2-4 mg   [EXPIRED] ondansetron (ZOFRAN-ODT)  disintegrating tablet 4 mg   traZODone (DESYREL) tablet 50 mg   acetaminophen (TYLENOL) tablet 650 mg   nicotine (NICODERM CQ - dosed in mg/24 hours) patch 14 mg   naltrexone (DEPADE) tablet 50 mg   PTA Medications  Medication Sig   [START ON 04/01/2023] nicotine (NICODERM CQ - DOSED IN MG/24 HOURS) 14 mg/24hr patch Place 1 patch (14 mg total) onto the skin daily.   [START ON 04/01/2023] naltrexone (DEPADE) 50 MG tablet Take 1 tablet (50 mg total) by mouth daily.   traZODone (DESYREL) 50 MG tablet Take 1 tablet (50 mg total) by mouth at bedtime as needed for up to 7 days for sleep.       03/31/2023   10:18 AM 03/30/2023    9:45 AM 03/27/2023    3:23 PM  Depression screen PHQ 2/9  Decreased Interest 1 1 1   Down, Depressed, Hopeless 2 2 2   PHQ - 2 Score 3 3 3   Altered sleeping 2 2 2   Tired, decreased energy 1 1 1   Change in appetite 2 2 2   Feeling bad or failure about yourself  1 1 1   Trouble concentrating 1 1 1   Moving slowly or fidgety/restless 1 1 1   Suicidal thoughts 1  1  PHQ-9 Score 12 11 12     Flowsheet Row ED from 03/27/2023 in [facility] Most recent reading at 03/27/2023  2:45 PM ED from 03/27/2023 in [facility] Most recent reading at 03/27/2023  9:14 AM ED from 03/11/2023 in [facility] Emergency Department at [facility] Most recent reading at 03/11/2023  8:52 AM  C-SSRS RISK CATEGORY No Risk No Risk No Risk       Musculoskeletal  Strength & Muscle Tone: within normal limits Gait & Station: normal Patient leans: N/A  Psychiatric Specialty Exam  Presentation  General Appearance:  Appropriate for Environment  Eye Contact: Good  Speech: Clear and Coherent  Speech Volume: Normal  Handedness: Right   Mood and Affect  Mood: Euthymic  Affect: Appropriate; Congruent   Thought Process  Thought Processes: Linear; Coherent; Goal Directed  Descriptions of  Associations:Intact  Orientation:Full (Time, Place and Person)  Thought Content:Logical  Diagnosis of Schizophrenia or Schizoaffective disorder in past: No    Hallucinations:Hallucinations: None  Ideas of Reference:None  Suicidal Thoughts:Suicidal Thoughts: No  Homicidal Thoughts:Homicidal Thoughts: No   Sensorium  Memory: Recent Good; Immediate Good; Remote Good  Judgment: Good  Insight: Good   Executive Functions  Concentration: Good  Attention Span: Good  Recall: Good  Fund of Knowledge: Good  Language: Good   Psychomotor Activity  Psychomotor Activity: Psychomotor Activity: Normal   Assets  Assets: Communication Skills; Desire for Improvement; Resilience; Physical Health   Sleep  Sleep: Sleep: Good Number of Hours of Sleep: 5   Nutritional Assessment (For OBS and FBC admissions only) Has the patient had a weight loss or gain of 10 pounds or more in the last 3 months?: No Has  the patient had a decrease in food intake/or appetite?: No Does the patient have dental problems?: No Does the patient have eating habits or behaviors that may be indicators of an eating disorder including binging or inducing vomiting?: No Has the patient recently lost weight without trying?: 0 Has the patient been eating poorly because of a decreased appetite?: 0 Malnutrition Screening Tool Score: 0    Physical Exam  Physical Exam Vitals reviewed.  Constitutional:      Appearance: Normal appearance.  HENT:     Head: Normocephalic and atraumatic.  Cardiovascular:     Rate and Rhythm: Normal rate.  Pulmonary:     Effort: Pulmonary effort is normal.  Musculoskeletal:        General: Normal range of motion.  Neurological:     Mental Status: He is alert.    Review of Systems  Constitutional:  Negative for fever.  Cardiovascular:  Negative for chest pain.  Gastrointestinal:  Negative for nausea and vomiting.  Musculoskeletal:  Negative for myalgias.   Psychiatric/Behavioral:  Negative for depression.    Blood pressure (!) 106/59, pulse 70, temperature 97.9 F (36.6 C), temperature source Tympanic, resp. rate 20, SpO2 100%. There is no height or weight on file to calculate BMI.  Demographic Factors:  Male and Low socioeconomic status  Loss Factors: Financial problems/change in socioeconomic status  Historical Factors: Impulsivity  Risk Reduction Factors:   Positive social support, Positive therapeutic relationship, and Positive coping skills or problem solving skills  Continued Clinical Symptoms:  Alcohol/Substance Abuse/Dependencies  Cognitive Features That Contribute To Risk:  Closed-mindedness    Suicide Risk:  Mild:  Suicidal ideation of limited frequency, intensity, duration, and specificity.  There are no identifiable plans, no associated intent, mild dysphoria and related symptoms, good self-control (both objective and subjective assessment), few other risk factors, and identifiable protective factors, including available and accessible social support.  Plan Of Care/Follow-up recommendations:  Activity as tolerated Regular diet Continue prescription medications See PCP for medical conditions   Disposition: [facility]  Lance Muss, MD 03/31/2023, 10:26 AM

## 2023-03-31 NOTE — ED Notes (Signed)
Patient is sleeping. Respirations equal and unlabored, skin warm and dry. No change in assessment or acuity. Routine safety checks conducted according to facility protocol. Will continue to monitor for safety.   

## 2023-08-04 ENCOUNTER — Other Ambulatory Visit (HOSPITAL_COMMUNITY)
Admission: EM | Admit: 2023-08-04 | Discharge: 2023-08-07 | Disposition: A | Discharge status: Home / Self Care | Payer: 59 | Attending: Psychiatry | Admitting: Psychiatry

## 2023-08-04 ENCOUNTER — Ambulatory Visit (HOSPITAL_COMMUNITY)
Admission: EM | Admit: 2023-08-04 | Discharge: 2023-08-04 | Disposition: A | Discharge status: Other Acute Inpatient Hospital | Payer: 59 | Attending: Psychiatry | Admitting: Psychiatry

## 2023-08-04 DIAGNOSIS — F259 Schizoaffective disorder, unspecified: Secondary | ICD-10-CM | POA: Diagnosis not present

## 2023-08-04 DIAGNOSIS — I498 Other specified cardiac arrhythmias: Secondary | ICD-10-CM | POA: Insufficient documentation

## 2023-08-04 DIAGNOSIS — F1414 Cocaine abuse with cocaine-induced mood disorder: Secondary | ICD-10-CM | POA: Diagnosis not present

## 2023-08-04 DIAGNOSIS — F1910 Other psychoactive substance abuse, uncomplicated: Secondary | ICD-10-CM

## 2023-08-04 DIAGNOSIS — R001 Bradycardia, unspecified: Secondary | ICD-10-CM | POA: Diagnosis not present

## 2023-08-04 DIAGNOSIS — F1914 Other psychoactive substance abuse with psychoactive substance-induced mood disorder: Secondary | ICD-10-CM

## 2023-08-04 DIAGNOSIS — F1010 Alcohol abuse, uncomplicated: Secondary | ICD-10-CM | POA: Diagnosis present

## 2023-08-04 DIAGNOSIS — F1410 Cocaine abuse, uncomplicated: Secondary | ICD-10-CM | POA: Diagnosis not present

## 2023-08-04 DIAGNOSIS — F1994 Other psychoactive substance use, unspecified with psychoactive substance-induced mood disorder: Secondary | ICD-10-CM

## 2023-08-04 DIAGNOSIS — F1020 Alcohol dependence, uncomplicated: Secondary | ICD-10-CM

## 2023-08-04 LAB — CBC WITH DIFFERENTIAL/PLATELET
Abs Immature Granulocytes: 0.01 10*3/uL (ref 0.00–0.07)
Basophils Absolute: 0.1 10*3/uL (ref 0.0–0.1)
Basophils Relative: 1 %
Eosinophils Absolute: 0.2 10*3/uL (ref 0.0–0.5)
Eosinophils Relative: 3 %
HCT: 44.2 % (ref 39.0–52.0)
Hemoglobin: 14.6 g/dL (ref 13.0–17.0)
Immature Granulocytes: 0 %
Lymphocytes Relative: 45 %
Lymphs Abs: 3.7 10*3/uL (ref 0.7–4.0)
MCH: 30 pg (ref 26.0–34.0)
MCHC: 33 g/dL (ref 30.0–36.0)
MCV: 90.9 fL (ref 80.0–100.0)
Monocytes Absolute: 0.8 10*3/uL (ref 0.1–1.0)
Monocytes Relative: 10 %
Neutro Abs: 3.3 10*3/uL (ref 1.7–7.7)
Neutrophils Relative %: 41 %
Platelets: 297 10*3/uL (ref 150–400)
RBC: 4.86 MIL/uL (ref 4.22–5.81)
RDW: 13.2 % (ref 11.5–15.5)
WBC: 8.1 10*3/uL (ref 4.0–10.5)
nRBC: 0 % (ref 0.0–0.2)

## 2023-08-04 LAB — COMPREHENSIVE METABOLIC PANEL
ALT: 42 U/L (ref 0–44)
AST: 41 U/L (ref 15–41)
Albumin: 4.1 g/dL (ref 3.5–5.0)
Alkaline Phosphatase: 100 U/L (ref 38–126)
Anion gap: 8 (ref 5–15)
BUN: 14 mg/dL (ref 6–20)
CO2: 26 mmol/L (ref 22–32)
Calcium: 9.7 mg/dL (ref 8.9–10.3)
Chloride: 106 mmol/L (ref 98–111)
Creatinine, Ser: 0.97 mg/dL (ref 0.61–1.24)
GFR, Estimated: >60 mL/min (ref >60)
Glucose, Bld: 87 mg/dL (ref 70–99)
Potassium: 4.6 mmol/L (ref 3.5–5.1)
Sodium: 140 mmol/L (ref 135–145)
Total Bilirubin: 0.4 mg/dL (ref 0.0–1.2)
Total Protein: 6.7 g/dL (ref 6.5–8.1)

## 2023-08-04 LAB — POCT URINE DRUG SCREEN - MANUAL ENTRY (I-SCREEN)
POC Amphetamine UR: NOT DETECTED
POC Buprenorphine (BUP): NOT DETECTED
POC Cocaine UR: POSITIVE — AB
POC Marijuana UR: NOT DETECTED
POC Methadone UR: NOT DETECTED
POC Methamphetamine UR: NOT DETECTED
POC Morphine: NOT DETECTED
POC Oxazepam (BZO): NOT DETECTED
POC Oxycodone UR: NOT DETECTED
POC Secobarbital (BAR): NOT DETECTED

## 2023-08-04 LAB — MAGNESIUM: Magnesium: 2.3 mg/dL (ref 1.7–2.4)

## 2023-08-04 LAB — ETHANOL: Alcohol, Ethyl (B): <10 mg/dL (ref <10)

## 2023-08-04 LAB — VITAMIN B12: Vitamin B-12: 361 pg/mL (ref 180–914)

## 2023-08-04 MED ORDER — HYDROXYZINE HCL 25 MG PO TABS: 25.0000 mg | ORAL_TABLET | Freq: Four times a day (QID) | ORAL | Status: DC | PRN

## 2023-08-04 MED ORDER — ONDANSETRON 4 MG PO TBDP: 4.0000 mg | ORAL_TABLET | Freq: Four times a day (QID) | ORAL | Status: DC | PRN

## 2023-08-04 MED ORDER — MAGNESIUM HYDROXIDE 400 MG/5ML PO SUSP: 30.0000 mL | Freq: Every day | ORAL | Status: DC | PRN

## 2023-08-04 MED ORDER — ACETAMINOPHEN 325 MG PO TABS: 650.0000 mg | ORAL_TABLET | Freq: Four times a day (QID) | ORAL | Status: DC | PRN

## 2023-08-04 MED ORDER — THIAMINE MONONITRATE 100 MG PO TABS: 100.0000 mg | ORAL_TABLET | Freq: Every day | ORAL | Status: DC

## 2023-08-04 MED ORDER — TRAZODONE HCL 50 MG PO TABS: 50.0000 mg | ORAL_TABLET | Freq: Every evening | ORAL | Status: DC | PRN

## 2023-08-04 MED ORDER — ALUM & MAG HYDROXIDE-SIMETH 200-200-20 MG/5ML PO SUSP: 30.0000 mL | ORAL | Status: DC | PRN

## 2023-08-04 MED ORDER — LORAZEPAM 1 MG PO TABS
1.0000 mg | ORAL_TABLET | Freq: Four times a day (QID) | ORAL | Status: DC | PRN
  Administered 2023-08-06: 1 mg via ORAL
  Filled 2023-08-04: qty 1

## 2023-08-04 MED ORDER — THIAMINE HCL 100 MG/ML IJ SOLN: 100.0000 mg | Freq: Once | INTRAMUSCULAR | Status: DC

## 2023-08-04 MED ORDER — ADULT MULTIVITAMIN W/MINERALS CH
1.0000 | ORAL_TABLET | Freq: Every day | ORAL | Status: DC
  Administered 2023-08-05 – 2023-08-06 (×2): 1 via ORAL
  Filled 2023-08-04 (×3): qty 1

## 2023-08-04 MED ORDER — THIAMINE MONONITRATE 100 MG PO TABS
100.0000 mg | ORAL_TABLET | Freq: Every day | ORAL | Status: DC
  Administered 2023-08-05 – 2023-08-06 (×2): 100 mg via ORAL
  Filled 2023-08-04 (×3): qty 1

## 2023-08-04 MED ORDER — TRAZODONE HCL 50 MG PO TABS
50.0000 mg | ORAL_TABLET | Freq: Every evening | ORAL | Status: DC | PRN
  Administered 2023-08-05 – 2023-08-06 (×2): 50 mg via ORAL
  Filled 2023-08-04 (×2): qty 1

## 2023-08-04 MED ORDER — LOPERAMIDE HCL 2 MG PO CAPS: 2.0000 mg | ORAL_CAPSULE | ORAL | Status: DC | PRN

## 2023-08-04 MED ORDER — ADULT MULTIVITAMIN W/MINERALS CH: 1.0000 | ORAL_TABLET | Freq: Every day | ORAL | Status: DC

## 2023-08-04 MED ORDER — LORAZEPAM 1 MG PO TABS: 1.0000 mg | ORAL_TABLET | Freq: Four times a day (QID) | ORAL | Status: DC | PRN

## 2023-08-04 NOTE — ED Provider Notes (Signed)
[facility] Medical Screening Exam  Patient Name: Frederick Ballard MRN: [id number] Date of Evaluation: 08/04/23 Chief Complaint:  "I need detox" Diagnosis:  Final diagnoses:  Polysubstance abuse (HCC)  Alcohol abuse  Cocaine abuse (HCC)  Substance induced mood disorder (HCC)   History of Present illness: Frederick Ballard is a 38 y.o. male patient presented to [facility] as a walk in unaccompanied with complaints of "I need detox"  Frederick Ballard, 38 y.o., male patient seen face to face by this provider, consulted with Dr. Woodroe Mode; and chart reviewed on 08/04/23.  Per chart review patient has a past psychiatric history that includes schizoaffective disorder, substance-induced mood disorder, polysubstance abuse (alcohol and cocaine). Past medication trials include Abilify, sertraline, naltrexone, Invega and trazodone. He denies any previous inpatient psychiatric admissions.  He denies any suicide attempts.  Patient was admitted to the facility based crisis unit from 03/27/2023-03/31/2023.  He was discharged at that time to [facility] recovery residential substance abuse treatment in [facility].  He currently does not take any medications.  He has no outpatient psychiatric resources in place.  He lives in a residence with a roommate.  He is single and has 2 children ages 37 and 66.  He is currently unemployed but is looking forward to obtaining a job in the [employer].  He has a upcoming court date on 08/05/2023 related to past due to child support.  On evaluation Frederick Ballard reports he completed the residential program at [facility] in [place].  This was a 5-week program.  Once he was discharged around October/November of 2024 he immediately relapsed.  He has been using alcohol and cocaine daily since that time.  In addition he stopped using all medications at that time.  He has been snorting cocaine a half a gram at a time.  He has been drinking at the minimum  44 ounces of malt liquor daily.  At times he will often have several shots of liquor if they are available.  His last use of both substances was last night.  He denies any history of alcohol withdrawal seizure or delirium tremor.  He is denying any alcohol withdrawal symptoms at this time.  He presents to [facility] to today requesting detox.  During evaluation Frederick Ballard is observed laying in the assessment room floor asleep.  He is easily awakened.  He is alert/oriented x 4, calm, and cooperative.  He appears to be in no acute distress.  He has normal speech and behavior.  He endorses depression related to the effects of his substance use.  He is endorsing decreased focus, helplessness, irritability, decreased appetite and sleep.  He reports he has not slept in 3 days due to cocaine use.  He was able to sleep 5-6 hours last night.  He also has no appetite when he is using cocaine and states today he has felt his appetite is returning.  He has a euthymic affect.  He denies suicidal ideations or any past suicide at [employer].  He verbally contracts for safety he has no access for firearms/weapons.  He denies homicidal ideations.  He denies auditory/visual hallucinations.  He does not appear manic, psychotic, or paranoid.  He does not appear to be responding to internal/external stimuli  Discussed admission to the facility based crisis unit and patient is in agreement.  He is not interested in residential substance abuse treatment at this time.  He is interested in outpatient services because he  needs to find a job as that he is behind in child support.  He verbalized and is in agreement.   Flowsheet Row ED from 08/04/2023 in [facility] Most recent reading at 08/04/2023  1:09 PM ED from 03/27/2023 in [facility] Most recent reading at 03/27/2023  2:45 PM ED from 03/27/2023 in [facility] Most recent reading at 03/27/2023  9:14 AM   C-SSRS RISK CATEGORY No Risk No Risk No Risk       Psychiatric Specialty Exam  Presentation  General Appearance:Casual  Eye Contact:Good  Speech:Clear and Coherent; Normal Rate  Speech Volume:Normal  Handedness:Right   Mood and Affect  Mood: Anxious; Depressed  Affect: Congruent   Thought Process  Thought Processes: Coherent  Descriptions of Associations:Intact  Orientation:Full (Time, Place and Person)  Thought Content:Logical  Diagnosis of Schizophrenia or Schizoaffective disorder in past: No   Hallucinations:None  Ideas of Reference:None  Suicidal Thoughts:No  Homicidal Thoughts:No   Sensorium  Memory: Immediate Good; Recent Good; Remote Good  Judgment: Fair  Insight: Good   Executive Functions  Concentration: Good  Attention Span: Good  Recall: Good  Fund of Knowledge: Good  Language: Good   Psychomotor Activity  Psychomotor Activity: Normal   Assets  Assets: Communication Skills; Desire for Improvement; Leisure Time; Physical Health; Resilience; Housing   Sleep  Sleep: Poor  Number of hours:  5 (5 hours last night but previously has not slept in 3 days)   Physical Exam: Physical Exam Vitals and nursing note reviewed.  Constitutional:      Appearance: Normal appearance.  Eyes:     General:        Right eye: No discharge.        Left eye: No discharge.  Cardiovascular:     Rate and Rhythm: Normal rate.  Pulmonary:     Effort: No respiratory distress.  Musculoskeletal:        General: Normal range of motion.     Cervical back: Normal range of motion.  Skin:    Coloration: Skin is not jaundiced or pale.  Neurological:     Mental Status: He is alert and oriented to person, place, and time.  Psychiatric:        Attention and Perception: Attention and perception normal.        Mood and Affect: Mood is anxious and depressed.        Speech: Speech normal.        Behavior: Behavior normal. Behavior is  cooperative.        Thought Content: Thought content normal.        Cognition and Memory: Cognition normal.        Judgment: Judgment normal.    Review of Systems  Constitutional:  Negative for chills and fever.  HENT:  Negative for hearing loss.   Respiratory:  Negative for cough and sputum production.   Cardiovascular:  Negative for chest pain.  Musculoskeletal: Negative.   Neurological:  Negative for tremors and seizures.  Psychiatric/Behavioral:  Positive for depression and substance abuse. The patient is nervous/anxious.    Blood pressure 123/75, pulse 60, temperature 97.8 F (36.6 C), temperature source Oral, resp. rate 16, SpO2 100%. There is no height or weight on file to calculate BMI.  Musculoskeletal: Strength & Muscle Tone: within normal limits Gait & Station: normal Patient leans: N/A   [facility] MSE Discharge Disposition for Follow up and Recommendations: Patient does not have an emergent  medical condition.  Patient recommended for treatment in the facility based crisis unit.  Medications:  CIWA protocol with Ativan 1 mg every 6 hours as needed for CIWA greater than 10  Lab Orders         CBC with Differential/Platelet         Comprehensive metabolic panel         Magnesium         Ethanol         Vitamin B12         POCT Urine Drug Screen - (I-Screen)      EKG   Ardis Hughs, NP 08/04/2023, 3:25 PM

## 2023-08-04 NOTE — Progress Notes (Signed)
Patient considered to be a high fall risk due to detox protocol and a yellow armband was placed on his wrist to assist in identification of this risk.

## 2023-08-04 NOTE — BH Assessment (Signed)
Comprehensive Clinical Assessment (CCA) Note  08/04/2023 Frederick Ballard [id number]  DISPOSITION: Per Vernard Gambles NP pt is recommended for detox at [facility]  The patient demonstrates the following risk factors for suicide: Chronic risk factors for suicide include: psychiatric disorder of schizoaffective d/o and substance use disorder. Acute risk factors for suicide include: unemployment and social withdrawal/isolation. Protective factors for this patient include: responsibility to others (children, family) and hope for the future. Considering these factors, the overall suicide risk at this point appears to be low. Patient is appropriate for outpatient follow up.    Per Triage assessment: "Frederick Ballard presents to [facility] voluntarily unaccompanied. Pt states that he has a problem with alcohol and drugs. Pt states that he has a court date on tomorrow, but he is not here to get away from that. Pt states that he has given up on having a cellphone because of drugs. Pt currently denies SI, HI, and AVH. Pt states that he used crack cocaine -about a half gram & drank beer - 44oz within the past 24 hours. Pt states that he isn't sure if he need detox or just outpatient services at this time."  With further assessment: Pt is a 38 yo male who presented voluntarily and unaccompanied due to substance use/abuse. Pt denied SI, HI, self-harm, AVH and paranoia. Pt reported daily use of cocaine and alcohol with last use for both as last night. Pt reported he has been on a 3 day binge with cocaine and has not slept in 3 days. Pt stated that his appetite fluctuates with his cocaine use. Pt reported that he was last admitted to detox in September and then went into [facility] for about 5 weeks. Pt stated that he relapsed as soon as he left [facility] and has been using continuously since that time. Pt denied any OP psychiatric providers despite a hx of schizoaffective d/o. Pt stated that he was prescribed psychiatric  medications but stopped using them "months ago" due to the side effects of feeling overly tired continually. Pt denied any psychiatric IP admissions. Pt denied symptoms of depression stating that he felt tired of the cycle of drug use and relapse but did not feel hopeless in general.   Pt stated that he is currently living with a friend who is also a substance user/abuser. Pt stated he is unemployed and does not receive disability income. Pt stated that he currently has pending charges with the courts for unpaid child support and stated he has a court date tomorrow. Pt denied access to firearms and any hx of childhood abuse. Pt state he is not married and has 2 children ages 51 and 86 yo.   Pt was calm, cooperative, sleepy and seemed fully oriented. Pt's speech and movement was within normal limits. Pt's insight and judgment was fair. Pt's mood seemed somber but not depressed. His affect was congruent. Pt was casually dressed and seemed adequately groomed although somewhat disheveled.    Chief Complaint:  Chief Complaint  Patient presents with   Alcohol Problem   Addiction Problem   Visit Diagnosis:  Alcohol Use d/o, Severe Stimulant Use d/o, Cocaine, Severe Schizoaffective d/o    CCA Screening, Triage and Referral (STR)  Patient Reported Information How did you hear about [place]? Self  What Is the Reason for Your Visit/Call Today? Frederick Ballard presents to [facility] voluntarily unaccompanied. Pt states that he has a problem with alcohol and drugs. Pt states that he has a court date on tomorrow, but  he is not here to get away from that. Pt states that he has given up on having a cellphone because of drugs. Pt currently denies SI, HI, and AVH. Pt states that he used crack cocaine -about a half gram & drank beer - 44oz within the past 24 hours. Pt states that he isn't sure if he need detox or just outpatient services at this time.  How Long Has This Been Causing You Problems? > than 6 months  What Do  You Feel Would Help You the Most Today? Social Support; Alcohol or Drug Use Treatment   Have You Recently Had Any Thoughts About Hurting Yourself? No  Are You Planning to Commit Suicide/Harm Yourself At This time? No   Flowsheet Row ED from 08/04/2023 in [facility] Most recent reading at 08/04/2023  1:09 PM ED from 03/27/2023 in [facility] Most recent reading at 03/27/2023  2:45 PM ED from 03/27/2023 in [facility] Most recent reading at 03/27/2023  9:14 AM  C-SSRS RISK CATEGORY No Risk No Risk No Risk       Have you Recently Had Thoughts About Hurting Someone [name]? Yes  Are You Planning to Harm Someone at This Time? No  Explanation: NA   Have You Used Any Alcohol or Drugs in the Past 24 Hours? Yes  How Long Ago Did You Use Drugs or Alcohol? Last night What Did You Use and How Much? crack cocaine -about a half gram &  beer - 44oz   Do You Currently Have a Therapist/Psychiatrist? No  Name of Therapist/Psychiatrist:    Have You Been Recently Discharged From Any Office Practice or Programs? No  Explanation of Discharge From Practice/Program: NA     CCA Screening Triage Referral Assessment Type of Contact: Face-to-Face  Telemedicine Service Delivery:   Is this Initial or Reassessment?   Date Telepsych consult ordered in [facility]:    Time Telepsych consult ordered in [facility]:    Location of Assessment: [facility] Assessment Services  Provider Location: [facility] Assessment Services   Collateral Involvement: None at this time   Does Patient Have a Automotive engineer Guardian? No  Legal Guardian Contact Information: na  Copy of Legal Guardianship Form: No - copy requested  Legal Guardian Notified of Arrival: -- (na)  Legal Guardian Notified of Pending Discharge: -- (na)  If Minor and Not Living with Parent(s), Who has Custody? adult  Is CPS involved or ever been involved? -- (None  reported. Pt stated that he was being charged for not paying child support.)  Is APS involved or ever been involved? -- (none reported)   Patient Determined To Be At Risk for Harm To Self or Others Based on Review of Patient Reported Information or Presenting Complaint? No  Method: No Plan  Availability of Means: No access or NA  Intent: Vague intent or NA  Notification Required: No need or identified person  Additional Information for Danger to Others Potential: -- (na)  Additional Comments for Danger to Others Potential: None noted  Are There Guns or Other Weapons in Your Home? No  Types of Guns/Weapons: Denies  Are These Weapons Safely Secured?                            -- (NA)  Who Could Verify You Are Able To Have These Secured: na  Do You Have any Outstanding Charges, Pending Court Dates,  Parole/Probation? Pt stated that he was being charged for not paying child support. Court date is tomorrow 08/05/23.  Contacted To Inform of Risk of Harm To Self or Others: -- (na)    Does Patient Present under Involuntary Commitment? No    [place] of Residence: [place]   Patient Currently Receiving the Following Services: Not Receiving Services   Determination of Need: Urgent (48 hours) (Per Vernard Gambles NP pt is recommended for detox at [facility])   Options For Referral: Facility-Based Crisis     CCA Biopsychosocial Patient Reported Schizophrenia/Schizoaffective Diagnosis in Past: No   Strengths: Patient is willing to participate in treatment   Mental Health Symptoms Depression:  Difficulty Concentrating; Fatigue; Hopelessness   Duration of Depressive symptoms: Duration of Depressive Symptoms: Greater than two weeks   Mania:  None   Anxiety:   Difficulty concentrating   Psychosis:  None   Duration of Psychotic symptoms:    Trauma:  None   Obsessions:  None   Compulsions:  None   Inattention:  None   Hyperactivity/Impulsivity:  None    Oppositional/Defiant Behaviors:  None   Emotional Irregularity:  None   Other Mood/Personality Symptoms:  None noted    Mental Status Exam Appearance and self-care  Stature:  Average   Weight:  Average weight   Clothing:  Casual   Grooming:  Normal   Cosmetic use:  None   Posture/gait:  Normal   Motor activity:  Not Remarkable   Sensorium  Attention:  Normal   Concentration:  Normal   Orientation:  X5   Recall/memory:  Normal   Affect and Mood  Affect:  Flat   Mood:  Negative   Relating  Eye contact:  Normal   Facial expression:  Constricted   Attitude toward examiner:  Cooperative   Thought and Language  Speech flow: Clear and Coherent; Paucity; Slow   Thought content:  Appropriate to Mood and Circumstances   Preoccupation:  None   Hallucinations:  None   Organization:  Intact   [employer] of Knowledge:  Fair   Intelligence:  Average   Abstraction:  Normal   Judgement:  Fair   Dance movement psychotherapist:  Adequate   Insight:  Fair   Decision Making:  Vacilates   Social Functioning  Social Maturity:  Impulsive   Social Judgement:  Heedless   Stress  Stressors:  Transitions; Family conflict; Housing; Surveyor, quantity; Other (Comment) (Ongoing SA use)   Coping Ability:  Overwhelmed; Exhausted   Skill Deficits:  Self-control; Self-care; Responsibility   Supports:  Support needed; Friends/Service system     Religion: Religion/Spirituality Are You A Religious Person?: No How Might This Affect Treatment?: NA  Leisure/Recreation: Leisure / Recreation Do You Have Hobbies?: No  Exercise/Diet: Exercise/Diet Do You Exercise?: No Have You Gained or Lost A Significant Amount of Weight in the Past Six Months?: No Do You Follow a Special Diet?: No Do You Have Any Trouble Sleeping?: No   CCA Employment/Education Employment/Work Situation: Employment / Work Situation Employment Situation: Unemployed Patient's Job has Been  Impacted by Current Illness: Yes Describe how Patient's Job has Been Impacted: lost jobs as a result Has Patient ever Been in Equities trader?: No  Education: Education Is Patient Currently Attending School?: No Last Grade Completed: 12 Did You Product manager?: No Did You Have An Individualized Education Program (IIEP): No Did You Have Any Difficulty At School?: No   CCA Family/Childhood History Family and Relationship History: Family history Marital status: Single  Does patient have children?: Yes How many children?: 2 How is patient's relationship with their children?: Pt reports a good relationship with children although reports he doesn't get to see them that much  Childhood History:  Childhood History By whom was/is the patient raised?: Mother, Grandparents Did patient suffer any verbal/emotional/physical/sexual abuse as a child?: No Has patient ever been sexually abused/assaulted/raped as an adolescent or adult?: No Witnessed domestic violence?: No Has patient been affected by domestic violence as an adult?: No       CCA Substance Use Alcohol/Drug Use: Alcohol / Drug Use Pain Medications: See MAR Prescriptions: See MAR Over the Counter: See MAR History of alcohol / drug use?: Yes (ocassional on weekends) Longest period of sobriety (when/how long): unknown Negative Consequences of Use: Personal relationships, Financial, Work / Programmer, multimedia Withdrawal Symptoms: None (none reported) Substance #1 Name of Substance 1: cocaine 1 - Age of First Use: unknown 1 - Amount (size/oz): 1/2 gm 1 - Frequency: daily 1 - Duration: ongoing 1 - Last Use / Amount: last night 1 - Method of Aquiring: unknown 1- Route of Use: unknown Substance #2 Name of Substance 2: alcohol 2 - Age of First Use: unknown 2 - Amount (size/oz): Pt states usually "a couple 40 oz beers" 2 - Frequency: daily if possible per pt 2 - Duration: ongoing 2 - Last Use / Amount: last night, unknown amount 2 -  Method of Aquiring: unknown 2 - Route of Substance Use: drink, oral                     ASAM's:  Six Dimensions of Multidimensional Assessment  Dimension 1:  Acute Intoxication and/or Withdrawal Potential:   Dimension 1:  Description of individual's past and current experiences of substance use and withdrawal: No signs of withdrawal present  Dimension 2:  Biomedical Conditions and Complications:   Dimension 2:  Description of patient's biomedical conditions and  complications: none reported  Dimension 3:  Emotional, Behavioral, or Cognitive Conditions and Complications:  Dimension 3:  Description of emotional, behavioral, or cognitive conditions and complications: Hx of schizoaffective d/o and not taking prescribed medications  Dimension 4:  Readiness to Change:  Dimension 4:  Description of Readiness to Change criteria: Willing to participate in treatment  Dimension 5:  Relapse, Continued use, or Continued Problem Potential:  Dimension 5:  Relapse, continued use, or continued problem potential critiera description: multiple relapses  Dimension 6:  Recovery/Living Environment:  Dimension 6:  Recovery/Iiving environment criteria description: does not have a supportive recovery environment  ASAM Severity Score: ASAM's Severity Rating Score: 9  ASAM Recommended Level of Treatment: ASAM Recommended Level of Treatment: Level III Residential Treatment   Substance use Disorder (SUD) Substance Use Disorder (SUD)  Checklist Symptoms of Substance Use: Continued use despite having a persistent/recurrent physical/psychological problem caused/exacerbated by use, Continued use despite persistent or recurrent social, interpersonal problems, caused or exacerbated by use, Recurrent use that results in a failure to fulfill major role obligations (work, school, home)  Recommendations for Services/Supports/Treatments: Recommendations for Services/Supports/Treatments Recommendations For  Services/Supports/Treatments: Facility Based Crisis  Disposition Recommendation per psychiatric provider: We recommend transfer to [facility] for detox.    DSM5 Diagnoses: Patient Active Problem List   Diagnosis Date Noted   Substance induced mood disorder (HCC) 01/16/2021   Alcohol abuse 01/16/2021   Cocaine use disorder, moderate, in controlled environment (HCC) 01/16/2021   Polysubstance abuse (HCC) 07/24/2011   Schizoaffective disorder, depressive type (HCC) 07/24/2011  Referrals to Alternative Service(s): Referred to Alternative Service(s):   Place:   Date:   Time:    Referred to Alternative Service(s):   Place:   Date:   Time:    Referred to Alternative Service(s):   Place:   Date:   Time:    Referred to Alternative Service(s):   Place:   Date:   Time:     Flora Parks T, Counselor

## 2023-08-04 NOTE — ED Notes (Signed)
Pt to be transferred to [facility]. Report given to [place], RN of pt's acceptance for ETOH/cocaine detox. Denies SI/HI/AVH. Safety maintained.

## 2023-08-04 NOTE — Progress Notes (Signed)
Frederick Ballard has a court date tomorrow and would like the number or for someone to contact the courts so he does not get a failure to appear.  He reports this court date to be in regards to child support and not a criminal case.

## 2023-08-04 NOTE — ED Notes (Signed)
Patient admitted to [facility] unit for request for substance abuse disorder.  He is alert and oriented to self, place, time and situation.  Patient is calm, cooperative with care and the admission process.  He was oriented to the unit and his room with unit rules explained.  He verbalized understanding of information provided.  He voiced no comments, concerns, no complaints.  Patient reports that he was here previously on this unit but could not remember exactly when.  His main concerns at time of admission were being "hungry" and "ready to sleep."

## 2023-08-04 NOTE — ED Notes (Signed)
Patient is sleeping. Respirations equal and unlabored, skin warm and dry. No change in assessment or acuity. Routine safety checks conducted according to facility protocol. Will continue to monitor for safety.   

## 2023-08-04 NOTE — ED Notes (Signed)
Pt was provided dinner.

## 2023-08-04 NOTE — Progress Notes (Signed)
   08/04/23 1201  [facility] Triage Screening (Walk-ins at [facility] only)  How Did You Hear About [place]? Self  What Is the Reason for Your Visit/Call Today? Frederick Ballard presents to [facility] voluntarily unaccompanied. Pt states that he has a problem with alcohol and drugs. Pt states that he has a court date on tomorrow, but he is not here to get away from that. Pt states that he has given up on having a cellphone because of drugs. Pt currently denies SI, HI, and AVH. Pt states that he used crack cocaine -about a half gram & drank beer - 44oz within the past 24 hours. Pt states that he isn't sure if he need detox or just outpatient services at this time.  How Long Has This Been Causing You Problems? > than 6 months  Have You Recently Had Any Thoughts About Hurting Yourself? No  Are You Planning to Commit Suicide/Harm Yourself At This time? No  Have you Recently Had Thoughts About Hurting Someone [name]? Yes  How long ago did you have thoughts of harming others? yesterday  Are You Planning To Harm Someone At This Time? No  Physical Abuse Denies  Verbal Abuse Yes, past (Comment)  Sexual Abuse Denies  Exploitation of patient/patient's resources Yes, past (Comment)  Self-Neglect Denies  Are you currently experiencing any auditory, visual or other hallucinations? No  Have You Used Any Alcohol or Drugs in the Past 24 Hours? Yes  What Did You Use and How Much? crack cocaine -about a half gram &  beer - 44oz  Do you have any current medical co-morbidities that require immediate attention? No  Clinician description of patient physical appearance/behavior: calm, cooperative  What Do You Feel Would Help You the Most Today? Social Support;Alcohol or Drug Use Treatment  If access to [facility] was not available, would you have sought care in the Emergency Department? No  Determination of Need Routine (7 days)  Options For Referral Facility-Based Crisis;Chemical Dependency Intensive Outpatient Therapy (CDIOP);Outpatient  Therapy;Medication Management

## 2023-08-04 NOTE — Discharge Instructions (Addendum)
Discharge and readmit to [facility]

## 2023-08-04 NOTE — Group Note (Signed)
Group Topic: Positive Affirmations  Group Date: 08/04/2023 Start Time: 2130 End Time: 2200 Facilitators: Guss Bunde  Department: [facility]  Number of Participants: 5  Group Focus: check in Treatment Modality:  Leisure Development Interventions utilized were leisure development Purpose: reinforce self-care  Name: Frederick Ballard Date of Birth: 1986/01/18  MR: [id number]    Level of Participation: Did not attend Quality of Participation:  Interactions with others:  Mood/Affect:  Triggers (if applicable):  Cognition:  Progress:  Response:  Plan:   Patients Problems:  Patient Active Problem List   Diagnosis Date Noted   Substance induced mood disorder (HCC) 01/16/2021   Alcohol abuse 01/16/2021   Cocaine use disorder, moderate, in controlled environment (HCC) 01/16/2021   Polysubstance abuse (HCC) 07/24/2011   Schizoaffective disorder, depressive type (HCC) 07/24/2011

## 2023-08-05 DIAGNOSIS — F1910 Other psychoactive substance abuse, uncomplicated: Secondary | ICD-10-CM | POA: Diagnosis not present

## 2023-08-05 MED ORDER — POLYETHYLENE GLYCOL 3350 17 G PO PACK: 17.0000 g | PACK | Freq: Every day | ORAL | Status: DC | PRN

## 2023-08-05 NOTE — ED Notes (Signed)
Snacks and fluids offered and given to patient.

## 2023-08-05 NOTE — Tx Team (Signed)
LCSW met with patient at bedside to assess current mood, affect, physical state, and inquire about needs/goals while here in [facility] and after discharge. Patient reports he presented due to needing to seek living arrangements for himself.  Patient reports he was just discharged from [facility] in [place], [place] back in November, and reports he has been staying off and on with friends since then.  Patient reports he completed the 5-week program, however reports his insurance stopped so he had to be removed from the program.  Patient reports he returned back to use in November and has since then been using daily.  Patient reports he uses 1/2 g of cocaine daily.  Patient reports he has been using since the age of 84.  Patient reports his last use was prior to admission.  Patient also reports he drinks a 40 ounce beer and a shot of hard liquor daily, and reports he has been doing this since his younger years.  Patient reports an 1-month period of sobriety about 3 to 4 years ago.  Patient reports his motivating factor at this time is his children.  Patient reported he had a court date on today regarding child support, however reports his admit was not an attempt to avoid said court date.  Patient denies any other legal charges upcoming court dates.  Patient reports he is pending a job at [employer] working for [employer].  Patient reports he just passed the background check and is just waiting on a start date.  Patient reports his current goal is to find an [place] house within the area that he can reside in as he continues to work.  Patient reports he is not interested in residential placement at this time.  Patient denied collateral stating he does not have any support.  Patient aware that LCSW will provide a list of [place] houses within [facility] that he could follow up with regarding bed availability.  Patient reported appreciation for LCSW assistance.  No other needs were reported at this time.  List  provided and patient was encouraged to make contact with [facility].   Fernande Boyden, LCSW Clinical Social Worker [facility] Ph: [phone]

## 2023-08-05 NOTE — Group Note (Signed)
Group Topic: Communication  Group Date: 08/05/2023 Start Time: 1207 End Time: 1222 Facilitators: Vonzell Schlatter B  Department: [facility]  Number of Participants: 4  Group Focus: communication and daily focus Treatment Modality:  Psychoeducation Interventions utilized were patient education and problem solving Purpose: express feelings and regain self-worth  Name: Frederick Ballard Date of Birth: [birth year]  MR: [id number]    Level of Participation: active Quality of Participation: attentive and cooperative Interactions with others: gave feedback Mood/Affect: positive Triggers (if applicable): N/A Cognition: coherent/clear Progress: Moderate Response: Pt said he was happy that he stopped drinking and smoking for a while but he's still needing help not returning back to his addiction Plan: follow-up needed  Patients Problems:  Patient Active Problem List   Diagnosis Date Noted   Substance induced mood disorder (HCC) 01/16/2021   Alcohol abuse 01/16/2021   Cocaine use disorder, moderate, in controlled environment (HCC) 01/16/2021   Polysubstance abuse (HCC) 07/24/2011   Schizoaffective disorder, depressive type (HCC) 07/24/2011

## 2023-08-05 NOTE — Progress Notes (Signed)
Pt is awake, alert and oriented X4. Pt did voice any complaints of pain or discomfort. No signs of acute distress noted. Administered scheduled meds per order. Pt denies current SI/HI/AVH, plan or intent. Staff will monitor for pt's safety.

## 2023-08-05 NOTE — Progress Notes (Signed)
Pt stayed in his room most of the shift however participated in groups. No distress noted or concerns voiced. Staff will monitor for pt's safety.

## 2023-08-05 NOTE — ED Notes (Signed)
 Pt is in the dayroom watching TV with peers. Pt denies SI/HI/AVH. Pt has no further complain.No acute distress noted. Will continue to monitor for safety and provide support.

## 2023-08-05 NOTE — ED Notes (Signed)
Patient is sleeping. Respirations equal and unlabored, skin warm and dry. No change in assessment or acuity. Routine safety checks conducted according to facility protocol. Will continue to monitor for safety.   

## 2023-08-05 NOTE — Group Note (Signed)
Group Topic: Understanding Self  Group Date: 08/05/2023 Start Time: 0320 End Time: 0400 Facilitators: Loleta Dicker, LCSW  Department: [facility]  Number of Participants: 4  Group Focus: check in, clarity of thought, feeling awareness/expression, personal responsibility, problem solving, relapse prevention, and self-awareness Treatment Modality:  Cognitive Behavioral Therapy, Solution-Focused Therapy, and Spiritual Interventions utilized were clarification, exploration, mental fitness, story telling, and support Purpose: enhance coping skills, explore maladaptive thinking, express feelings, express irrational fears, improve communication skills, increase insight, regain self-worth, reinforce self-care, and relapse prevention strategies  Name: Frederick Ballard Date of Birth: 1985/11/02  MR: [id number]    Level of Participation: active Quality of Participation: attentive and cooperative Interactions with others: gave feedback Mood/Affect: appropriate Triggers (if applicable): N/A Cognition: coherent/clear Progress: Gaining insight Description of Group:   This group will address the importance of considering the journey and not just the destination. Patients will be encouraged to process areas in their lives where they found it hard to focus on the good rather than the bad, and identify reasons for maintaining such thought pattern. Facilitator will guide patients utilizing problem- solving interventions to address and improve the way each patient views themselves and their situation. Patients will work through understanding and applying humility when it comes to life changes and the interactions we have with others. Patients will be encouraged to explore ways that they will move forward throughout life's journey, and make healthier decisions for themselves.  Summary of Patient Progress: Patient actively participated in group on today. Patient provided feedback to his  peers regarding their encounters and how they have viewed or seen life. Patient was also receptive to the feedback provided to him during the group. No issues to report.  Plan: referral / recommendations  Patients Problems:  Patient Active Problem List   Diagnosis Date Noted   Substance induced mood disorder (HCC) 01/16/2021   Alcohol abuse 01/16/2021   Cocaine use disorder, moderate, in controlled environment (HCC) 01/16/2021   Polysubstance abuse (HCC) 07/24/2011   Schizoaffective disorder, depressive type (HCC) 07/24/2011

## 2023-08-05 NOTE — Discharge Instructions (Signed)
[facility] [address], [place], [address] [phone] phone  New Patient Assessment/Therapy Walk-Ins:  Monday and Wednesday: 8 am until slots are full. Every 1st and 2nd Fridays of the month: 1 pm - 5 pm.  NO ASSESSMENT/THERAPY WALK-INS ON TUESDAYS OR THURSDAYS  New Patient Assessment/Medication Management Walk-Ins:  Monday - Friday:  8 am - 11 am.  For all walk-ins, we ask that you arrive by 7:30 am because patients will be seen in the order of arrival.  Availability is limited; therefore, you may not be seen on the same day that you walk-in.  Our goal is to serve and meet the needs of our community to the best of our [place] ability.  SUBSTANCE USE TREATMENT for Medicaid and State Funded/IPRS  [facility] [address], [place], [address] [phone] phone NOTE: [facility] is no longer offering IOP services.  Serves those who are low-income or have no insurance.  [facility] [address] [place], [place], [address] [phone] phone [phone] fax NOTE: Does have Substance Abuse-Intensive Outpatient Program [facility]) as well as transitional housing if eligible.  [facility] [address] [place], [place], [address] [phone] phone [phone] fax  [facility] [phone] [address] [place], [place], [address] [phone] phone [phone] fax  HALFWAY HOUSES:  [facility] [phone]  [employer].[email or web address]  12 STEP PROGRAMS:  Alcoholics Anonymous of [place] Software[employer]alet.be  Narcotics Anonymous of [place] [employer]ct.dk  Al-Anon of [place], [place] www.[place]alanon.org/find-meetings.html  Nar-Anon https://nar-anon.org/find-a-meetin  List of Residential placements:   [facility] in [place]: [phone]  [facility]: [phone]  [place], [place]  [phone]: Male and male facility; 30-day program: (uninsured and Medicaid such as Laurena Bering, [place], [place], partners)  [facility]: [phone]; men and women's facility; 28 days; Can have Medicaid tailored plan Tour manager or Partners)  [facility]: [phone] Karoline Caldwell or Larita Fife; 28 day program; must be fully detox; tailored Medicaid or no insurance  [address] in [place], [place]; [phone]; 28 day all males program; no insurance accepted  [facility] Referral in [place]: Gabriel Rung [phone] (no insurance or Medicaid only); 90 days; outpatient services but provide housing in apartments downtown [place]  [facility] Admission: [phone]: Patient must complete phone screening for placement: [place], [place]; 6 month program; uninsured, Medicaid, and [employer].   [facility]: no insurance required; [phone]  [facility]: [phone]; Intake: Molly Maduro; Must fill out application online; Alecia Lemming Delay [phone] [facility] in [place], [place]: [phone]; Admissions Coordinators Mr. Maurine Minister or Barron Alvine; 90 day program.  [facility]: [place], [place] [phone]; Co-Ed 9 month to a year program; Online application; Men entry fee is $500 (6-68months);  [employer]: [address] [place], [place] [address]; no fee or insurance required; minimum of 2 years; Highly structured; work based; Intake Coordinator is Thayer Ohm [phone]  [facility] in [place], [place]: [phone]; Fax number is [phone]; website: [facility].org; Requires 3-6 page autobiography; 2 year program (18 months and then 9month transitional housing); Admission fee is $300; no insurance needed; work Automotive engineer in [place], [place]: [employer] Desk Staff: Danise Edge [phone]: They have a Men's Regenerations Program 6-34months. Free program; There is an initial $300 fee however, they are willing to work  with patients regarding that. Application is online.  [facility]: Admissions [phone] Doran Heater ext [phone]; Any 7-90 day program is out of pocket; 12  month program is free of charge; there is a $275 entry fee; Patient is responsible for own transportation

## 2023-08-05 NOTE — Group Note (Signed)
Group Topic: Recovery Basics  Group Date: 08/05/2023 Start Time: 2000 End Time: 2100 Facilitators: Caroline More, LPN  Department: [facility]  Number of Participants: 5  Group Focus: abuse issues Treatment Modality:  Psychoeducation Interventions utilized were story telling Purpose: enhance coping skills, explore maladaptive thinking, express feelings, increase insight, regain self-worth, reinforce self-care, relapse prevention strategies, and trigger / craving management  Name: Frederick Ballard Date of Birth: 1985/12/14  MR: [id number]    Level of Participation: moderate Quality of Participation: attentive Interactions with others: gave feedback Mood/Affect: positive Triggers (if applicable): n/a Cognition: coherent/clear Progress: Minimal Response: n/a Plan: follow-up needed  Patients Problems:  Patient Active Problem List   Diagnosis Date Noted   Substance induced mood disorder (HCC) 01/16/2021   Alcohol abuse 01/16/2021   Cocaine use disorder, moderate, in controlled environment (HCC) 01/16/2021   Polysubstance abuse (HCC) 07/24/2011   Schizoaffective disorder, depressive type (HCC) 07/24/2011

## 2023-08-05 NOTE — Group Note (Signed)
Group Topic: Relapse and Recovery  Group Date: 08/05/2023 Start Time: 1410 End Time: 1515 Facilitators: Dickie La, RN  Department: [facility]  Number of Participants: 4  Group Focus: substance abuse education Treatment Modality:  Psychoeducation Interventions utilized were patient education, problem solving, story telling, and support Purpose: enhance coping skills, express feelings, increase insight, and relapse prevention strategies  Name: Frederick Ballard Date of Birth: 1985/12/27  MR: [id number]    Level of Participation: active Quality of Participation: attentive, cooperative, engaged, and offered feedback Interactions with others: gave feedback Mood/Affect: appropriate and positive Triggers (if applicable): none Cognition: coherent/clear, goal directed, and insightful Progress: Gaining insight Response: Pt participated in AA group and gave positive feedback.  Plan: follow-up needed  Patients Problems:  Patient Active Problem List   Diagnosis Date Noted   Substance induced mood disorder (HCC) 01/16/2021   Alcohol abuse 01/16/2021   Cocaine use disorder, moderate, in controlled environment (HCC) 01/16/2021   Polysubstance abuse (HCC) 07/24/2011   Schizoaffective disorder, depressive type (HCC) 07/24/2011

## 2023-08-05 NOTE — ED Provider Notes (Addendum)
Facility Based Crisis Admission H&P  Date: 08/05/23 Patient Name: Frederick Ballard MRN: [id number] Chief Complaint: "Here to get help"  Diagnoses:  Final diagnoses:  Polysubstance abuse (HCC)  Alcohol use disorder, severe, dependence (HCC)  Cocaine use disorder (HCC)    HPI:  Frederick Ballard is a 38 yo male, with history of polysubstance use (alcohol, cocaine, tobacco, cannabis), with prior documented psychiatric diagnoses of schizoaffective disorder, depressed type, prior psychiatric admissions 3X (last in 2013), admitted to [facility] for substance detox and requesting placement for [place] [facility]  Patient reports that around [place] of last year he completed a 5-week residential program at [facility] in [place] after an admission to [facility] (03/27/2023-03/31/2023).  Confirms he relapsed immediately after being discharged and has been using alcohol and cocaine daily since then.  Patient also reports being noncompliant on medications during this time. He has been drinking at the minimum 44 ounces of malt liquor daily. At times he will often have several shots of liquor if they are available. His last use of both substances was last night. He reports he has been consuming alcohol since age 24. Denies any hx of seizures or Dts related ot alcohol withdrawal.  Reports he first started using cocaine around age 7. He reports he has been using daily or whenever it is available, 1/3 gram-1/2 gram daily.   Longest period of sobriety was for 7 months when he was at [facility] in [place] some years back.  Reports he had an upcoming court 08/04/2022 for child support, but insists he did not present to this facility to avoid the hearing.He denies SI, denies HI, denies AVH.   He declines any medications being started today, he reports he feels well.   Ultimately he reports he is interested in [place] housing.  He declines any offer for psychotropic medications  Psychiatric ROS Mood Symptoms Denies any  depression, denies change sin appeite, denies changes in sleep. He denies any symptoms of hopelessness /worhtlessness/guilt  Manic Symptoms Denies any significant periods of expansive energy or mood consistent with manic or hypomanic episodes.  Anxiety Symptoms Reports he worries often and aout a lot fothings, mostly associated with is kids.   Trauma Symptoms Denies any hx of meotional/physical trauma.  Psychosis Symptoms  Vague hx of AVH when patients was using substances in the past.  He reports in his 74s he was experiencing bizarre behaviors believing he was receiving messages from his boyfriend's girlfriend to date her.  During 7 month period of sobriety he reports he did not experience any AVH, paranoia, or delusions including htought insertions/thought withdrawal/ideas of reference.  Past Psychiatric Hx: Current Psychiatrist:Denies Current Therapist:Denies Previous Psychiatric Diagnoses: Schizoaffective disorder, depressed type, cannabis use disorder, cocaine use disorder Current psychiatric medications: Reports he has been noncompliant on prescribed naltrexone, abilify, sertraline he received at [facility] Psychiatric medication history/compliance: Has previously been prescribed Abilify, sertraline, naltrexone, Invega and trazodone , seroquel Psychiatric Hospitalization hx: 3 prior psychiatric hospitalizations identified on chart review, last in 2013 at [facility] as a voluntary admission for SI with plan to OD on iodine. Psychotherapy hx: Neuromodulation history: None identified on chart review History of suicide (obtained from HPI): [facility] admission in 2013 for SI with plan to OD on iodine, per chart review he also reported attempting to hang himself with a rope History of homicide or aggression (obtained in HPI):  Past Medical History: PCP: Denies Medical Dx: Denies Medications: Denies Allergies: Invega Hospitalizations:Denies Surgeries: Joint replacement surgery identified on  chart review Trauma:  reports he hit his head a couple of weeks ago, but did not receive medical care  Seizures: None identified on chart review  Family Medical History: Father - CVA, MI, hx of drug abuse  Family Psychiatric History: Psychiatric Dx: unknown to pt Suicide Hx: denies Violence/Aggression: denies   Social History: Living Situation: Living in [place] with a friend for the past 3 months, reports it is not a good environment, there is drug use in the home Social Support: mom (source of emotional support bu can't live with her) Education: HS Occupational hx: unemployed currently, previously worked in Development worker, community at [employer] Marital Status: Single Children: 2 children 39 yo daughter, 40 yo son who lives with mom Legal:  Reports he had an upcoming court 08/04/2022 for child support  Denies any criminal charges  Military: Served in [employer] 2020-2023, reports he is currently not service connected. He reports he had a dishonorable discharge.  Access to firearms: Denies   PHQ 2-9:  Flowsheet Row ED from 03/27/2023 in [facility] Most recent reading at 03/31/2023 10:18 AM ED from 03/27/2023 in [facility] Most recent reading at 03/27/2023 11:04 AM  Thoughts that you would be better off dead, or of hurting yourself in some way Several days Several days  PHQ-9 Total Score 12 13       Flowsheet Row ED from 08/04/2023 in [facility] Most recent reading at 08/04/2023  4:26 PM ED from 08/04/2023 in [facility] Most recent reading at 08/04/2023  1:09 PM ED from 03/27/2023 in [facility] Most recent reading at 03/27/2023  2:45 PM  C-SSRS RISK CATEGORY No Risk No Risk No Risk       Screenings    Flowsheet Row Most Recent Value  CIWA-Ar Total 2       Total Time spent with patient: 1.5 hours  Musculoskeletal  Strength & Muscle Tone: within  normal limits Gait & Station: normal Patient leans: N/A  Psychiatric Specialty Exam  Presentation General Appearance:  Casual  Eye Contact: Good  Speech: Clear and Coherent; Normal Rate  Speech Volume: Normal  Handedness: Right   Mood and Affect  Mood: Anxious; Depressed  Affect: Congruent   Thought Process  Thought Processes: Coherent  Descriptions of Associations:Intact  Orientation:Full (Time, Place and Person)  Thought Content:Logical  Diagnosis of Schizophrenia or Schizoaffective disorder in past: No   Hallucinations:Hallucinations: None  Ideas of Reference:None  Suicidal Thoughts:Suicidal Thoughts: No  Homicidal Thoughts:Homicidal Thoughts: No   Sensorium  Memory: Immediate Good; Recent Good; Remote Good  Judgment: Fair  Insight: Good   Executive Functions  Concentration: Good  Attention Span: Good  Recall: Good  Fund of Knowledge: Good  Language: Good   Psychomotor Activity  Psychomotor Activity: Psychomotor Activity: Normal   Assets  Assets: Communication Skills; Desire for Improvement; Leisure Time; Physical Health; Resilience; Housing   Sleep  Sleep: Sleep: Poor Number of Hours of Sleep: 5 (5 hours last night but previously has not slept in 3 days)   Nutritional Assessment (For OBS and FBC admissions only) Has the patient had a weight loss or gain of 10 pounds or more in the last 3 months?: No Has the patient had a decrease in food intake/or appetite?: No Does the patient have dental problems?: No Does the patient have eating habits or behaviors that may be indicators of an eating disorder including binging or inducing vomiting?: No Has the patient recently  lost weight without trying?: 0 Has the patient been eating poorly because of a decreased appetite?: 0 Malnutrition Screening Tool Score: 0    Physical Exam ROS  Blood pressure 127/76, pulse 66, temperature 98.1 F (36.7 C), temperature source  Oral, resp. rate 18, SpO2 100%. There is no height or weight on file to calculate BMI.    Last Labs:  Admission on 08/04/2023, Discharged on 08/04/2023  Component Date Value Ref Range Status   WBC 08/04/2023 8.1  4.0 - 10.5 K/uL Final   RBC 08/04/2023 4.86  4.22 - 5.81 MIL/uL Final   Hemoglobin 08/04/2023 14.6  13.0 - 17.0 g/dL Final   HCT 91/47/8295 44.2  39.0 - 52.0 % Final   MCV 08/04/2023 90.9  80.0 - 100.0 fL Final   MCH 08/04/2023 30.0  26.0 - 34.0 pg Final   MCHC 08/04/2023 33.0  30.0 - 36.0 g/dL Final   RDW 62/13/0865 13.2  11.5 - 15.5 % Final   Platelets 08/04/2023 297  150 - 400 K/uL Final   nRBC 08/04/2023 0.0  0.0 - 0.2 % Final   Neutrophils Relative % 08/04/2023 41  % Final   Neutro Abs 08/04/2023 3.3  1.7 - 7.7 K/uL Final   Lymphocytes Relative 08/04/2023 45  % Final   Lymphs Abs 08/04/2023 3.7  0.7 - 4.0 K/uL Final   Monocytes Relative 08/04/2023 10  % Final   Monocytes Absolute 08/04/2023 0.8  0.1 - 1.0 K/uL Final   Eosinophils Relative 08/04/2023 3  % Final   Eosinophils Absolute 08/04/2023 0.2  0.0 - 0.5 K/uL Final   Basophils Relative 08/04/2023 1  % Final   Basophils Absolute 08/04/2023 0.1  0.0 - 0.1 K/uL Final   Immature Granulocytes 08/04/2023 0  % Final   Abs Immature Granulocytes 08/04/2023 0.01  0.00 - 0.07 K/uL Final   Performed at [facility], [address] [place], [place] [address]   Sodium 08/04/2023 140  135 - 145 mmol/L Final   Potassium 08/04/2023 4.6  3.5 - 5.1 mmol/L Final   Chloride 08/04/2023 106  98 - 111 mmol/L Final   CO2 08/04/2023 26  22 - 32 mmol/L Final   Glucose, Bld 08/04/2023 87  70 - 99 mg/dL Final   Glucose reference range applies only to samples taken after fasting for at least 8 hours.   BUN 08/04/2023 14  6 - 20 mg/dL Final   Creatinine, Ser 08/04/2023 0.97  0.61 - 1.24 mg/dL Final   Calcium 62/95/2841 9.7  8.9 - 10.3 mg/dL Final   Total Protein 32/44/0102 6.7  6.5 - 8.1 g/dL Final   Albumin 72/53/6644 4.1  3.5 -  5.0 g/dL Final   AST 03/47/4259 41  15 - 41 U/L Final   ALT 08/04/2023 42  0 - 44 U/L Final   Alkaline Phosphatase 08/04/2023 100  38 - 126 U/L Final   Total Bilirubin 08/04/2023 0.4  0.0 - 1.2 mg/dL Final   GFR, Estimated 08/04/2023 >60  >60 mL/min Final   Comment: (NOTE) Calculated using the CKD-EPI Creatinine Equation (2021)    Anion gap 08/04/2023 8  5 - 15 Final   Performed at [facility], [address], [place], [place] [address]   Magnesium 08/04/2023 2.3  1.7 - 2.4 mg/dL Final   Performed at [facility], [address] [place], [place] [address]   Alcohol, Ethyl (B) 08/04/2023 <10  <10 mg/dL Final   Comment: (NOTE) Lowest detectable limit  for serum alcohol is 10 mg/dL.  For medical purposes only. Performed at [facility], [address] [place], [place] [address]    Vitamin B-12 08/04/2023 361  180 - 914 pg/mL Final   Comment: (NOTE) This assay is not validated for testing neonatal or myeloproliferative syndrome specimens for Vitamin B12 levels. Performed at [facility], [address], [place], [place] [address]    POC Amphetamine UR 08/04/2023 None Detected  NONE DETECTED (Cut Off Level 1000 ng/mL) Final   POC Secobarbital (BAR) 08/04/2023 None Detected  NONE DETECTED (Cut Off Level 300 ng/mL) Final   POC Buprenorphine (BUP) 08/04/2023 None Detected  NONE DETECTED (Cut Off Level 10 ng/mL) Final   POC Oxazepam (BZO) 08/04/2023 None Detected  NONE DETECTED (Cut Off Level 300 ng/mL) Final   POC Cocaine UR 08/04/2023 Positive (A)  NONE DETECTED (Cut Off Level 300 ng/mL) Final   POC Methamphetamine UR 08/04/2023 None Detected  NONE DETECTED (Cut Off Level 1000 ng/mL) Final   POC Morphine 08/04/2023 None Detected  NONE DETECTED (Cut Off Level 300 ng/mL) Final   POC Methadone UR 08/04/2023 None Detected  NONE DETECTED (Cut Off Level 300 ng/mL) Final   POC Oxycodone UR 08/04/2023 None Detected  NONE DETECTED (Cut Off Level 100 ng/mL) Final   POC  Marijuana UR 08/04/2023 None Detected  NONE DETECTED (Cut Off Level 50 ng/mL) Final  Admission on 03/27/2023, Discharged on 03/27/2023  Component Date Value Ref Range Status   WBC 03/27/2023 9.3  4.0 - 10.5 K/uL Final   RBC 03/27/2023 4.84  4.22 - 5.81 MIL/uL Final   Hemoglobin 03/27/2023 14.5  13.0 - 17.0 g/dL Final   HCT 57/84/6962 43.5  39.0 - 52.0 % Final   MCV 03/27/2023 89.9  80.0 - 100.0 fL Final   MCH 03/27/2023 30.0  26.0 - 34.0 pg Final   MCHC 03/27/2023 33.3  30.0 - 36.0 g/dL Final   RDW 95/28/4132 12.8  11.5 - 15.5 % Final   Platelets 03/27/2023 199  150 - 400 K/uL Final   nRBC 03/27/2023 0.0  0.0 - 0.2 % Final   Neutrophils Relative % 03/27/2023 46  % Final   Neutro Abs 03/27/2023 4.3  1.7 - 7.7 K/uL Final   Lymphocytes Relative 03/27/2023 42  % Final   Lymphs Abs 03/27/2023 3.9  0.7 - 4.0 K/uL Final   Monocytes Relative 03/27/2023 8  % Final   Monocytes Absolute 03/27/2023 0.8  0.1 - 1.0 K/uL Final   Eosinophils Relative 03/27/2023 3  % Final   Eosinophils Absolute 03/27/2023 0.3  0.0 - 0.5 K/uL Final   Basophils Relative 03/27/2023 1  % Final   Basophils Absolute 03/27/2023 0.1  0.0 - 0.1 K/uL Final   Immature Granulocytes 03/27/2023 0  % Final   Abs Immature Granulocytes 03/27/2023 0.02  0.00 - 0.07 K/uL Final   Performed at [facility], [address], [place], [place] [address]   Sodium 03/27/2023 136  135 - 145 mmol/L Final   Potassium 03/27/2023 4.0  3.5 - 5.1 mmol/L Final   Chloride 03/27/2023 101  98 - 111 mmol/L Final   CO2 03/27/2023 26  22 - 32 mmol/L Final   Glucose, Bld 03/27/2023 77  70 - 99 mg/dL Final   Glucose reference range applies only to samples taken after fasting for at least 8 hours.   BUN 03/27/2023 12  6 - 20 mg/dL Final   Creatinine, Ser 03/27/2023 0.97  0.61 -  1.24 mg/dL Final   Calcium 28/31/5176 9.4  8.9 - 10.3 mg/dL Final   Total Protein 16/01/3709 7.4  6.5 - 8.1 g/dL Final   Albumin 62/69/4854 4.4  3.5 - 5.0 g/dL Final   AST  62/70/3500 56 (H)  15 - 41 U/L Final   ALT 03/27/2023 53 (H)  0 - 44 U/L Final   Alkaline Phosphatase 03/27/2023 90  38 - 126 U/L Final   Total Bilirubin 03/27/2023 1.0  0.3 - 1.2 mg/dL Final   GFR, Estimated 03/27/2023 >60  >60 mL/min Final   Comment: (NOTE) Calculated using the CKD-EPI Creatinine Equation (2021)    Anion gap 03/27/2023 9  5 - 15 Final   Performed at [facility], [address] [place], [place] [address]   Hgb A1c MFr Bld 03/27/2023 5.5  4.8 - 5.6 % Final   Comment: (NOTE)         Prediabetes: 5.7 - 6.4         Diabetes: >6.4         Glycemic control for adults with diabetes: <7.0    Mean Plasma Glucose 03/27/2023 111  mg/dL Final   Comment: (NOTE) Performed At: [facility] [address] [place], [place] [id number] Jolene Schimke MD [id number]    Alcohol, Ethyl (B) 03/27/2023 <10  <10 mg/dL Final   Comment: (NOTE) Lowest detectable limit for serum alcohol is 10 mg/dL.  For medical purposes only. Performed at [facility], [address], [place], [place] [address]    Cholesterol 03/27/2023 219 (H)  0 - 200 mg/dL Final   Triglycerides 77/82/4235 198 (H)  <150 mg/dL Final   HDL 36/14/4315 61  >40 mg/dL Final   Total CHOL/HDL Ratio 03/27/2023 3.6  RATIO Final   VLDL 03/27/2023 40  0 - 40 mg/dL Final   LDL Cholesterol 03/27/2023 118 (H)  0 - 99 mg/dL Final   Comment:        Total Cholesterol/HDL:CHD Risk Coronary Heart Disease Risk Table                     Men   Women  1/2 Average Risk   3.4   3.3  Average Risk       5.0   4.4  2 X Average Risk   9.6   7.1  3 X Average Risk  23.4   11.0        Use the calculated Patient Ratio above and the CHD Risk Table to determine the patient's CHD Risk.        ATP III CLASSIFICATION (LDL):  <100     mg/dL   Optimal  400-867  mg/dL   Near or Above                    Optimal  130-159  mg/dL   Borderline  619-509  mg/dL   High  >326     mg/dL   Very High Performed at [facility], [address] [place], [place] [address]    TSH 03/27/2023 1.627  0.350 - 4.500 uIU/mL Final   Comment: Performed by a 3rd Generation assay with a functional sensitivity of <=0.01 uIU/mL. Performed at [facility], [address] [place], [place] [address]    Neisseria Gonorrhea 03/27/2023 Negative   Final   Chlamydia 03/27/2023 Negative   Final   Comment 03/27/2023 Normal Reference Ranger Chlamydia - Negative   Final  Comment 03/27/2023 Normal Reference Range Neisseria Gonorrhea - Negative   Final   POC Amphetamine UR 03/27/2023 None Detected  NONE DETECTED (Cut Off Level 1000 ng/mL) Final   POC Secobarbital (BAR) 03/27/2023 None Detected  NONE DETECTED (Cut Off Level 300 ng/mL) Final   POC Buprenorphine (BUP) 03/27/2023 None Detected  NONE DETECTED (Cut Off Level 10 ng/mL) Final   POC Oxazepam (BZO) 03/27/2023 None Detected  NONE DETECTED (Cut Off Level 300 ng/mL) Final   POC Cocaine UR 03/27/2023 Positive (A)  NONE DETECTED (Cut Off Level 300 ng/mL) Final   POC Methamphetamine UR 03/27/2023 None Detected  NONE DETECTED (Cut Off Level 1000 ng/mL) Final   POC Morphine 03/27/2023 None Detected  NONE DETECTED (Cut Off Level 300 ng/mL) Final   POC Methadone UR 03/27/2023 None Detected  NONE DETECTED (Cut Off Level 300 ng/mL) Final   POC Oxycodone UR 03/27/2023 None Detected  NONE DETECTED (Cut Off Level 100 ng/mL) Final   POC Marijuana UR 03/27/2023 None Detected  NONE DETECTED (Cut Off Level 50 ng/mL) Final   HIV Screen 4th Generation wRfx 03/27/2023 Non Reactive  Non Reactive Final   Performed at [facility], [address], [place], [place] [address]    Allergies: Hinda Glatter [paliperidone]  Medications:  Facility Ordered Medications  Medication   acetaminophen (TYLENOL) tablet 650 mg   alum & mag hydroxide-simeth (MAALOX/MYLANTA) 200-200-20 MG/5ML suspension 30 mL   hydrOXYzine (ATARAX) tablet 25 mg   loperamide (IMODIUM) capsule 2-4 mg   LORazepam (ATIVAN)  tablet 1 mg   magnesium hydroxide (MILK OF MAGNESIA) suspension 30 mL   multivitamin with minerals tablet 1 tablet   ondansetron (ZOFRAN-ODT) disintegrating tablet 4 mg   thiamine (VITAMIN B1) tablet 100 mg   traZODone (DESYREL) tablet 50 mg   polyethylene glycol (MIRALAX / GLYCOLAX) packet 17 g    Long Term Goals: Improvement in symptoms so as ready for discharge  Short Term Goals: Patient will verbalize feelings in meetings with treatment team members., Patient will attend at least of 50% of the groups daily., Pt will complete the PHQ9 on admission, day 3 and discharge., Patient will participate in completing the [place] Suicide Severity Rating Scale, Patient will score a low risk of violence for 24 hours prior to discharge, and Patient will take medications as prescribed daily.  Medical Decision Making  Psychiatric Diagnoses: Cocaine use disorder Alcohol use disorder, severe dependence Polysubstance abuse  Overall patient appears minimally motivated to participate in unit milieu, declined offer for psychotropic meds citing no significant disturbances in mood.  Overall he appears stable and would be appropriate for discharge tomorrow.  Psychiatric Diagnoses and Treatment:   The patient declined to restart any of his psychotropic medications.  Medical Issues Being Addressed: None   Other PRNs: acetaminophen, 650 mg, Q6H PRN alum & mag hydroxide-simeth, 30 mL, Q4H PRN hydrOXYzine, 25 mg, Q6H PRN loperamide, 2-4 mg, PRN LORazepam, 1 mg, Q6H PRN magnesium hydroxide, 30 mL, Daily PRN ondansetron, 4 mg, Q6H PRN polyethylene glycol, 17 g, Daily PRN traZODone, 50 mg, QHS PRN   Other Labs/Imaging Reviewed: CBC is unremarkable CMP is unremarkable Magnesium is 2.3  vitamin B12 361 UDS positive for cocaine Ethanol level <10  EKG on 08/04/2023: showing prolonged QTc 479 -- Fredericia corrected for HR of 52 is QTc 492 Repeat EKG today Avoid QT prolonging  medications   Disposition: To be determined, patient is interested in [place] housing    Recommendations  Based on my evaluation the patient  does not appear to have an emergency medical condition.  Lorri Frederick, MD 08/05/23  11:37 AM

## 2023-08-06 DIAGNOSIS — F1910 Other psychoactive substance abuse, uncomplicated: Secondary | ICD-10-CM | POA: Diagnosis not present

## 2023-08-06 NOTE — ED Notes (Signed)
Patient sitting in dayroom watching television, calm and collected. No acute distress noted. No concerns voiced. Informed patient to notify staff with any needs or assistance. Patient verbalized understanding or agreement. Safety checks in place per facility policy.

## 2023-08-06 NOTE — ED Notes (Signed)
Patient was provided lunch.

## 2023-08-06 NOTE — ED Notes (Signed)
 Patient was provided dinner

## 2023-08-06 NOTE — Group Note (Signed)
Group Topic: Health and Wellness  Group Date: 08/06/2023 Start Time: 0250 End Time: 0410 Facilitators: Loleta Dicker, LCSW  Department: [facility]  Number of Participants: 4  Group Focus: clarity of thought, daily focus, and self-awareness Treatment Modality:  Cognitive Behavioral Therapy Interventions utilized were story telling and support Purpose: improve communication skills, increase insight, and regain self-worth  Name: Frederick Ballard Date of Birth: 21-Oct-1985  MR: [id number]    Level of Participation: active Quality of Participation: attentive and cooperative Interactions with others: gave feedback Mood/Affect: appropriate Triggers (if applicable): inability to get past his own thoughts  Cognition: coherent/clear and goal directed Progress: Gaining insight Response: Patient participated in group on today. Patient expressed understanding of group rules and confidentiality. Patient was able to mention his first thought regarding certain words and stigmas attached to mental health and substance use. Patient reports this activity challenged him to think differently about his current circumstance and to reconsider options for coping with said stigmas. No issues to report.  Plan: referral / recommendations  Patients Problems:  Patient Active Problem List   Diagnosis Date Noted   Substance induced mood disorder (HCC) 01/16/2021   Alcohol abuse 01/16/2021   Cocaine use disorder, moderate, in controlled environment (HCC) 01/16/2021   Polysubstance abuse (HCC) 07/24/2011   Schizoaffective disorder, depressive type (HCC) 07/24/2011

## 2023-08-06 NOTE — ED Notes (Signed)
Patient resting with eyes closed in no apparent acute distress. Respirations even and unlabored. Environment secured. Safety checks in place according to facility policy.

## 2023-08-06 NOTE — ED Notes (Signed)
Patient alert & oriented x4. Denies intent to harm self or others when asked. Denies A/VH. Patient denies any physical complaints when asked. No acute distress noted. Patient had approached nurses station requesting discharge stating they were told yesterday they were to leave today. Writer reached out to [employer], LCSW and Lorri Frederick, MD for clarification as Clinical research associate had not head anything regarding discharge planning for today regarding this patient. Provider spoke with patient to clarify no discharge is planned for today. Patient voiced understanding, calm and collected. Support and encouragement provided. Routine safety checks conducted per facility protocol. Encouraged patient to notify staff if any thoughts of harm towards self or others arise. Patient verbalizes understanding and agreement.

## 2023-08-06 NOTE — ED Notes (Signed)
Patient was provided breakfast

## 2023-08-06 NOTE — ED Notes (Signed)
Pt A&O x 4, cooperative, pleasant, interactive with staff, no acute distress noted.  Monitoring for safety.

## 2023-08-06 NOTE — Group Note (Signed)
Group Topic: Social Support  Group Date: 08/06/2023 Start Time: 1145 End Time: 1200 Facilitators: Cassandria Anger  Department: [facility]  Number of Participants: 4  Group Focus: healthy friendships Treatment Modality:  Psychoeducation Interventions utilized were patient education Purpose: increase insight  Name: Frederick Ballard Date of Birth: 1986-02-24  MR: [id number]    Level of Participation: moderate Quality of Participation: attentive, cooperative, engaged, and offered feedback Interactions with others: gave feedback Mood/Affect: appropriate, bright, and positive Triggers (if applicable): N/A Cognition: coherent/clear, concrete, and insightful Progress: Moderate Response: Patient explained all the things he his thankful for and what keeps him motivated. Patient stated that his top 3 things he is thankful for is his confidence, mom, and sanity.  Plan: patient will be encouraged to continue to attend group  Patients Problems:  Patient Active Problem List   Diagnosis Date Noted   Substance induced mood disorder (HCC) 01/16/2021   Alcohol abuse 01/16/2021   Cocaine use disorder, moderate, in controlled environment (HCC) 01/16/2021   Polysubstance abuse (HCC) 07/24/2011   Schizoaffective disorder, depressive type (HCC) 07/24/2011

## 2023-08-06 NOTE — ED Notes (Signed)
Patient sitting in dayroom interacting with peers - in LCSW led group. No acute distress noted. No concerns voiced. Informed patient to notify staff with any needs or assistance. Patient verbalized understanding or agreement. Safety checks in place per facility policy.

## 2023-08-06 NOTE — Group Note (Signed)
Group Topic: Social Support  Group Date: 08/06/2023 Start Time: 1900 End Time: 1930 Facilitators: Rae Lips B  Department: [facility]  Number of Participants: 2  Group Focus: acceptance, anxiety, check in, clarity of thought, depression, family, and feeling awareness/expression Treatment Modality:  Individual Therapy Interventions utilized were leisure development Purpose: express feelings  Name: Frederick Ballard Date of Birth: 08/23/1985  MR: [id number]    Level of Participation: minimal Quality of Participation: cooperative Interactions with others: gave feedback Mood/Affect: appropriate Triggers (if applicable): NA Cognition: coherent/clear Progress: Minimal Response: NA Plan: patient will be encouraged to keep going to groups.   Patients Problems:  Patient Active Problem List   Diagnosis Date Noted   Substance induced mood disorder (HCC) 01/16/2021   Alcohol abuse 01/16/2021   Cocaine use disorder, moderate, in controlled environment (HCC) 01/16/2021   Polysubstance abuse (HCC) 07/24/2011   Schizoaffective disorder, depressive type (HCC) 07/24/2011

## 2023-08-06 NOTE — Group Note (Signed)
Group Topic: Wellness  Group Date: 08/06/2023 Start Time: 1150 End Time: 1220 Facilitators: Nami Strawder, Jacklynn Barnacle, RN  Department: [facility]  Number of Participants: 4  Group Focus: nutrition education Treatment Modality:  Interpersonal Therapy Interventions utilized were exploration Purpose: reinforce self-care  Name: NUNCIO RAHILL Date of Birth: 1986-02-13  MR: [id number]    Level of Participation: moderate Quality of Participation: attentive and cooperative Interactions with others: gave feedback Mood/Affect: appropriate Triggers (if applicable): none identified Cognition: coherent/clear Progress: Gaining insight Response: Patient participated in group, engaged appropriately Plan: patient will be encouraged to continue to attend groups, participate in care planning, and reach out to staff as needed  Patients Problems:  Patient Active Problem List   Diagnosis Date Noted   Substance induced mood disorder (HCC) 01/16/2021   Alcohol abuse 01/16/2021   Cocaine use disorder, moderate, in controlled environment (HCC) 01/16/2021   Polysubstance abuse (HCC) 07/24/2011   Schizoaffective disorder, depressive type (HCC) 07/24/2011

## 2023-08-06 NOTE — ED Provider Notes (Signed)
Behavioral Health Progress Note  Date and Time: 08/06/2023 9:05 AM Name: Frederick Ballard MRN:  [id number]  Frederick Ballard is a 38 yo male, with history of polysubstance use (alcohol, cocaine, tobacco, cannabis), with prior documented psychiatric diagnoses of schizoaffective disorder, depressed type, prior psychiatric admissions 3X (last in 2013), admitted to [facility] for substance detox and requesting placement for [facility]   Subjective:   Patient evaluated at bedside.  Reported mood: "calm" Sleep: good Appetite: "normal" reports he is eating 3 meals a day, with snakcs in between Goals for today: He plans on continue calling [facility] today.  Cravings: denies any for cocaine or alcohol, he reports he had some yesterday evening for cocaine Withdrawals he notes some slight tremors, otherwise nothing else.  SI/HI/AVH: denies/denies/ denies respectively  Side effects from medications: NA, no scheduled psychotropic medications.  Other somatic Symptoms: denies. Reports regular bowel movements.    Diagnosis:  Final diagnoses:  Polysubstance abuse (HCC)  Alcohol use disorder, severe, dependence (HCC)  Cocaine use disorder (HCC)    Total Time spent with patient: 30 minutes  Past Psychiatric Hx: Current Psychiatrist:Denies Current Therapist:Denies Previous Psychiatric Diagnoses: Schizoaffective disorder, depressed type, cannabis use disorder, cocaine use disorder Current psychiatric medications: Reports he has been noncompliant on prescribed naltrexone, abilify, sertraline he received at [facility] Psychiatric medication history/compliance: Has previously been prescribed Abilify, sertraline, naltrexone, Invega and trazodone , seroquel Psychiatric Hospitalization hx: 3 prior psychiatric hospitalizations identified on chart review, last in 2013 at [facility] as a voluntary admission for SI with plan to OD on iodine. Psychotherapy hx: Neuromodulation history: None identified on chart  review History of suicide (obtained from HPI): [facility] admission in 2013 for SI with plan to OD on iodine, per chart review he also reported attempting to hang himself with a rope History of homicide or aggression (obtained in HPI):   Past Medical History: PCP: Denies Medical Dx: Denies Medications: Denies Allergies: Invega Hospitalizations:Denies Surgeries: Joint replacement surgery identified on chart review Trauma: reports he hit his head a couple of weeks ago, but did not receive medical care  Seizures: None identified on chart review   Family Medical History: Father - CVA, MI, hx of drug abuse   Family Psychiatric History: Psychiatric Dx: unknown to pt Suicide Hx: denies Violence/Aggression: denies     Social History: Living Situation: Living in [place] with a friend for the past 3 months, reports it is not a good environment, there is drug use in the home Social Support: mom (source of emotional support bu can't live with her) Education: HS Occupational hx: unemployed currently, previously worked in Development worker, community at [employer] Marital Status: Single Children: 2 children 14 yo daughter, 78 yo son who lives with mom Legal:  Reports he had an upcoming court 08/04/2022 for child support  Denies any criminal charges  Military: Served in [employer] 2020-2023, reports he is currently not service connected. He reports he had a dishonorable discharge.   Access to firearms: Denies   Current Medications:  Current Facility-Administered Medications  Medication Dose Route Frequency Provider Last Rate Last Admin   acetaminophen (TYLENOL) tablet 650 mg  650 mg Oral Q6H PRN Ardis Hughs, NP       alum & mag hydroxide-simeth (MAALOX/MYLANTA) 200-200-20 MG/5ML suspension 30 mL  30 mL Oral Q4H PRN Ardis Hughs, NP       hydrOXYzine (ATARAX) tablet 25 mg  25 mg Oral Q6H PRN Ardis Hughs, NP       loperamide (  IMODIUM) capsule 2-4 mg  2-4 mg Oral PRN Ardis Hughs, NP       LORazepam  (ATIVAN) tablet 1 mg  1 mg Oral Q6H PRN Ardis Hughs, NP       magnesium hydroxide (MILK OF MAGNESIA) suspension 30 mL  30 mL Oral Daily PRN Ardis Hughs, NP       multivitamin with minerals tablet 1 tablet  1 tablet Oral Daily Ardis Hughs, NP   1 tablet at 08/05/23 0850   ondansetron (ZOFRAN-ODT) disintegrating tablet 4 mg  4 mg Oral Q6H PRN Ardis Hughs, NP       polyethylene glycol (MIRALAX / GLYCOLAX) packet 17 g  17 g Oral Daily PRN Carrion-Carrero, Youssef Footman, MD       thiamine (VITAMIN B1) tablet 100 mg  100 mg Oral Daily Vernard Gambles H, NP   100 mg at 08/05/23 0850   traZODone (DESYREL) tablet 50 mg  50 mg Oral QHS PRN Ardis Hughs, NP   50 mg at 08/05/23 2119   No current outpatient medications on file.    Labs  Lab Results:  Admission on 08/04/2023, Discharged on 08/04/2023  Component Date Value Ref Range Status   WBC 08/04/2023 8.1  4.0 - 10.5 K/uL Final   RBC 08/04/2023 4.86  4.22 - 5.81 MIL/uL Final   Hemoglobin 08/04/2023 14.6  13.0 - 17.0 g/dL Final   HCT 16/04/9603 44.2  39.0 - 52.0 % Final   MCV 08/04/2023 90.9  80.0 - 100.0 fL Final   MCH 08/04/2023 30.0  26.0 - 34.0 pg Final   MCHC 08/04/2023 33.0  30.0 - 36.0 g/dL Final   RDW 54/03/8118 13.2  11.5 - 15.5 % Final   Platelets 08/04/2023 297  150 - 400 K/uL Final   nRBC 08/04/2023 0.0  0.0 - 0.2 % Final   Neutrophils Relative % 08/04/2023 41  % Final   Neutro Abs 08/04/2023 3.3  1.7 - 7.7 K/uL Final   Lymphocytes Relative 08/04/2023 45  % Final   Lymphs Abs 08/04/2023 3.7  0.7 - 4.0 K/uL Final   Monocytes Relative 08/04/2023 10  % Final   Monocytes Absolute 08/04/2023 0.8  0.1 - 1.0 K/uL Final   Eosinophils Relative 08/04/2023 3  % Final   Eosinophils Absolute 08/04/2023 0.2  0.0 - 0.5 K/uL Final   Basophils Relative 08/04/2023 1  % Final   Basophils Absolute 08/04/2023 0.1  0.0 - 0.1 K/uL Final   Immature Granulocytes 08/04/2023 0  % Final   Abs Immature Granulocytes 08/04/2023  0.01  0.00 - 0.07 K/uL Final   Performed at [facility], [address] [place], [place] [address]   Sodium 08/04/2023 140  135 - 145 mmol/L Final   Potassium 08/04/2023 4.6  3.5 - 5.1 mmol/L Final   Chloride 08/04/2023 106  98 - 111 mmol/L Final   CO2 08/04/2023 26  22 - 32 mmol/L Final   Glucose, Bld 08/04/2023 87  70 - 99 mg/dL Final   Glucose reference range applies only to samples taken after fasting for at least 8 hours.   BUN 08/04/2023 14  6 - 20 mg/dL Final   Creatinine, Ser 08/04/2023 0.97  0.61 - 1.24 mg/dL Final   Calcium 95/62/1308 9.7  8.9 - 10.3 mg/dL Final   Total Protein 65/78/4696 6.7  6.5 - 8.1 g/dL Final   Albumin 29/52/8413 4.1  3.5 - 5.0 g/dL Final   AST 24/40/1027 41  15 -  41 U/L Final   ALT 08/04/2023 42  0 - 44 U/L Final   Alkaline Phosphatase 08/04/2023 100  38 - 126 U/L Final   Total Bilirubin 08/04/2023 0.4  0.0 - 1.2 mg/dL Final   GFR, Estimated 08/04/2023 >60  >60 mL/min Final   Comment: (NOTE) Calculated using the CKD-EPI Creatinine Equation (2021)    Anion gap 08/04/2023 8  5 - 15 Final   Performed at [facility], [address] [place], [place] [address]   Magnesium 08/04/2023 2.3  1.7 - 2.4 mg/dL Final   Performed at [facility], [address] [place], [place] [address]   Alcohol, Ethyl (B) 08/04/2023 <10  <10 mg/dL Final   Comment: (NOTE) Lowest detectable limit for serum alcohol is 10 mg/dL.  For medical purposes only. Performed at [facility], [address] [place], [place] [address]    Vitamin B-12 08/04/2023 361  180 - 914 pg/mL Final   Comment: (NOTE) This assay is not validated for testing neonatal or myeloproliferative syndrome specimens for Vitamin B12 levels. Performed at [facility], [address] [place], [place] [address]    POC Amphetamine UR 08/04/2023 None Detected  NONE DETECTED (Cut Off Level 1000 ng/mL) Final   POC Secobarbital (BAR) 08/04/2023 None Detected  NONE DETECTED (Cut Off  Level 300 ng/mL) Final   POC Buprenorphine (BUP) 08/04/2023 None Detected  NONE DETECTED (Cut Off Level 10 ng/mL) Final   POC Oxazepam (BZO) 08/04/2023 None Detected  NONE DETECTED (Cut Off Level 300 ng/mL) Final   POC Cocaine UR 08/04/2023 Positive (A)  NONE DETECTED (Cut Off Level 300 ng/mL) Final   POC Methamphetamine UR 08/04/2023 None Detected  NONE DETECTED (Cut Off Level 1000 ng/mL) Final   POC Morphine 08/04/2023 None Detected  NONE DETECTED (Cut Off Level 300 ng/mL) Final   POC Methadone UR 08/04/2023 None Detected  NONE DETECTED (Cut Off Level 300 ng/mL) Final   POC Oxycodone UR 08/04/2023 None Detected  NONE DETECTED (Cut Off Level 100 ng/mL) Final   POC Marijuana UR 08/04/2023 None Detected  NONE DETECTED (Cut Off Level 50 ng/mL) Final  Admission on 03/27/2023, Discharged on 03/27/2023  Component Date Value Ref Range Status   WBC 03/27/2023 9.3  4.0 - 10.5 K/uL Final   RBC 03/27/2023 4.84  4.22 - 5.81 MIL/uL Final   Hemoglobin 03/27/2023 14.5  13.0 - 17.0 g/dL Final   HCT 95/28/4132 43.5  39.0 - 52.0 % Final   MCV 03/27/2023 89.9  80.0 - 100.0 fL Final   MCH 03/27/2023 30.0  26.0 - 34.0 pg Final   MCHC 03/27/2023 33.3  30.0 - 36.0 g/dL Final   RDW 44/07/270 12.8  11.5 - 15.5 % Final   Platelets 03/27/2023 199  150 - 400 K/uL Final   nRBC 03/27/2023 0.0  0.0 - 0.2 % Final   Neutrophils Relative % 03/27/2023 46  % Final   Neutro Abs 03/27/2023 4.3  1.7 - 7.7 K/uL Final   Lymphocytes Relative 03/27/2023 42  % Final   Lymphs Abs 03/27/2023 3.9  0.7 - 4.0 K/uL Final   Monocytes Relative 03/27/2023 8  % Final   Monocytes Absolute 03/27/2023 0.8  0.1 - 1.0 K/uL Final   Eosinophils Relative 03/27/2023 3  % Final   Eosinophils Absolute 03/27/2023 0.3  0.0 - 0.5 K/uL Final   Basophils Relative 03/27/2023 1  % Final   Basophils Absolute 03/27/2023 0.1  0.0 - 0.1 K/uL  Final   Immature Granulocytes 03/27/2023 0  % Final   Abs Immature Granulocytes 03/27/2023 0.02  0.00 - 0.07 K/uL  Final   Performed at [facility], [address] [place], [place] [address]   Sodium 03/27/2023 136  135 - 145 mmol/L Final   Potassium 03/27/2023 4.0  3.5 - 5.1 mmol/L Final   Chloride 03/27/2023 101  98 - 111 mmol/L Final   CO2 03/27/2023 26  22 - 32 mmol/L Final   Glucose, Bld 03/27/2023 77  70 - 99 mg/dL Final   Glucose reference range applies only to samples taken after fasting for at least 8 hours.   BUN 03/27/2023 12  6 - 20 mg/dL Final   Creatinine, Ser 03/27/2023 0.97  0.61 - 1.24 mg/dL Final   Calcium 64/33/2951 9.4  8.9 - 10.3 mg/dL Final   Total Protein 88/41/6606 7.4  6.5 - 8.1 g/dL Final   Albumin 30/16/0109 4.4  3.5 - 5.0 g/dL Final   AST 32/35/5732 56 (H)  15 - 41 U/L Final   ALT 03/27/2023 53 (H)  0 - 44 U/L Final   Alkaline Phosphatase 03/27/2023 90  38 - 126 U/L Final   Total Bilirubin 03/27/2023 1.0  0.3 - 1.2 mg/dL Final   GFR, Estimated 03/27/2023 >60  >60 mL/min Final   Comment: (NOTE) Calculated using the CKD-EPI Creatinine Equation (2021)    Anion gap 03/27/2023 9  5 - 15 Final   Performed at [facility], [address], [place], [place] [address]   Hgb A1c MFr Bld 03/27/2023 5.5  4.8 - 5.6 % Final   Comment: (NOTE)         Prediabetes: 5.7 - 6.4         Diabetes: >6.4         Glycemic control for adults with diabetes: <7.0    Mean Plasma Glucose 03/27/2023 111  mg/dL Final   Comment: (NOTE) Performed At: [facility] [address] [place], [place] [id number] Jolene Schimke MD [id number]    Alcohol, Ethyl (B) 03/27/2023 <10  <10 mg/dL Final   Comment: (NOTE) Lowest detectable limit for serum alcohol is 10 mg/dL.  For medical purposes only. Performed at [facility], [address], [place], [place] [address]    Cholesterol 03/27/2023 219 (H)  0 - 200 mg/dL Final   Triglycerides 69/48/5462 198 (H)  <150 mg/dL Final   HDL 70/35/0093 61  >40 mg/dL Final   Total CHOL/HDL Ratio 03/27/2023 3.6  RATIO Final   VLDL  03/27/2023 40  0 - 40 mg/dL Final   LDL Cholesterol 03/27/2023 118 (H)  0 - 99 mg/dL Final   Comment:        Total Cholesterol/HDL:CHD Risk Coronary Heart Disease Risk Table                     Men   Women  1/2 Average Risk   3.4   3.3  Average Risk       5.0   4.4  2 X Average Risk   9.6   7.1  3 X Average Risk  23.4   11.0        Use the calculated Patient Ratio above and the CHD Risk Table to determine the patient's CHD Risk.        ATP III CLASSIFICATION (LDL):  <100     mg/dL   Optimal  818-299  mg/dL   Near or Above  Optimal  130-159  mg/dL   Borderline  742-595  mg/dL   High  >638     mg/dL   Very High Performed at [facility], [address], [place], [place] [address]    TSH 03/27/2023 1.627  0.350 - 4.500 uIU/mL Final   Comment: Performed by a 3rd Generation assay with a functional sensitivity of <=0.01 uIU/mL. Performed at [facility], [address] [place], [place] [address]    Neisseria Gonorrhea 03/27/2023 Negative   Final   Chlamydia 03/27/2023 Negative   Final   Comment 03/27/2023 Normal Reference Ranger Chlamydia - Negative   Final   Comment 03/27/2023 Normal Reference Range Neisseria Gonorrhea - Negative   Final   POC Amphetamine UR 03/27/2023 None Detected  NONE DETECTED (Cut Off Level 1000 ng/mL) Final   POC Secobarbital (BAR) 03/27/2023 None Detected  NONE DETECTED (Cut Off Level 300 ng/mL) Final   POC Buprenorphine (BUP) 03/27/2023 None Detected  NONE DETECTED (Cut Off Level 10 ng/mL) Final   POC Oxazepam (BZO) 03/27/2023 None Detected  NONE DETECTED (Cut Off Level 300 ng/mL) Final   POC Cocaine UR 03/27/2023 Positive (A)  NONE DETECTED (Cut Off Level 300 ng/mL) Final   POC Methamphetamine UR 03/27/2023 None Detected  NONE DETECTED (Cut Off Level 1000 ng/mL) Final   POC Morphine 03/27/2023 None Detected  NONE DETECTED (Cut Off Level 300 ng/mL) Final   POC Methadone UR 03/27/2023 None Detected  NONE DETECTED (Cut Off Level  300 ng/mL) Final   POC Oxycodone UR 03/27/2023 None Detected  NONE DETECTED (Cut Off Level 100 ng/mL) Final   POC Marijuana UR 03/27/2023 None Detected  NONE DETECTED (Cut Off Level 50 ng/mL) Final   HIV Screen 4th Generation wRfx 03/27/2023 Non Reactive  Non Reactive Final   Performed at [facility], [address], [place], [place] [address]    Blood Alcohol level:  Lab Results  Component Value Date   [facility] <10 08/04/2023   ETH <10 03/27/2023    Metabolic Disorder Labs: Lab Results  Component Value Date   HGBA1C 5.5 03/27/2023   MPG 111 03/27/2023   No results found for: "PROLACTIN" Lab Results  Component Value Date   CHOL 219 (H) 03/27/2023   TRIG 198 (H) 03/27/2023   HDL 61 03/27/2023   CHOLHDL 3.6 03/27/2023   VLDL 40 03/27/2023   LDLCALC 118 (H) 03/27/2023    Therapeutic Lab Levels: No results found for: "LITHIUM" No results found for: "VALPROATE" No results found for: "CBMZ"  Physical Findings   AUDIT    Flowsheet Row ED from 08/04/2023 in [facility]  Alcohol Use Disorder Identification Test Final Score (AUDIT) 24      PHQ2-9    Flowsheet Row ED from 08/04/2023 in [facility] Most recent reading at 08/05/2023 11:37 AM ED from 03/27/2023 in [facility] Most recent reading at 03/31/2023 10:18 AM ED from 03/27/2023 in [facility] Most recent reading at 03/27/2023 11:04 AM  PHQ-2 Total Score 0 3 3  PHQ-9 Total Score -- 12 13      Flowsheet Row ED from 08/04/2023 in [facility] Most recent reading at 08/04/2023  4:26 PM ED from 08/04/2023 in [facility] Most recent reading at 08/04/2023  1:09 PM ED from 03/27/2023 in [facility] Most recent reading at 03/27/2023  2:45 PM  C-SSRS RISK  CATEGORY No Risk No Risk No Risk        Musculoskeletal  Strength & Muscle  Tone: within normal limits Gait & Station: normal Patient leans: N/A  Psychiatric Specialty Exam  Presentation  General Appearance:  Appropriate for Environment  Eye Contact: Good  Speech: Clear and Coherent; Normal Rate  Speech Volume: Normal  Handedness: -- (not assessed)   Mood and Affect  Mood: Euthymic  Affect: Congruent; Full Range   Thought Process  Thought Processes: Linear  Descriptions of Associations:Intact  Orientation:None  Thought Content:Logical  Diagnosis of Schizophrenia or Schizoaffective disorder in past: No    Hallucinations:Hallucinations: None  Ideas of Reference:None  Suicidal Thoughts:Suicidal Thoughts: No  Homicidal Thoughts:Homicidal Thoughts: No   Sensorium  Memory: Immediate Good; Recent Good; Remote Good  Judgment: Fair  Insight: Fair   Chartered certified accountant: Fair  Attention Span: Fair  Recall: Fair  Fund of Knowledge: Fair  Language: Fair   Psychomotor Activity  Psychomotor Activity:Psychomotor Activity: Normal   Assets  Assets: Desire for Improvement; Resilience; Communication Skills   Sleep  Sleep:Sleep: Good    Physical Exam  Physical Exam Vitals and nursing note reviewed.  Constitutional:      General: He is not in acute distress.    Appearance: He is not ill-appearing.  HENT:     Head: Normocephalic and atraumatic.  Pulmonary:     Effort: Pulmonary effort is normal.  Musculoskeletal:        General: Normal range of motion.  Skin:    General: Skin is warm and dry.  Neurological:     General: No focal deficit present.    Review of Systems  All other systems reviewed and are negative.  Blood pressure 107/72, pulse 70, temperature 98.5 F (36.9 C), temperature source Oral, resp. rate 18, SpO2 99%. There is no height or weight on file to calculate BMI.  Treatment Plan Summary: Daily contact with patient to assess and evaluate symptoms and progress in  treatment and Medication management Psychiatric Diagnoses: Cocaine use disorder Alcohol use disorder, severe dependence Polysubstance abuse     Psychiatric Diagnoses and Treatment:    Revisited discussion on 08/06/23 .The patient declined to start psychotropic medications.   Medical Issues Being Addressed: None     Other PRNs: acetaminophen, 650 mg, Q6H PRN alum & mag hydroxide-simeth, 30 mL, Q4H PRN hydrOXYzine, 25 mg, Q6H PRN loperamide, 2-4 mg, PRN LORazepam, 1 mg, Q6H PRN magnesium hydroxide, 30 mL, Daily PRN ondansetron, 4 mg, Q6H PRN polyethylene glycol, 17 g, Daily PRN traZODone, 50 mg, QHS PRN     Other Labs/Imaging Reviewed: CBC is unremarkable CMP is unremarkable Magnesium is 2.3  vitamin B12 361 UDS positive for cocaine Ethanol level <10   EKG on 08/04/2023: showing prolonged QTc 479 -- Fredericia corrected for HR of 52 is QTc 492 Repeat EKG today Avoid QT prolonging medications     Disposition: To be determined, patient is [facility] to [facility]. He will likely discharge Friday 1/24.  Signed: Lorri Frederick, MD 08/06/2023 9:05 AM

## 2023-08-06 NOTE — ED Notes (Signed)
Patient observed/assessed in room in bed appearing in no immediate distress resting peacefully. Q15 minute checks continued by MHT and nursing staff. Will continue to monitor and support. 

## 2023-08-07 DIAGNOSIS — F1910 Other psychoactive substance abuse, uncomplicated: Secondary | ICD-10-CM

## 2023-08-07 NOTE — ED Notes (Signed)
Pt sleeping at present, no distress noted, respirations even & unlabored.  Monitoring for safety. ?

## 2023-08-07 NOTE — ED Provider Notes (Signed)
FBC/OBS ASAP Discharge Summary  Date and Time: 08/07/2023 9:10 AM  Name: Frederick Ballard  MRN:  [id number]   Discharge Diagnoses:  Final diagnoses:  Polysubstance abuse (HCC)  Alcohol use disorder, severe, dependence (HCC)  Cocaine use disorder [facility])   Stay Summary:  Frederick Ballard is a 38 yo male, with history of polysubstance use (alcohol, cocaine, tobacco, cannabis), with prior documented psychiatric diagnoses of schizoaffective disorder, depressed type, prior psychiatric admissions 3X (last in 2013), admitted to [facility] for substance detox and requesting placement for [place]   During the patient's hospitalization at the [facility], patient had extensive initial psychiatric evaluation, with daily follow-up assessments focused on detoxification management.  Psychiatric diagnoses provided upon initial assessment:  Polysubstance abuse Alcohol use Disorder Cocaine Use Disorder  Patient's medications adjusted during hospitalization:  None, patient declined to start any psychotropic medications  Patient's care was discussed during the interdisciplinary team meeting every day during the hospitalization.  Gradually, patient started adjusting to milieu. The patient was evaluated each day by a clinical provider to ascertain response to treatment. Improvement was noted by the patient's report of decreasing symptoms, improved sleep and appetite, affect, medication tolerance, behavior, and participation in unit programming.  Patient was asked each day to complete a self inventory noting mood, mental status, pain, new symptoms, anxiety and concerns.    Symptoms were reported as significantly decreased or resolved completely by discharge.   On day of discharge, the patient reports that their mood is stable. The patient denied having suicidal thoughts for more than 48 hours prior to discharge.  Patient denies having homicidal thoughts.  Patient denies having auditory hallucinations.  Patient denies any  visual hallucinations or other symptoms of psychosis. The patient was motivated to continue taking medication with a goal of continued improvement in mental health.   The patient reported that their withdrawal symptoms and cravings responded well to the detox regimen, with overall benefit from the detox program. Supportive psychotherapy was provided, and the patient participated in regular group therapy sessions focused on managing cravings and withdrawal. Coping skills, problem-solving, and relaxation techniques were also part of the program's therapeutic interventions.  Labs were reviewed with the patient, and abnormal results were discussed with the patient.  The patient is able to verbalize their individual safety plan to this provider.  # It is recommended to the patient to continue psychiatric medications as prescribed, after discharge from the hospital.    # It is recommended to the patient to follow up with your outpatient psychiatric provider and PCP.  # It was discussed with the patient, the impact of alcohol, drugs, tobacco have been there overall psychiatric and medical wellbeing, and total abstinence from substance use was recommended the patient.ed.  # Prescriptions provided or sent directly to preferred pharmacy at discharge. Patient agreeable to plan. Given opportunity to ask questions. Appears to feel comfortable with discharge.    # In the event of worsening symptoms, the patient is instructed to call the crisis hotline, 911 and or go to the nearest ED for appropriate evaluation and treatment of symptoms. To follow-up with primary care provider for other medical issues, concerns and or health care needs  # Patient was discharged home with mother with a plan to follow up as noted below.     Total Time spent with patient: 30 minutes  Past Psychiatric Hx: Current Psychiatrist:Denies Current Therapist:Denies Previous Psychiatric Diagnoses: Schizoaffective disorder, depressed  type, cannabis use disorder, cocaine use disorder Current psychiatric medications: Reports  he has been noncompliant on prescribed naltrexone, abilify, sertraline he received at [facility] Psychiatric medication history/compliance: Has previously been prescribed Abilify, sertraline, naltrexone, Invega and trazodone , seroquel Psychiatric Hospitalization hx: 3 prior psychiatric hospitalizations identified on chart review, last in 2013 at [facility] as a voluntary admission for SI with plan to OD on iodine. Psychotherapy hx: Neuromodulation history: None identified on chart review History of suicide (obtained from HPI): [facility] admission in 2013 for SI with plan to OD on iodine, per chart review he also reported attempting to hang himself with a rope History of homicide or aggression (obtained in HPI):   Past Medical History: PCP: Denies Medical Dx: Denies Medications: Denies Allergies: Invega Hospitalizations:Denies Surgeries: Joint replacement surgery identified on chart review Trauma: reports he hit his head a couple of weeks ago, but did not receive medical care  Seizures: None identified on chart review   Family Medical History: Father - CVA, MI, hx of drug abuse   Family Psychiatric History: Psychiatric Dx: unknown to pt Suicide Hx: denies Violence/Aggression: denies     Social History: Living Situation: Living in [place] with a friend for the past 3 months, reports it is not a good environment, there is drug use in the home Social Support: mom (source of emotional support bu can't live with her) Education: HS Occupational hx: unemployed currently, previously worked in Development worker, community at [employer] Marital Status: Single Children: 2 children 71 yo daughter, 10 yo son who lives with mom Legal:  Reports he had an upcoming court 08/04/2022 for child support  Denies any criminal charges  Military: Served in [employer] 2020-2023, reports he is currently not service connected. He reports he had a  dishonorable discharge.   Access to firearms: Denies     Current Medications:  Current Facility-Administered Medications  Medication Dose Route Frequency Provider Last Rate Last Admin   acetaminophen (TYLENOL) tablet 650 mg  650 mg Oral Q6H PRN Ardis Hughs, NP       alum & mag hydroxide-simeth (MAALOX/MYLANTA) 200-200-20 MG/5ML suspension 30 mL  30 mL Oral Q4H PRN Ardis Hughs, NP       hydrOXYzine (ATARAX) tablet 25 mg  25 mg Oral Q6H PRN Ardis Hughs, NP       loperamide (IMODIUM) capsule 2-4 mg  2-4 mg Oral PRN Ardis Hughs, NP       LORazepam (ATIVAN) tablet 1 mg  1 mg Oral Q6H PRN Ardis Hughs, NP   1 mg at 08/06/23 2130   magnesium hydroxide (MILK OF MAGNESIA) suspension 30 mL  30 mL Oral Daily PRN Ardis Hughs, NP       multivitamin with minerals tablet 1 tablet  1 tablet Oral Daily Ardis Hughs, NP   1 tablet at 08/06/23 1610   ondansetron (ZOFRAN-ODT) disintegrating tablet 4 mg  4 mg Oral Q6H PRN Ardis Hughs, NP       polyethylene glycol (MIRALAX / GLYCOLAX) packet 17 g  17 g Oral Daily PRN Carrion-Carrero, Savir Blanke, MD       thiamine (VITAMIN B1) tablet 100 mg  100 mg Oral Daily Vernard Gambles H, NP   100 mg at 08/06/23 9604   traZODone (DESYREL) tablet 50 mg  50 mg Oral QHS PRN Ardis Hughs, NP   50 mg at 08/06/23 2130   No current outpatient medications on file.    PTA Medications:  Facility Ordered Medications  Medication   acetaminophen (TYLENOL) tablet 650 mg   alum &  mag hydroxide-simeth (MAALOX/MYLANTA) 200-200-20 MG/5ML suspension 30 mL   hydrOXYzine (ATARAX) tablet 25 mg   loperamide (IMODIUM) capsule 2-4 mg   LORazepam (ATIVAN) tablet 1 mg   magnesium hydroxide (MILK OF MAGNESIA) suspension 30 mL   multivitamin with minerals tablet 1 tablet   ondansetron (ZOFRAN-ODT) disintegrating tablet 4 mg   thiamine (VITAMIN B1) tablet 100 mg   traZODone (DESYREL) tablet 50 mg   polyethylene glycol (MIRALAX /  GLYCOLAX) packet 17 g       08/07/2023    8:11 AM 08/05/2023   11:37 AM 03/31/2023   10:18 AM  Depression screen PHQ 2/9  Decreased Interest 0 0 1  Down, Depressed, Hopeless 0 0 2  PHQ - 2 Score 0 0 3  Altered sleeping   2  Tired, decreased energy   1  Change in appetite   2  Feeling bad or failure about yourself    1  Trouble concentrating   1  Moving slowly or fidgety/restless   1  Suicidal thoughts   1  PHQ-9 Score   12    Flowsheet Row ED from 08/04/2023 in [facility] Most recent reading at 08/04/2023  4:26 PM ED from 08/04/2023 in [facility] Most recent reading at 08/04/2023  1:09 PM ED from 03/27/2023 in [facility] Most recent reading at 03/27/2023  2:45 PM  C-SSRS RISK CATEGORY No Risk No Risk No Risk       Musculoskeletal  Strength & Muscle Tone: within normal limits Gait & Station: normal Patient leans: N/A  Psychiatric Specialty Exam  Presentation  General Appearance:  Appropriate for Environment  Eye Contact: Good  Speech: Clear and Coherent; Normal Rate  Speech Volume: Normal  Handedness: -- (not assessed)   Mood and Affect  Mood: Euthymic  Affect: Congruent; Full Range   Thought Process  Thought Processes: Linear  Descriptions of Associations:Intact  Orientation:None  Thought Content:Logical  Diagnosis of Schizophrenia or Schizoaffective disorder in past: No    Hallucinations:Hallucinations: None  Ideas of Reference:None  Suicidal Thoughts:Suicidal Thoughts: No  Homicidal Thoughts:Homicidal Thoughts: No   Sensorium  Memory: Immediate Good; Recent Good; Remote Good  Judgment: Fair  Insight: Fair   Chartered certified accountant: Fair  Attention Span: Fair  Recall: [employer] of Knowledge: Fair  Language: Fair   Psychomotor Activity  Psychomotor Activity: Psychomotor Activity: Normal   Assets   Assets: Desire for Improvement; Resilience; Communication Skills   Sleep  Sleep: Sleep: Good   Nutritional Assessment (For OBS and FBC admissions only) Has the patient had a weight loss or gain of 10 pounds or more in the last 3 months?: No Has the patient had a decrease in food intake/or appetite?: No Does the patient have dental problems?: No Does the patient have eating habits or behaviors that may be indicators of an eating disorder including binging or inducing vomiting?: No Has the patient recently lost weight without trying?: 0 Has the patient been eating poorly because of a decreased appetite?: 0 Malnutrition Screening Tool Score: 0    Physical Exam  Physical Exam Vitals and nursing note reviewed.  Constitutional:      General: He is not in acute distress.    Appearance: He is not ill-appearing.  HENT:     Head: Normocephalic and atraumatic.  Eyes:     Extraocular Movements: Extraocular movements intact.     Conjunctiva/sclera: Conjunctivae normal.  Pulmonary:     Effort:  Pulmonary effort is normal. No respiratory distress.  Musculoskeletal:        General: Normal range of motion.  Skin:    General: Skin is warm and dry.  Psychiatric:        Mood and Affect: Mood normal.        Behavior: Behavior normal.        Thought Content: Thought content normal.        Judgment: Judgment normal.    Review of Systems  All other systems reviewed and are negative.  Blood pressure (!) 141/86, pulse 77, temperature 98.3 F (36.8 C), temperature source Oral, resp. rate 18, SpO2 100%. There is no height or weight on file to calculate BMI.  Demographic Factors:  Male  Loss Factors: Decrease in vocational status  Historical Factors: Impulsivity  Risk Reduction Factors:   Living with another person, especially a relative  Continued Clinical Symptoms:  Alcohol/Substance Abuse/Dependencies Unstable or Poor Therapeutic Relationship Previous Psychiatric Diagnoses and  Treatments  Cognitive Features That Contribute To Risk:  None    Suicide Risk:  Mild:  Suicidal ideation of limited frequency, intensity, duration, and specificity.  There are no identifiable plans, no associated intent, mild dysphoria and related symptoms, good self-control (both objective and subjective assessment), few other risk factors, and identifiable protective factors, including available and accessible social support.  Plan Of Care/Follow-up recommendations:  Activity: as tolerated  Diet: heart healthy  Other: -Follow-up with your outpatient psychiatric provider -instructions on appointment date, time, and address (location) are provided to you in discharge paperwork.  -Take your psychiatric medications as prescribed at discharge - instructions are provided to you in the discharge paperwork  -If you are prescribed an atypical antipsychotic medication, we recommend that your outpatient psychiatrist follow routine screening for side effects within 3 months of discharge, including monitoring: AIMS scale, height, weight, blood pressure, fasting lipid panel, HbA1c, and fasting blood sugar.   -Recommend total abstinence from alcohol, tobacco, and other illicit drug use at discharge.   -If your psychiatric symptoms recur, worsen, or if you have side effects to your psychiatric medications, call your outpatient psychiatric provider, 911, [phone] or go to the nearest emergency department.  -If suicidal thoughts occur, immediately call your outpatient psychiatric provider, 911, [phone] or go to the nearest emergency department.   Disposition:Discharge to mother's home   Signed: Lorri Frederick, MD 08/07/2023, 9:10 AM

## 2023-10-02 ENCOUNTER — Ambulatory Visit (HOSPITAL_COMMUNITY)
Admission: EM | Admit: 2023-10-02 | Discharge: 2023-10-02 | Disposition: A | Discharge status: Home / Self Care | Payer: Self-pay

## 2023-10-02 ENCOUNTER — Encounter (HOSPITAL_COMMUNITY): Payer: Self-pay

## 2023-10-02 DIAGNOSIS — U071 COVID-19: Secondary | ICD-10-CM

## 2023-10-02 LAB — POC COVID19/FLU A&B COMBO
Covid Antigen, POC: POSITIVE — AB
Influenza A Antigen, POC: NEGATIVE
Influenza B Antigen, POC: NEGATIVE

## 2023-10-02 NOTE — ED Triage Notes (Signed)
 Chief Complaint: cough, runny nose, fever, chest pain, chills and body aches.   Sick exposure: Yes- Niece and cousin had COVID   Onset: 2 days   Prescriptions or OTC medications tried: No    New foods, medications, or products: No  Recent Travel: No

## 2023-10-02 NOTE — Discharge Instructions (Addendum)
 Flu A, Flu B, and Covid testing done today. Covid is positive. This is a viral infection.  This does not require antibiotic treatment.  We focus treatment on improving the symptoms.  We will treat with the following:  Tylenol alternating with ibuprofen for fevers or body aches May use over the counter mucinex for congestion Did not return to work until you are 24 hours without a fever without fever reducing medication Return to urgent care or PCP if symptoms worsen or fail to resolve.

## 2023-10-02 NOTE — ED Provider Notes (Signed)
[facility]    CSN: [id number] Arrival date & time: 10/02/23  1656      History   Chief Complaint Chief Complaint  Patient presents with   Cough   Covid Exposure    HPI Frederick Ballard is a 38 y.o. male.   38 y.o. male who presents to urgent care with complaints of cough, fever, chills, body aches and congestion.  The patient reports the symptoms started about 2 days ago.  He reports that his niece was diagnosed with COVID and he has been around her.  He has been taking Tylenol.  He is eating and drinking well.  He denies nausea, vomiting, diarrhea, shortness of breath.  He does have some mild chest pain from coughing so much.   Cough Associated symptoms: chest pain, chills, fever and rhinorrhea   Associated symptoms: no ear pain, no rash, no shortness of breath and no sore throat     Past Medical History:  Diagnosis Date   Depression     Patient Active Problem List   Diagnosis Date Noted   Substance induced mood disorder (HCC) 01/16/2021   Alcohol abuse 01/16/2021   Cocaine use disorder, moderate, in controlled environment (HCC) 01/16/2021   Polysubstance abuse (HCC) 07/24/2011   Schizoaffective disorder, depressive type (HCC) 07/24/2011    Past Surgical History:  Procedure Laterality Date   JOINT REPLACEMENT  old basketball injury surrgery on leg.       Home Medications    Prior to Admission medications   Medication Sig Start Date End Date Taking? Authorizing Provider  ARIPiprazole (ABILIFY PO) Take by mouth.   Yes [provider]  SERTRALINE HCL PO Take by mouth.   Yes [provider]  TRAZODONE HCL PO Take by mouth.   Yes [provider]    Family History History reviewed. No pertinent family history.  Social History Social History   Tobacco Use   Smoking status: Every Day    Current packs/day: 1.00    Types: Cigarettes  Substance Use Topics   Alcohol use: Yes    Comment: 40oz. daily   Drug use: Yes     Types: Cocaine, Marijuana    Comment: former marijuana user     Allergies   Invega [paliperidone]   Review of Systems Review of Systems  Constitutional:  Positive for chills and fever.  HENT:  Positive for rhinorrhea. Negative for ear pain and sore throat.   Eyes:  Negative for pain and visual disturbance.  Respiratory:  Positive for cough. Negative for shortness of breath.   Cardiovascular:  Positive for chest pain. Negative for palpitations.  Gastrointestinal:  Negative for abdominal pain and vomiting.  Genitourinary:  Negative for dysuria and hematuria.  Musculoskeletal:  Negative for arthralgias and back pain.       Body aches   Skin:  Negative for color change and rash.  Neurological:  Negative for seizures and syncope.  All other systems reviewed and are negative.    Physical Exam Triage Vital Signs ED Triage Vitals  Encounter Vitals Group     BP --      Systolic BP Percentile --      Diastolic BP Percentile --      Pulse --      Resp --      Temp --      Temp src --      SpO2 --      Weight 10/02/23 1713 165 lb (74.8 kg)  Height 10/02/23 1713 5\' 6"  (1.676 m)     Head Circumference --      Peak Flow --      Pain Score 10/02/23 1712 7     Pain Loc --      Pain Education --      Exclude from Growth Chart --    No data found.  Updated Vital Signs Ht 5\' 6"  (1.676 m)   Wt 165 lb (74.8 kg)   BMI 26.63 kg/m   Visual Acuity Right Eye Distance:   Left Eye Distance:   Bilateral Distance:    Right Eye Near:   Left Eye Near:    Bilateral Near:     Physical Exam Vitals and nursing note reviewed.  Constitutional:      General: He is not in acute distress.    Appearance: He is well-developed.  HENT:     Head: Normocephalic and atraumatic.     Right Ear: Tympanic membrane normal.     Left Ear: Tympanic membrane normal.     Nose: Congestion present.     Mouth/Throat:     Mouth: Mucous membranes are moist.  Eyes:     Conjunctiva/sclera:  Conjunctivae normal.  Cardiovascular:     Rate and Rhythm: Normal rate and regular rhythm.     Heart sounds: No murmur heard. Pulmonary:     Effort: Pulmonary effort is normal. No respiratory distress.     Breath sounds: Normal breath sounds.  Abdominal:     Palpations: Abdomen is soft.     Tenderness: There is no abdominal tenderness.  Musculoskeletal:        General: No swelling.     Cervical back: Neck supple.  Skin:    General: Skin is warm and dry.     Capillary Refill: Capillary refill takes less than 2 seconds.  Neurological:     Mental Status: He is alert.  Psychiatric:        Mood and Affect: Mood normal.      UC Treatments / Results  Labs (all labs ordered are listed, but only abnormal results are displayed) Labs Reviewed  POC COVID19/FLU A&B COMBO    EKG   Radiology No results found.  Procedures Procedures (including critical care time)  Medications Ordered in UC Medications - No data to display  Initial Impression / Assessment and Plan / UC Course  I have reviewed the triage vital signs and the nursing notes.  Pertinent labs & imaging results that were available during my care of the patient were reviewed by me and considered in my medical decision making (see chart for details).     COVID-19   Flu A, Flu B, and Covid testing done today. Covid is positive. This is a viral infection.  This does not require antibiotic treatment.  We focus treatment on improving the symptoms.  Offered prescription medication however patient would prefer to treat conservatively.  We will treat with the following:  Tylenol alternating with ibuprofen for fevers or body aches May use over the counter mucinex for congestion Did not return to work until you are 24 hours without a fever without fever reducing medication Return to urgent care or PCP if symptoms worsen or fail to resolve.    Final Clinical Impressions(s) / UC Diagnoses   Final diagnoses:  None    Discharge Instructions   None    ED Prescriptions   None    PDMP not reviewed this encounter.   Landis Martins, PA-C  10/02/23 1747  

## 2024-03-09 ENCOUNTER — Ambulatory Visit: Payer: Self-pay | Admitting: Physician Assistant

## 2024-03-09 ENCOUNTER — Encounter: Payer: Self-pay | Admitting: Physician Assistant

## 2024-03-09 ENCOUNTER — Other Ambulatory Visit (HOSPITAL_COMMUNITY)
Admission: RE | Admit: 2024-03-09 | Discharge: 2024-03-09 | Disposition: A | Discharge status: Home / Self Care | Payer: Self-pay | Source: Ambulatory Visit | Attending: Physician Assistant | Admitting: Physician Assistant

## 2024-03-09 VITALS — BP 117/73 | HR 72 | Ht 66.0 in | Wt 165.0 lb

## 2024-03-09 DIAGNOSIS — Z113 Encounter for screening for infections with a predominantly sexual mode of transmission: Secondary | ICD-10-CM

## 2024-03-09 DIAGNOSIS — E785 Hyperlipidemia, unspecified: Secondary | ICD-10-CM

## 2024-03-09 DIAGNOSIS — L299 Pruritus, unspecified: Secondary | ICD-10-CM
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
## 2024-03-09 NOTE — Patient Instructions (Addendum)
 VISIT SUMMARY:  Today, you came in for STI screening and to discuss your generalized itching. You mentioned experiencing itching with intermittent 'mosquito bite' like bumps for the past week, following a recent sexual encounter. There are no rashes, discharge, sores, or lesions. You have not had STI testing in the past year but regularly donate plasma, where blood work for hepatitis C and HIV is performed. You are concerned about a potential STI and suspect a change in work uniforms might contribute to the itching.  YOUR PLAN:  -SCREENING FOR SEXUALLY TRANSMITTED INFECTIONS: You requested STI screening after a recent encounter and have not had STI testing in over a year. We will conduct urine tests for gonorrhea, chlamydia, and trichomonas, and blood tests for HIV and syphilis.   -GENERALIZED PRURITUS: Generalized pruritus means generalized itching. Your itching began with new work uniforms and is intermittent, resembling mosquito bites. There are no rashes or other symptoms. We will monitor your symptoms and consider further evaluation if they persist. Pruritus Pruritus is an itchy feeling on the skin. One of the most common causes is dry skin, but many different things can cause itching. Most cases of itching do not require medical attention. Sometimes itchy skin can turn into a rash or a secondary infection. Follow these instructions at home: Skin care  Do not use scented soaps, detergents, perfumes, and cosmetic products. Instead, use gentle, unscented versions of these items. Apply moisturizing creams to your skin frequently, at least twice daily. Apply immediately after bathing while skin is still wet. Take medicines or apply medicated creams only as told by your health care provider. This may include: Corticosteroid cream or topical calcineurin inhibitor. Anti-itch lotions containing urea, camphor, or menthol. Oral antihistamines. Do not take hot showers or baths, which can make itching  worse. A short, cool shower may help with itching as long as you apply moisturizing lotion after the shower. Apply a cool, wet cloth (cool compress) to the affected areas. You may take lukewarm baths with one of the following: Epsom salts. You can get these at your local pharmacy or grocery store. Follow the instructions on the packaging. Baking soda. Pour a small amount into the bath as told by your health care provider. Colloidal oatmeal. You can get this at your local pharmacy or grocery store. Follow the instructions on the packaging. Do not scratch your skin. General instructions Avoid wearing tight clothes. Keep a journal to help find out what is causing your itching. Write down: What you eat and drink. What cosmetic products you use. What soaps or detergents you use. What you wear, including jewelry. Use a humidifier. This keeps the air moist, which helps to prevent dry skin. Be aware of any changes in your itchiness. Tell your health care provider about any changes. Contact a health care provider if: The itching does not go away after several days. You notice redness, warmth, or drainage on the skin where you have scratched. You are unusually thirsty or urinating more than normal. Your skin tingles or feels numb. Your skin or the white parts of your eyes turn yellow (jaundice). You feel weak. You have any of the following: Night sweats. Tiredness (fatigue). Weight loss. Abdominal pain. Summary Pruritus is an itchy feeling on the skin. One of the most common causes is dry skin, but many different conditions and factors can cause itching. Apply moisturizing creams to your skin frequently, at least twice daily. Apply immediately after bathing while skin is still wet. Take medicines or apply  medicated creams only as told by your health care provider. Do not take hot showers or baths. Do not use scented soaps, detergents, perfumes, or cosmetic products. Keep a journal to help find  out what is causing your itching. This information is not intended to replace advice given to you by your health care provider. Make sure you discuss any questions you have with your health care provider. Document Revised: 08/07/2021 Document Reviewed: 08/07/2021 Elsevier Patient Education  2024 [employer].

## 2024-03-10 LAB — URINE CYTOLOGY ANCILLARY ONLY
Chlamydia: NEGATIVE
Comment: NEGATIVE
Comment: NEGATIVE
Comment: NORMAL
Neisseria Gonorrhea: NEGATIVE
Trichomonas: NEGATIVE

## 2024-03-10 LAB — HIV ANTIBODY (ROUTINE TESTING W REFLEX): HIV Screen 4th Generation wRfx: NONREACTIVE

## 2024-03-10 LAB — RPR: RPR Ser Ql: NONREACTIVE

## 2024-03-15 ENCOUNTER — Ambulatory Visit: Payer: Self-pay | Admitting: Physician Assistant

## 2024-03-25 ENCOUNTER — Ambulatory Visit (HOSPITAL_COMMUNITY): Admission: EM | Admit: 2024-03-25 | Discharge: 2024-03-25 | Attending: Psychiatry | Admitting: Psychiatry

## 2024-03-25 ENCOUNTER — Other Ambulatory Visit (HOSPITAL_COMMUNITY)
Admission: EM | Admit: 2024-03-25 | Discharge: 2024-03-30 | Disposition: A | Discharge status: Home / Self Care | Source: Intra-hospital | Attending: Psychiatry | Admitting: Psychiatry

## 2024-03-25 DIAGNOSIS — F32A Depression, unspecified: Secondary | ICD-10-CM | POA: Diagnosis not present

## 2024-03-25 DIAGNOSIS — Z87891 Personal history of nicotine dependence: Secondary | ICD-10-CM | POA: Insufficient documentation

## 2024-03-25 DIAGNOSIS — Z813 Family history of other psychoactive substance abuse and dependence: Secondary | ICD-10-CM | POA: Insufficient documentation

## 2024-03-25 DIAGNOSIS — F1010 Alcohol abuse, uncomplicated: Secondary | ICD-10-CM | POA: Insufficient documentation

## 2024-03-25 DIAGNOSIS — F1410 Cocaine abuse, uncomplicated: Secondary | ICD-10-CM

## 2024-03-25 DIAGNOSIS — F1420 Cocaine dependence, uncomplicated: Secondary | ICD-10-CM | POA: Diagnosis present

## 2024-03-25 DIAGNOSIS — F259 Schizoaffective disorder, unspecified: Secondary | ICD-10-CM | POA: Diagnosis not present

## 2024-03-25 DIAGNOSIS — F1414 Cocaine abuse with cocaine-induced mood disorder: Secondary | ICD-10-CM | POA: Insufficient documentation

## 2024-03-25 DIAGNOSIS — F1090 Alcohol use, unspecified, uncomplicated: Secondary | ICD-10-CM | POA: Diagnosis present

## 2024-03-25 DIAGNOSIS — F1490 Cocaine use, unspecified, uncomplicated: Secondary | ICD-10-CM

## 2024-03-25 DIAGNOSIS — F1994 Other psychoactive substance use, unspecified with psychoactive substance-induced mood disorder: Secondary | ICD-10-CM

## 2024-03-25 DIAGNOSIS — Z79899 Other long term (current) drug therapy: Secondary | ICD-10-CM | POA: Insufficient documentation

## 2024-03-25 DIAGNOSIS — Z556 Problems related to health literacy: Secondary | ICD-10-CM | POA: Diagnosis not present

## 2024-03-25 DIAGNOSIS — Z6372 Alcoholism and drug addiction in family: Secondary | ICD-10-CM | POA: Insufficient documentation

## 2024-03-25 LAB — POCT URINE DRUG SCREEN - MANUAL ENTRY (I-SCREEN)
POC Amphetamine UR: NOT DETECTED
POC Buprenorphine (BUP): NOT DETECTED
POC Cocaine UR: POSITIVE — AB
POC Marijuana UR: NOT DETECTED
POC Methadone UR: NOT DETECTED
POC Methamphetamine UR: NOT DETECTED
POC Morphine: NOT DETECTED
POC Oxazepam (BZO): NOT DETECTED
POC Oxycodone UR: NOT DETECTED
POC Secobarbital (BAR): NOT DETECTED

## 2024-03-25 LAB — LIPID PANEL
Cholesterol: 180 mg/dL (ref 0–200)
HDL: 45 mg/dL (ref >40)
LDL Cholesterol: 101 mg/dL — ABNORMAL HIGH (ref 0–99)
Total CHOL/HDL Ratio: 4 ratio
Triglycerides: 171 mg/dL — ABNORMAL HIGH (ref <150)
VLDL: 34 mg/dL (ref 0–40)

## 2024-03-25 LAB — COMPREHENSIVE METABOLIC PANEL WITH GFR
ALT: 25 U/L (ref 0–44)
AST: 24 U/L (ref 15–41)
Albumin: 3.8 g/dL (ref 3.5–5.0)
Alkaline Phosphatase: 80 U/L (ref 38–126)
Anion gap: 10 (ref 5–15)
BUN: 12 mg/dL (ref 6–20)
CO2: 24 mmol/L (ref 22–32)
Calcium: 9.5 mg/dL (ref 8.9–10.3)
Chloride: 103 mmol/L (ref 98–111)
Creatinine, Ser: 1.12 mg/dL (ref 0.61–1.24)
GFR, Estimated: >60 mL/min (ref >60)
Glucose, Bld: 87 mg/dL (ref 70–99)
Potassium: 4.3 mmol/L (ref 3.5–5.1)
Sodium: 137 mmol/L (ref 135–145)
Total Bilirubin: 0.5 mg/dL (ref 0.0–1.2)
Total Protein: 6.6 g/dL (ref 6.5–8.1)

## 2024-03-25 LAB — CBC WITH DIFFERENTIAL/PLATELET
Abs Immature Granulocytes: 0.02 K/uL (ref 0.00–0.07)
Basophils Absolute: 0.1 K/uL (ref 0.0–0.1)
Basophils Relative: 1 %
Eosinophils Absolute: 0.2 K/uL (ref 0.0–0.5)
Eosinophils Relative: 3 %
HCT: 40.8 % (ref 39.0–52.0)
Hemoglobin: 13.4 g/dL (ref 13.0–17.0)
Immature Granulocytes: 0 %
Lymphocytes Relative: 48 %
Lymphs Abs: 3.3 K/uL (ref 0.7–4.0)
MCH: 30.2 pg (ref 26.0–34.0)
MCHC: 32.8 g/dL (ref 30.0–36.0)
MCV: 92.1 fL (ref 80.0–100.0)
Monocytes Absolute: 0.8 K/uL (ref 0.1–1.0)
Monocytes Relative: 12 %
Neutro Abs: 2.5 K/uL (ref 1.7–7.7)
Neutrophils Relative %: 36 %
Platelets: 306 K/uL (ref 150–400)
RBC: 4.43 MIL/uL (ref 4.22–5.81)
RDW: 13 % (ref 11.5–15.5)
WBC: 6.9 K/uL (ref 4.0–10.5)
nRBC: 0 % (ref 0.0–0.2)

## 2024-03-25 LAB — TSH: TSH: 1.365 u[IU]/mL (ref 0.350–4.500)

## 2024-03-25 LAB — HEMOGLOBIN A1C
Hgb A1c MFr Bld: 5.2 % (ref 4.8–5.6)
Mean Plasma Glucose: 102.54 mg/dL

## 2024-03-25 LAB — ETHANOL: Alcohol, Ethyl (B): <15 mg/dL (ref <15)

## 2024-03-25 LAB — MAGNESIUM: Magnesium: 1.9 mg/dL (ref 1.7–2.4)

## 2024-03-25 MED ORDER — ONDANSETRON 4 MG PO TBDP: 4.0000 mg | ORAL_TABLET | Freq: Four times a day (QID) | ORAL | Status: DC | PRN

## 2024-03-25 MED ORDER — HYDROXYZINE HCL 25 MG PO TABS: 25.0000 mg | ORAL_TABLET | Freq: Four times a day (QID) | ORAL | Status: DC | PRN

## 2024-03-25 MED ORDER — HALOPERIDOL LACTATE 5 MG/ML IJ SOLN: 5.0000 mg | Freq: Three times a day (TID) | INTRAMUSCULAR | Status: DC | PRN

## 2024-03-25 MED ORDER — TRAZODONE HCL 50 MG PO TABS
50.0000 mg | ORAL_TABLET | Freq: Every evening | ORAL | Status: DC | PRN
  Administered 2024-03-25: 50 mg via ORAL
  Filled 2024-03-25: qty 1

## 2024-03-25 MED ORDER — ONDANSETRON 4 MG PO TBDP: 4.0000 mg | ORAL_TABLET | Freq: Four times a day (QID) | ORAL | Status: AC | PRN

## 2024-03-25 MED ORDER — ADULT MULTIVITAMIN W/MINERALS CH
1.0000 | ORAL_TABLET | Freq: Every day | ORAL | Status: DC
  Administered 2024-03-25 – 2024-03-30 (×6): 1 via ORAL
  Filled 2024-03-25 (×6): qty 1

## 2024-03-25 MED ORDER — ACETAMINOPHEN 325 MG PO TABS: 650.0000 mg | ORAL_TABLET | Freq: Four times a day (QID) | ORAL | Status: DC | PRN

## 2024-03-25 MED ORDER — HALOPERIDOL 5 MG PO TABS: 5.0000 mg | ORAL_TABLET | Freq: Three times a day (TID) | ORAL | Status: DC | PRN

## 2024-03-25 MED ORDER — TRAZODONE HCL 50 MG PO TABS: 50.0000 mg | ORAL_TABLET | Freq: Every evening | ORAL | Status: DC | PRN

## 2024-03-25 MED ORDER — HYDROXYZINE HCL 25 MG PO TABS: 25.0000 mg | ORAL_TABLET | Freq: Four times a day (QID) | ORAL | Status: AC | PRN

## 2024-03-25 MED ORDER — LOPERAMIDE HCL 2 MG PO CAPS: 2.0000 mg | ORAL_CAPSULE | ORAL | Status: AC | PRN

## 2024-03-25 MED ORDER — DIPHENHYDRAMINE HCL 50 MG/ML IJ SOLN: 50.0000 mg | Freq: Three times a day (TID) | INTRAMUSCULAR | Status: DC | PRN

## 2024-03-25 MED ORDER — ALUM & MAG HYDROXIDE-SIMETH 200-200-20 MG/5ML PO SUSP: 30.0000 mL | ORAL | Status: DC | PRN

## 2024-03-25 MED ORDER — MAGNESIUM HYDROXIDE 400 MG/5ML PO SUSP: 30.0000 mL | Freq: Every day | ORAL | Status: DC | PRN

## 2024-03-25 MED ORDER — ADULT MULTIVITAMIN W/MINERALS CH: 1.0000 | ORAL_TABLET | Freq: Every day | ORAL | Status: DC

## 2024-03-25 MED ORDER — LORAZEPAM 2 MG/ML IJ SOLN: 2.0000 mg | Freq: Three times a day (TID) | INTRAMUSCULAR | Status: DC | PRN

## 2024-03-25 MED ORDER — DIPHENHYDRAMINE HCL 50 MG PO CAPS: 50.0000 mg | ORAL_CAPSULE | Freq: Three times a day (TID) | ORAL | Status: DC | PRN

## 2024-03-25 MED ORDER — THIAMINE MONONITRATE 100 MG PO TABS
100.0000 mg | ORAL_TABLET | Freq: Every day | ORAL | Status: DC
  Administered 2024-03-26 – 2024-03-30 (×5): 100 mg via ORAL
  Filled 2024-03-25 (×5): qty 1

## 2024-03-25 MED ORDER — LORAZEPAM 1 MG PO TABS: 1.0000 mg | ORAL_TABLET | Freq: Four times a day (QID) | ORAL | Status: DC | PRN

## 2024-03-25 MED ORDER — THIAMINE MONONITRATE 100 MG PO TABS: 100.0000 mg | ORAL_TABLET | Freq: Every day | ORAL | Status: DC

## 2024-03-25 MED ORDER — LOPERAMIDE HCL 2 MG PO CAPS: 2.0000 mg | ORAL_CAPSULE | ORAL | Status: DC | PRN

## 2024-03-25 MED ORDER — HALOPERIDOL LACTATE 5 MG/ML IJ SOLN: 10.0000 mg | Freq: Three times a day (TID) | INTRAMUSCULAR | Status: DC | PRN

## 2024-03-25 MED ORDER — THIAMINE HCL 100 MG/ML IJ SOLN: 100.0000 mg | Freq: Once | INTRAMUSCULAR | Status: DC

## 2024-03-25 NOTE — ED Notes (Signed)
 Patient is in the bedroom calm and composed. NAD. Denies needing anything. Respirations even and unlabored Denies SI but feeling down for his situation. Environment secured per policy. Will continue to monitor for safety.

## 2024-03-25 NOTE — ED Notes (Signed)
 Pt arrived to [facility] bed 153 from [facility].   Pt presents for Detox from ETOH and cocaine.  Pt also stopped taking naltrexone  as well/ Pt presents with depressed mood, reports feelings of malaise and anxiety. Denied current SI plan and intent.  Denied HI and A/V hallucinations.  Q 15 minute observations for safety continue

## 2024-03-25 NOTE — ED Notes (Signed)
 Patient is in the bedroom sleeping. NAD

## 2024-03-25 NOTE — Group Note (Signed)
 Group Topic: Relapse and Recovery  Group Date: 03/25/2024 Start Time: 2000 End Time: 2100 Facilitators: Joan Plowman B  Department: [facility]  Number of Participants: 5  Group Focus: abuse issues Treatment Modality:  Leisure Development Interventions utilized were leisure development Purpose: relapse prevention strategies  Name: Frederick Ballard Date of Birth: Sep 04, 1985  MR: [id number]    Level of Participation: PT DID NOT ATTEND GROUP Quality of Participation: attentive Interactions with others: gave feedback Mood/Affect: appropriate Triggers (if applicable): NA Cognition: coherent/clear Progress: Minimal Response: NA Plan: patient will be encouraged to go to groups  Patients Problems:  Patient Active Problem List   Diagnosis Date Noted   Alcohol use disorder 03/25/2024   Substance induced mood disorder (HCC) 01/16/2021   Alcohol abuse 01/16/2021   Cocaine use disorder, moderate, in controlled environment (HCC) 01/16/2021   Polysubstance abuse (HCC) 07/24/2011   Schizoaffective disorder, depressive type (HCC) 07/24/2011

## 2024-03-25 NOTE — BH Assessment (Signed)
 Comprehensive Clinical Assessment (CCA) Note  03/25/2024 Frederick Ballard [id number]   Disposition: Per Alan Mcardle, NP patient does inpatient criteria.  Substance Abuse Detox is recommended.  Disposition SW to pursue appropriate inpatient options.  The patient demonstrates the following risk factors for suicide: Chronic risk factors for suicide include: psychiatric disorder of Schizoaffective Disorder and substance use disorder. Acute risk factors for suicide include: unemployment. Protective factors for this patient include: positive social support, responsibility to others (children, family), and hope for the future. Considering these factors, the overall suicide risk at this point appears to be low. Patient is appropriate for outpatient follow up.    Frederick Ballard is a 38Y male presenting to [facility] as a vol walkin. Pt was told by [facility] to come to [facility] because he is seeking inpatient treatment for his drug and alcohol use. Pt states he stopped taking his medication (naltrexone ) because it was making him feel sick and sleepy. Pt states his drug and alcohol use has been making him feel depressed and he has had SI this week with no plan, Pt denies AVH and HI. Pt has used 1 gram of cocaine and drank 40oz of alcohol in the last 24hrs. Pt reports no prior SI/HI attempts. Pt has support from sister and his mother. Pt lives with sister and her children and can return there. Pt stated he quit his job due to drug use. Pt reports his only stressor is having to start over again paying child support as he has a 69 year old he finished paying for and now has a 38 year old. He stated  I am trying to get in position so I can do that. Pt reports having trouble sleeping due to drugs. Pt reported having feelings of wanting to  have more life behind me than ahead of me, but not suicide. Pt Denied current SI/HI/AVH. Pt stated he was diagnosed in the past with Schizoaffective disorder but stated  its mild, I don't  really hear voices like that anymore. Stated he was taking medication that was helping but is not currently taking any medications at all.  Patient is able to contract for safety outside of the hospital.  Treatment options were discussed and patient is in agreement with recommendation for inpatient detox @[facility]   Chief Complaint:  Chief Complaint  Patient presents with   Addiction Problem   Alcohol Problem   Depression   Visit Diagnosis: Cocaine Abuse Alcohol Dependence    CCA Screening, Triage and Referral (STR)  Patient Reported Information How did you hear about us ? Self  What Is the Reason for Your Visit/Call Today? Would like to detox from cocaine and alcohol so that he can go to [facility] services for Treatment.  How Long Has This Been Causing You Problems? 1-6 months  What Do You Feel Would Help You the Most Today? Alcohol or Drug Use Treatment   Have You Recently Had Any Thoughts About Hurting Yourself? No  Are You Planning to Commit Suicide/Harm Yourself At This time? No   Flowsheet Row ED from 03/25/2024 in [facility] from 10/02/2023 in [facility] ED from 08/04/2023 in [facility]  C-SSRS RISK CATEGORY Low Risk No Risk No Risk    Have you Recently Had Thoughts About Hurting Someone [name]? No  Are You Planning to Harm Someone at This Time? No  Explanation: NA   Have You Used Any Alcohol or Drugs in the Past 24  Hours? Yes  How Long Ago Did You Use Drugs or Alcohol? In last 24 hrs What Did You Use and How Much? Around 40 oz of alcohol and 1gram cocaine  Do You Currently Have a Therapist/Psychiatrist? No  Name of Therapist/Psychiatrist:  n/a  Have You Been Recently Discharged From Any Office Practice or Programs? No  Explanation of Discharge From Practice/Program: NA     CCA Screening Triage Referral Assessment Type of Contact: Face-to-Face  Telemedicine Service  Delivery:   Is this Initial or Reassessment?   Date Telepsych consult ordered in [facility]:    Time Telepsych consult ordered in [facility]:    Location of Assessment: [facility] Assessment Services  Provider Location: [facility] Assessment Services   Collateral Involvement: None at this time   Does Patient Have a Automotive engineer Guardian? No  Legal Guardian Contact Information: na  Copy of Legal Guardianship Form: No - copy requested  Legal Guardian Notified of Arrival: -- (na)  Legal Guardian Notified of Pending Discharge: -- (na)  If Minor and Not Living with Parent(s), Who has Custody? adult  Is CPS involved or ever been involved? -- (None reported. Pt stated that he was being charged for not paying child support.)  Is APS involved or ever been involved? -- (none reported)   Patient Determined To Be At Risk for Harm To Self or Others Based on Review of Patient Reported Information or Presenting Complaint? No  Method: No Plan  Availability of Means: No access or NA  Intent: Vague intent or NA  Notification Required: No need or identified person  Additional Information for Danger to Others Potential: -- (na)  Additional Comments for Danger to Others Potential: None noted  Are There Guns or Other Weapons in Your Home? No  Types of Guns/Weapons: Denies  Are These Weapons Safely Secured?                            -- (NA)  Who Could Verify You Are Able To Have These Secured: na  Do You Have any Outstanding Charges, Pending Court Dates, Parole/Probation? Pt stated that he was being charged for not paying child support. Court date is tomorrow 08/05/23.  Contacted To Inform of Risk of Harm To Self or Others: -- (na)    Does Patient Present under Involuntary Commitment? No    [place] of Residence: [place]   Patient Currently Receiving the Following Services: Not Receiving Services   Determination of Need: Urgent (48 hours)   Options For Referral: Chemical Dependency  Intensive Outpatient Therapy (CDIOP); Inpatient Hospitalization     CCA Biopsychosocial Patient Reported Schizophrenia/Schizoaffective Diagnosis in Past: Yes (stated had a diagnosis but only mild symptoms)   Strengths: Cooperative, good support system, agreeable, forthcoming.   Mental Health Symptoms Depression:  Change in energy/activity; Fatigue; Sleep (too much or little); Worthlessness (Stated  i messed my life up but i am rebuilding it now)   Duration of Depressive symptoms: Duration of Depressive Symptoms: Greater than two weeks   Mania:  None   Anxiety:   Difficulty concentrating; Restlessness; Fatigue   Psychosis:  None   Duration of Psychotic symptoms:    Trauma:  None   Obsessions:  None   Compulsions:  None   Inattention:  None   Hyperactivity/Impulsivity:  None   Oppositional/Defiant Behaviors:  None   Emotional Irregularity:  None   Other Mood/Personality Symptoms:  n/a    Mental Status Exam Appearance and  self-care  Stature:  Average   Weight:  Average weight   Clothing:  Casual   Grooming:  Normal   Cosmetic use:  None   Posture/gait:  Normal   Motor activity:  Restless   Sensorium  Attention:  Normal   Concentration:  Normal   Orientation:  X5   Recall/memory:  Normal   Affect and Mood  Affect:  Anxious; Congruent   Mood:  Anxious; Depressed   Relating  Eye contact:  Fleeting   Facial expression:  Depressed; Responsive   Attitude toward examiner:  Cooperative   Thought and Language  Speech flow: Slurred (slighly slurred)   Thought content:  Appropriate to Mood and Circumstances   Preoccupation:  None   Hallucinations:  None   Organization:  Coherent   [employer] of Knowledge:  Average   Intelligence:  Average   Abstraction:  Armed forces technical officer:  Fair   Reality Testing:  Adequate   Insight:  Fair   Decision Making:  Vacilates   Social Functioning  Social Maturity:  Responsible  (Was working a job and shares an inherited home with sister and her children.)   Social Judgement:  Normal   Stress  Stressors:  Surveyor, quantity (Child support)   Coping Ability:  Overwhelmed   Skill Deficits:  Scientist, physiological; Self-control; Activities of daily living   Supports:  Family     Religion: Religion/Spirituality Are You A Religious Person?: Yes What is Your Religious Affiliation?: Other ( I believe in a higher power) How Might This Affect Treatment?: It will not affect treatment.  Leisure/Recreation: Leisure / Recreation Do You Have Hobbies?: Yes Leisure and Hobbies: Basketball and being on the internet  Exercise/Diet: Exercise/Diet Do You Exercise?:  (n/a) Have You Gained or Lost A Significant Amount of Weight in the Past Six Months?: No Do You Follow a Special Diet?: Yes Type of Diet: Allergic to shellfish Do You Have Any Trouble Sleeping?: Yes Explanation of Sleeping Difficulties:  I go to bed wide awake and i wake up tired   CCA Employment/Education Employment/Work Situation: Employment / Work Situation Employment Situation: Unemployed Patient's Job has Been Impacted by Current Illness: Yes Describe how Patient's Job has Been Impacted: I quit Has Patient ever Been in the [employer]?: No  Education: Education Last Grade Completed: 12 Did You Product manager?: No Did You Have An Individualized Education Program (IIEP): No Did You Have Any Difficulty At School?: No   CCA Family/Childhood History Family and Relationship History: Family history Does patient have children?: Yes  Childhood History:  Childhood History By whom was/is the patient raised?: Mother, Grandparents Did patient suffer any verbal/emotional/physical/sexual abuse as a child?: No Has patient ever been sexually abused/assaulted/raped as an adolescent or adult?: No Witnessed domestic violence?: No Has patient been affected by domestic violence as an adult?: No       CCA  Substance Use Alcohol/Drug Use: Alcohol / Drug Use Pain Medications: See MAR Prescriptions: See MAR Over the Counter: See MAR History of alcohol / drug use?: Yes (ocassional on weekends) Longest period of sobriety (when/how long): unknown Negative Consequences of Use: Personal relationships, Financial, Work / Programmer, multimedia Withdrawal Symptoms: None (none reported)                         ASAM's:  Six Dimensions of Multidimensional Assessment  Dimension 1:  Acute Intoxication and/or Withdrawal Potential:   Dimension 1:  Description of individual's past and current experiences of  substance use and withdrawal: No signs of withdrawal present  Dimension 2:  Biomedical Conditions and Complications:   Dimension 2:  Description of patient's biomedical conditions and  complications: none reported  Dimension 3:  Emotional, Behavioral, or Cognitive Conditions and Complications:  Dimension 3:  Description of emotional, behavioral, or cognitive conditions and complications: Hx of schizoaffective d/o and not taking prescribed medications  Dimension 4:  Readiness to Change:  Dimension 4:  Description of Readiness to Change criteria: Willing to participate in treatment  Dimension 5:  Relapse, Continued use, or Continued Problem Potential:  Dimension 5:  Relapse, continued use, or continued problem potential critiera description: multiple relapses  Dimension 6:  Recovery/Living Environment:  Dimension 6:  Recovery/Iiving environment criteria description: does not have a supportive recovery environment  ASAM Severity Score: ASAM's Severity Rating Score: 9  ASAM Recommended Level of Treatment: ASAM Recommended Level of Treatment: Level III Residential Treatment   Substance use Disorder (SUD) Substance Use Disorder (SUD)  Checklist Symptoms of Substance Use: Continued use despite having a persistent/recurrent physical/psychological problem caused/exacerbated by use, Continued use despite persistent or  recurrent social, interpersonal problems, caused or exacerbated by use, Recurrent use that results in a failure to fulfill major role obligations (work, school, home)  Recommendations for Services/Supports/Treatments: Recommendations for Services/Supports/Treatments Recommendations For Services/Supports/Treatments: Facility Based Crisis  Disposition Recommendation per psychiatric provider: We recommend inpatient psychiatric hospitalization when medically cleared. Patient is under voluntary admission status at this time; please IVC if attempts to leave hospital.   DSM5 Diagnoses: Patient Active Problem List   Diagnosis Date Noted   Substance induced mood disorder (HCC) 01/16/2021   Alcohol abuse 01/16/2021   Cocaine use disorder, moderate, in controlled environment (HCC) 01/16/2021   Polysubstance abuse (HCC) 07/24/2011   Schizoaffective disorder, depressive type (HCC) 07/24/2011     Referrals to Alternative Service(s): Referred to Alternative Service(s):   Place:   Date:   Time:    Referred to Alternative Service(s):   Place:   Date:   Time:    Referred to Alternative Service(s):   Place:   Date:   Time:    Referred to Alternative Service(s):   Place:   Date:   Time:     Frederick Ballard

## 2024-03-25 NOTE — ED Provider Notes (Signed)
 Facility Based Crisis Admission H&P  Date: 03/25/24 Patient Name: Frederick Ballard MRN: [id number] Chief Complaint: referred by [facility] to come for detox   Diagnoses:  Final diagnoses:  Alcohol use  Cocaine use  Substance induced mood disorder (HCC)    HPI: Frederick Ballard is 38 year old male patient with a documented psychiatric history significant for schizoaffective disorder - depressive type, polysubstance abuse, alcohol use disorder, and cocaine use who was admitted to the [facility] for alcohol and drug use. BAL negative and UDS positive for cocaine.  Patient states that he was referred by [facility] to come here ([facility]) for detox. He reports using cocaine and drinking alcohol. He reports using cocaine since he was 38 years old. He states that he's been using cocaine everyday for the past 6 months, on average, a 1/2 g of cocaine daily, method of use is smoking and states that he last used cocaine last night. He reports drinking alcohol since he was 38 years old. He reports drinking alcohol everyday to every other day, on average, 2-3 beers per day, no liquor or wine. He states that he last consumed alcohol last night. He denies alcohol withdrawal symptoms at this time. He denies a history of alcohol withdrawal, seizures or delirium tremens. He reports past substance abuse treatment here at the [facility] in January 2025, and at [facility] in [place] twice in 2019.  He denies symptoms of depression, mania or anxiety. However, he scored a 14 on the PHQ-9 on exam which indicates moderate depression. He reports feeling depressed 2-3 weeks ago related to a friend of his getting out of jail and depending on him for everything. He reports good sleep. He states that sometimes he dreams about smoking or drinking. He reports a normal appetite. He denies auditory or visual hallucinations. He denies paranoia. Objectively, no signs of acute psychosis.   He denies past inpatient  psychiatric hospitalizations. However, per chart review he was hospitalized at [facility] in January 2013. He reports a psychiatric history of substance abuse, depression and states that he was diagnosed with schizophrenia but he grew out of it. He states that in the past he was taking Trazodone , Zoloft  and Naltrexone  in October 2024 but stopped taking the medications because it made him feel nauseous. He denies outpatient psychiatry or therapy. He denies medical history. He denies taking prescribed medications at this time.   He resides with his older sister and states that his deceased father left her the house. He has 2 children, 60 year old daughter and 79-year-old son. 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 He denies legal issues.    PHQ 2-9:  Flowsheet Row ED from 03/25/2024 in [facility] Most recent reading at 03/25/2024  4:32 PM ED from 03/27/2023 in [facility] Most recent reading at 03/31/2023 10:18 AM ED from 03/27/2023 in [facility] Most recent reading at 03/27/2023 11:04 AM  Thoughts that you would be better off dead, or of hurting yourself in some way Several days Several days Several days  PHQ-9 Total Score 14 12 13     Flowsheet Row ED from 03/25/2024 in [facility] Most recent reading at 03/25/2024  2:54 PM ED from 03/25/2024 in [facility] Most recent reading at 03/25/2024 11:43 AM UC from 10/02/2023 in [facility] Most recent reading at 10/02/2023  5:14 PM  C-SSRS RISK  CATEGORY No Risk Low Risk No Risk      Total Time spent with patient: 30 minutes  Musculoskeletal  Strength & Muscle Tone: within normal limits Gait & Station: normal Patient leans: N/A  Psychiatric Specialty Exam  Presentation General Appearance:  Appropriate for Environment  Eye  Contact: Fair  Speech: Clear and Coherent  Speech Volume: Normal   Mood and Affect  Mood: Euthymic  Affect: Congruent   Thought Process  Thought Processes: Coherent; Linear  Descriptions of Associations:Intact  Orientation:Full (Time, Place and Person)  Thought Content:Logical  Diagnosis of Schizophrenia or Schizoaffective disorder in past: Yes   Hallucinations:Hallucinations: None  Ideas of Reference:None  Suicidal Thoughts:Suicidal Thoughts: No  Homicidal Thoughts:Homicidal Thoughts: No   Sensorium  Memory: Immediate Fair; Recent Fair; Remote Fair  Judgment: Fair  Insight: Fair   Art therapist  Concentration: Fair  Attention Span: Fair  Recall: [employer] of Knowledge: Fair  Language: Fair   Psychomotor Activity  Psychomotor Activity: Psychomotor Activity: Normal   Assets  Assets: Communication Skills; Desire for Improvement; Leisure Time; Physical Health   Sleep  Sleep: Sleep: Fair   Nutritional Assessment (For OBS and [facility] admissions only) Has the patient had a weight loss or gain of 10 pounds or more in the last 3 months?: No Has the patient had a decrease in food intake/or appetite?: No Does the patient have dental problems?: No Does the patient have eating habits or behaviors that may be indicators of an eating disorder including binging or inducing vomiting?: No Has the patient recently lost weight without trying?: 0 Has the patient been eating poorly because of a decreased appetite?: 0 Malnutrition Screening Tool Score: 0    Physical Exam HENT:     Nose: Nose normal.  Eyes:     Conjunctiva/sclera: Conjunctivae normal.  Cardiovascular:     Rate and Rhythm: Normal rate.  Pulmonary:     Effort: Pulmonary effort is normal.  Musculoskeletal:        General: Normal range of motion.     Cervical back: Normal range of motion.  Skin:    Capillary Refill: Capillary refill takes more than 3 seconds.   Neurological:     Mental Status: He is alert and oriented to person, place, and time.    Review of Systems  Constitutional: Negative.   HENT: Negative.    Eyes: Negative.   Respiratory: Negative.    Cardiovascular: Negative.   Gastrointestinal: Negative.   Genitourinary: Negative.   Musculoskeletal: Negative.   Neurological: Negative.   Endo/Heme/Allergies: Negative.   Psychiatric/Behavioral:  Positive for suicidal ideas.     Blood pressure 115/89, pulse 86, temperature 97.6 F (36.4 C), temperature source Oral, resp. rate 18, SpO2 99%. There is no height or weight on file to calculate BMI.  Past Psychiatric History: a documented psychiatric history significant for schizoaffective disorder - depressive type, polysubstance abuse, alcohol use disorder, and cocaine use disorder. Per chart review he was hospitalized at [facility] in January 2013. He reports past substance abuse treatment here at the [facility] in January 2025, and at [facility] in [place] twice in 2019. He has been prescribed in the past Seroquel , Zoloft , Naltrexone  and Trazodone .  Is the patient at risk to self? No  Has the patient been a risk to self in the past 6 months? No .    Has the patient been a risk to self within the distant past? No   Is the patient a risk to others? No  Has the patient been a risk to others in the past 6 months? No   Has the patient been a risk to others within the distant past? No   Past Medical History: No reported history.  Family History: Father history of drug addiction.  Social History: He resides with his older sister and states that his deceased father left her the house. He has 2 children, 67 year old daughter and 32-year-old son. 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 He denies legal issues.   Last Labs:  Admission on 03/25/2024, Discharged on 03/25/2024  Component Date Value Ref Range Status   WBC 03/25/2024 6.9  4.0 - 10.5 K/uL Final   RBC  03/25/2024 4.43  4.22 - 5.81 MIL/uL Final   Hemoglobin 03/25/2024 13.4  13.0 - 17.0 g/dL Final   HCT 90/87/7974 40.8  39.0 - 52.0 % Final   MCV 03/25/2024 92.1  80.0 - 100.0 fL Final   MCH 03/25/2024 30.2  26.0 - 34.0 pg Final   MCHC 03/25/2024 32.8  30.0 - 36.0 g/dL Final   RDW 90/87/7974 13.0  11.5 - 15.5 % Final   Platelets 03/25/2024 306  150 - 400 K/uL Final   nRBC 03/25/2024 0.0  0.0 - 0.2 % Final   Neutrophils Relative % 03/25/2024 36  % Final   Neutro Abs 03/25/2024 2.5  1.7 - 7.7 K/uL Final   Lymphocytes Relative 03/25/2024 48  % Final   Lymphs Abs 03/25/2024 3.3  0.7 - 4.0 K/uL Final   Monocytes Relative 03/25/2024 12  % Final   Monocytes Absolute 03/25/2024 0.8  0.1 - 1.0 K/uL Final   Eosinophils Relative 03/25/2024 3  % Final   Eosinophils Absolute 03/25/2024 0.2  0.0 - 0.5 K/uL Final   Basophils Relative 03/25/2024 1  % Final   Basophils Absolute 03/25/2024 0.1  0.0 - 0.1 K/uL Final   Immature Granulocytes 03/25/2024 0  % Final   Abs Immature Granulocytes 03/25/2024 0.02  0.00 - 0.07 K/uL Final   Performed at [facility], [address] [place], [place] [address]   Sodium 03/25/2024 137  135 - 145 mmol/L Final   Potassium 03/25/2024 4.3  3.5 - 5.1 mmol/L Final   Chloride 03/25/2024 103  98 - 111 mmol/L Final   CO2 03/25/2024 24  22 - 32 mmol/L Final   Glucose, Bld 03/25/2024 87  70 - 99 mg/dL Final   Glucose reference range applies only to samples taken after fasting for at least 8 hours.   BUN 03/25/2024 12  6 - 20 mg/dL Final   Creatinine, Ser 03/25/2024 1.12  0.61 - 1.24 mg/dL Final   Calcium 90/87/7974 9.5  8.9 - 10.3 mg/dL Final   Total Protein 90/87/7974 6.6  6.5 - 8.1 g/dL Final   Albumin 90/87/7974 3.8  3.5 - 5.0 g/dL Final   AST 90/87/7974 24  15 - 41 U/L Final   ALT 03/25/2024 25  0 - 44 U/L Final   Alkaline Phosphatase 03/25/2024 80  38 - 126 U/L Final   Total Bilirubin 03/25/2024 0.5  0.0 - 1.2 mg/dL Final   GFR, Estimated 03/25/2024 >60  >60  mL/min Final   Comment: (NOTE) Calculated using the CKD-EPI Creatinine Equation (2021)    Anion gap 03/25/2024 10  5 - 15 Final   Performed at [facility], [address] [place], [place] [address]   Hgb A1c MFr Bld 03/25/2024 5.2  4.8 - 5.6 % Final  Comment: (NOTE) Diagnosis of Diabetes The following HbA1c ranges recommended by the American Diabetes Association (ADA) may be used as an aid in the diagnosis of diabetes mellitus.  Hemoglobin             Suggested A1C NGSP%              Diagnosis  <5.7                   Non Diabetic  5.7-6.4                Pre-Diabetic  >6.4                   Diabetic  <7.0                   Glycemic control for                       adults with diabetes.     Mean Plasma Glucose 03/25/2024 102.54  mg/dL Final   Performed at [facility], [address], [place], [place] [address]   Magnesium  03/25/2024 1.9  1.7 - 2.4 mg/dL Final   Performed at [facility], [address], [place], [place] [address]   Alcohol, Ethyl (B) 03/25/2024 <15  <15 mg/dL Final   Comment: (NOTE) For medical purposes only. Performed at [facility], [address], [place], [place] [address]    Cholesterol 03/25/2024 180  0 - 200 mg/dL Final   Triglycerides 90/87/7974 171 (H)  <150 mg/dL Final   HDL 90/87/7974 45  >40 mg/dL Final   Total CHOL/HDL Ratio 03/25/2024 4.0  RATIO Final   VLDL 03/25/2024 34  0 - 40 mg/dL Final   LDL Cholesterol 03/25/2024 101 (H)  0 - 99 mg/dL Final   Comment:        Total Cholesterol/HDL:CHD Risk Coronary Heart Disease Risk Table                     Men   Women  1/2 Average Risk   3.4   3.3  Average Risk       5.0   4.4  2 X Average Risk   9.6   7.1  3 X Average Risk  23.4   11.0        Use the calculated Patient Ratio above and the CHD Risk Table to determine the patient's CHD Risk.        ATP III CLASSIFICATION (LDL):  <100     mg/dL   Optimal  899-870  mg/dL   Near or Above                     Optimal  130-159  mg/dL   Borderline  839-810  mg/dL   High  >809     mg/dL   Very High Performed at [facility], [address] [place], [place] [address]    POC Amphetamine UR 03/25/2024 None Detected  NONE DETECTED (Cut Off Level 1000 ng/mL) Final   POC Secobarbital (BAR) 03/25/2024 None Detected  NONE DETECTED (Cut Off Level 300 ng/mL) Final   POC Buprenorphine (BUP) 03/25/2024 None Detected  NONE DETECTED (Cut Off Level 10 ng/mL) Final   POC Oxazepam (BZO) 03/25/2024 None Detected  NONE DETECTED (Cut Off Level 300 ng/mL) Final   POC Cocaine UR 03/25/2024 Positive (A)  NONE DETECTED (Cut Off Level  300 ng/mL) Final   POC Methamphetamine UR 03/25/2024 None Detected  NONE DETECTED (Cut Off Level 1000 ng/mL) Final   POC Morphine 03/25/2024 None Detected  NONE DETECTED (Cut Off Level 300 ng/mL) Final   POC Methadone UR 03/25/2024 None Detected  NONE DETECTED (Cut Off Level 300 ng/mL) Final   POC Oxycodone UR 03/25/2024 None Detected  NONE DETECTED (Cut Off Level 100 ng/mL) Final   POC Marijuana UR 03/25/2024 None Detected  NONE DETECTED (Cut Off Level 50 ng/mL) Final  Office Visit on 03/09/2024  Component Date Value Ref Range Status   Neisseria Gonorrhea 03/09/2024 Negative   Final   Chlamydia 03/09/2024 Negative   Final   Trichomonas 03/09/2024 Negative   Final   Comment 03/09/2024 Normal Reference Range Trichomonas - Negative   Final   Comment 03/09/2024 Normal Reference Ranger Chlamydia - Negative   Final   Comment 03/09/2024 Normal Reference Range Neisseria Gonorrhea - Negative   Final   HIV Screen 4th Generation wRfx 03/09/2024 Non Reactive  Non Reactive Final   Comment: HIV-1/HIV-2 antibodies and HIV-1 p24 antigen were NOT detected. There is no laboratory evidence of HIV infection. HIV Negative    RPR Ser Ql 03/09/2024 Non Reactive  Non Reactive Final  Admission on 10/02/2023, Discharged on 10/02/2023  Component Date Value Ref Range Status   Influenza A Antigen, POC  10/02/2023 Negative  Negative Final   Influenza B Antigen, POC 10/02/2023 Negative  Negative Final   Covid Antigen, POC 10/02/2023 Positive (A)  Negative Final    Allergies: Invega [paliperidone]  Medications:  Facility Ordered Medications  Medication   acetaminophen  (TYLENOL ) tablet 650 mg   alum & mag hydroxide-simeth (MAALOX/MYLANTA) 200-200-20 MG/5ML suspension 30 mL   magnesium  hydroxide (MILK OF MAGNESIA) suspension 30 mL   [START ON 03/26/2024] thiamine  (VITAMIN B1) tablet 100 mg   multivitamin with minerals tablet 1 tablet   LORazepam  (ATIVAN ) tablet 1 mg   hydrOXYzine  (ATARAX ) tablet 25 mg   loperamide  (IMODIUM ) capsule 2-4 mg   ondansetron  (ZOFRAN -ODT) disintegrating tablet 4 mg   haloperidol  (HALDOL ) tablet 5 mg   And   diphenhydrAMINE  (BENADRYL ) capsule 50 mg   haloperidol  lactate (HALDOL ) injection 5 mg   And   diphenhydrAMINE  (BENADRYL ) injection 50 mg   And   LORazepam  (ATIVAN ) injection 2 mg   haloperidol  lactate (HALDOL ) injection 10 mg   And   diphenhydrAMINE  (BENADRYL ) injection 50 mg   And   LORazepam  (ATIVAN ) injection 2 mg   traZODone  (DESYREL ) tablet 50 mg    Long Term Goals: Improvement in symptoms so as ready for discharge  Short Term Goals: Patient will verbalize feelings in meetings with treatment team members., Patient will attend at least of 50% of the groups daily., and Patient will take medications as prescribed daily.  Medical Decision Making  Patient admitted to the [facility] on 03/25/2024 for substance use (cocaine and alcohol). UDS positive for cocaine and alcohol level negative. Anticipated discharge date pending residential placement to [facility] recovery for rehabilitation.   Medication regimen-patient is not interested in medications for depression and is not exhibiting any psychosis requiring medications at this time. He will consider starting naltrexone  to reduce alcohol and cocaine cravings. Will  continue CIWA while and Ativan  1 mg every 6 hours as needed for alcohol withdrawal symptoms greater than 10. However, I do not suspect patient to experience significant withdrawal as he reports no past history of severe withdrawal and does not report increased alcohol  consumption.  Labs BAL negative UDS positive for cocaine CBC and CMP unremarkable TSH WNL Lipid elevated A1c WNL    Recommendations  Based on my evaluation the patient does not appear to have an emergency medical condition.  Teresa Wyline CROME, NP 03/25/24  4:33 PM

## 2024-03-25 NOTE — Discharge Instructions (Signed)
Pt transferred to [facility]. 

## 2024-03-25 NOTE — ED Provider Notes (Cosign Needed Addendum)
 Behavioral Health Urgent Care Medical Screening Exam  Patient Name: Frederick Ballard MRN: [id number] Date of Evaluation: 03/25/24 Chief Complaint: Detox from alcohol and cocaine   Diagnosis:  Final diagnoses:  Alcohol use disorder  Substance induced mood disorder (HCC)  Cocaine abuse (HCC)    History of Present illness: Frederick Ballard 38 y.o., male patient presented to [facility] as a voluntary walk in unaccompanied with complaints of wanting to detox from alcohol and cocaine. Frederick Ballard, is seen face to face by this provider  and chart reviewed on 03/25/24.  Per chart review patient has a past psychiatric history of schizoaffective disorder, depressive type, alcohol use disorder and cocaine use disorder.  He is not currently receiving any outpatient substance abuse or mental health treatment.  He was discharged from [facility] in January 2025.  No pertinent medical history.   On evaluation Frederick Ballard reports that he relapsed on alcohol and cocaine a few months ago and has been feeling depressed and hopeless about maintaining his sobriety and relapsing.  Patient states that he was referred by [facility] to come here for detox prior to admission there.  He denies having any suicidal ideations and denies a history of suicide attempts.  He also denies homicidal ideations.  Patient denies having any psychotic symptoms with or without substance use.  Patient reports that 1 time he was prescribed naltrexone , months ago, but stopped due to it making him very nauseous and vomiting.  Patient reports decreased sleep due to the cocaine use.  Denies changes in appetite patient reports that he has been using both alcohol and cocaine daily for the past couple months.  He reports using about 1/2 to 1 g of cocaine daily by smoking and last used last night.  He reports drinking about 3 to 4-40 ounce beers daily and last used last night.  He denies history of alcohol withdrawals, seizures and delirium tremens.   Objectively there are no obvious signs of withdrawal symptoms at time of assessment. Patient was previously diagnosed with schizoaffective disorder but states that he grew out of it and feels that it was related to his drug use.  Per chart review he was hospitalized at [facility] in 2013 and was prescribed Zoloft  100 mg daily, Seroquel  150 mg at bedtime and as needed trazodone .  Patient does not feel that he needs any psychotropic medications at this time.  Will further discuss medication options to treat his underlying depression that could be leading to his ongoing substance use.  Discussed recommendation for patient to receive treatment at the facility based crisis unit with plan to transfer to [facility] for ongoing substance abuse treatment.  During evaluation Frederick Ballard is sitting up in assessment room, in no acute distress. He is alert & oriented x 4, calm, cooperative and attentive for this assessment.  His mood is dysphoric with congruent affect.  He has normal speech, and behavior.  Objectively there is no evidence of psychosis/mania or delusional thinking. Pt does not appear to be responding to internal or external stimuli.  Patient is able to converse coherently, goal directed thoughts, no distractibility, or pre-occupation.  He denies suicidal/self-harm/homicidal ideation, psychosis, and paranoia.  Patient answered assessment question appropriately.      Flowsheet Row ED from 03/25/2024 in [facility] Most recent reading at 03/25/2024  2:54 PM ED from 03/25/2024 in [facility] Most recent reading at 03/25/2024 11:43 AM UC from 10/02/2023 in [facility] at [facility] Most recent reading at 10/02/2023  5:14 PM  C-SSRS RISK CATEGORY No Risk Low Risk No Risk    Psychiatric Specialty Exam  Presentation  General Appearance:Appropriate for Environment  Eye Contact:Fair  Speech:Clear and Coherent  Speech  Volume:Normal  Handedness:-- (not assessed)   Mood and Affect  Mood: Euthymic  Affect: Congruent   Thought Process  Thought Processes: Coherent; Linear  Descriptions of Associations:Intact  Orientation:Full (Time, Place and Person)  Thought Content:Logical  Diagnosis of Schizophrenia or Schizoaffective disorder in past: Yes   Hallucinations:None  Ideas of Reference:None  Suicidal Thoughts:No  Homicidal Thoughts:No   Sensorium  Memory: Immediate Fair; Recent Fair; Remote Fair  Judgment: Fair  Insight: Fair   Art therapist  Concentration: Fair  Attention Span: Fair  Recall: [employer] of Knowledge: Fair  Language: Fair   Psychomotor Activity  Psychomotor Activity: Normal   Assets  Assets: Manufacturing systems engineer; Desire for Improvement; Leisure Time; Physical Health   Sleep  Sleep: Fair  Number of hours:  5 (5 hours last night but previously has not slept in 3 days)   Physical Exam: Physical Exam Vitals and nursing note reviewed.  Constitutional:      Appearance: Normal appearance.  HENT:     Head: Normocephalic.     Nose: Nose normal.  Eyes:     Extraocular Movements: Extraocular movements intact.  Cardiovascular:     Rate and Rhythm: Normal rate.  Pulmonary:     Effort: Pulmonary effort is normal.  Musculoskeletal:        General: Normal range of motion.     Cervical back: Normal range of motion.  Neurological:     General: No focal deficit present.     Mental Status: He is alert and oriented to person, place, and time.    Review of Systems  Constitutional: Negative.   HENT: Negative.    Eyes: Negative.   Respiratory: Negative.    Cardiovascular: Negative.   Gastrointestinal: Negative.   Genitourinary: Negative.   Musculoskeletal: Negative.   Neurological: Negative.   Endo/Heme/Allergies: Negative.   Psychiatric/Behavioral:  Positive for depression and substance abuse.    Blood pressure 130/84, pulse  73, temperature 98.6 F (37 C), temperature source Oral, resp. rate 18, SpO2 98%. There is no height or weight on file to calculate BMI.  Musculoskeletal: Strength & Muscle Tone: within normal limits Gait & Station: normal Patient leans: N/A   [facility] MSE Discharge Disposition for Follow up and Recommendations: Based on my evaluation I certify that psychiatric inpatient services furnished can reasonably be expected to improve the patient's condition which I recommend transfer to an appropriate accepting facility.   Patient is recommended for treatment at the [facility] for detox from alcohol and cocaine use with plan to follow-up with [facility] residential for ongoing substance abuse treatment.  Patient is voluntary and agrees to this treatment plan.  Plan: -Admit to continuous assessment unit [facility] bed is available -Labs, EKG and UDS ordered to evaluate substances and rule out physiological concerns. -Agitation protocol ordered per policy -CIWA protocol ordered with as needed Ativan  for alcohol withdrawal -No scheduled medications ordered at this time however will further discuss depressive symptoms and options for treatment.  Disposition: Patient accepted to [facility] Alan JAYSON Mcardle, NP 03/25/2024, 9:35 PM

## 2024-03-25 NOTE — ED Notes (Signed)
 Pt is being  monitored by CIWA protocol  CIWA score at 1800 ~ 1 d/t anxiety.  Pt identified no needs at present Q 15 minute observations for safety continue

## 2024-03-25 NOTE — ED Notes (Signed)
 Pt discharged to [facility]

## 2024-03-25 NOTE — Progress Notes (Signed)
   03/25/24 1136  [facility] Triage Screening (Walk-ins at [facility] only)  How Did You Hear About Us ? Self  What Is the Reason for Your Visit/Call Today? Pt Frederick Ballard is a 62Y male presenting to [facility] as a vol walkin. Pt was told by [facility] to come to [facility] because he is seeking inpatient treatment for his drug and alcohol use. Pt states he stopped taking his medication (naltrexone ) because it was making him feel sick and sleepy. Pt states his drug and alcohol use has been making him feel depressed and he has had SI this week with no plan, Pt denies AVH and HI. Pt has used 1 gram of cocaine and drank 40oz of alcohol in the last 24hrs.  How Long Has This Been Causing You Problems? 1 wk - 1 month  Have You Recently Had Any Thoughts About Hurting Yourself? Yes  How long ago did you have thoughts about hurting yourself? Within the last week  Are You Planning to Commit Suicide/Harm Yourself At This time? No  Have you Recently Had Thoughts About Hurting Someone [name]? No  Are You Planning To Harm Someone At This Time? No  Physical Abuse Denies  Verbal Abuse Denies  Sexual Abuse Denies  Exploitation of patient/patient's resources Denies  Are you currently experiencing any auditory, visual or other hallucinations? No  Have You Used Any Alcohol or Drugs in the Past 24 Hours? Yes  What Did You Use and How Much? I gram of Cocaine and 40oz of Alcohol  Do you have any current medical co-morbidities that require immediate attention? No  Clinician description of patient physical appearance/behavior: Pt appears neat and calm. Eye contatct is avoidant.  What Do You Feel Would Help You the Most Today? Treatment for Depression or other mood problem;Alcohol or Drug Use Treatment  Determination of Need Urgent (48 hours)  Options For Referral Facility-Based Crisis;Medication Management;Outpatient Therapy  Determination of Need filed? Yes

## 2024-03-26 DIAGNOSIS — Z556 Problems related to health literacy: Secondary | ICD-10-CM | POA: Diagnosis not present

## 2024-03-26 DIAGNOSIS — F1414 Cocaine abuse with cocaine-induced mood disorder: Secondary | ICD-10-CM | POA: Diagnosis not present

## 2024-03-26 DIAGNOSIS — F32A Depression, unspecified: Secondary | ICD-10-CM | POA: Diagnosis not present

## 2024-03-26 DIAGNOSIS — F259 Schizoaffective disorder, unspecified: Secondary | ICD-10-CM | POA: Diagnosis not present

## 2024-03-26 MED ORDER — TRAZODONE HCL 100 MG PO TABS: 100.0000 mg | ORAL_TABLET | Freq: Every evening | ORAL | Status: DC | PRN

## 2024-03-26 NOTE — ED Notes (Signed)
 Pt has been calm and withdrawn.  CIWA scores this shift have been 0.  Pt reports feeling okay and is hopeful to be able to go to [facility] on Monday Q 15 minute observation for safety continue

## 2024-03-26 NOTE — ED Notes (Signed)
 Pt is calm, cooperative, lying in bed awake, affect flat, interacting appropriately with staff. Pt denies SI/HI/AVH, says he has no complaints at this time. No objective withdrawal symptoms noted. Pt encouraged to continue to work toward treatment goal and to make his needs known to staff. Staff will continue to monitor pt for safety.

## 2024-03-26 NOTE — ED Notes (Signed)
 Pt presents sitting in dayroom eating lunch,  Calm upon approach.  Denied current withdrawal symptoms.  C/O having dreams that he is using and that is bothersome.  Did not identify any needs at present Q 15 minute observations for safety continue

## 2024-03-26 NOTE — ED Notes (Signed)
 Patient is in the bedroom sleeping, NAD.

## 2024-03-26 NOTE — Group Note (Signed)
 Group Topic: Relapse and Recovery  Group Date: 03/26/2024 Start Time: 1545 End Time: 1615 Facilitators: Laneta Renea POUR, NT  Department: [facility]  Number of Participants: 2  Group Focus: relaxation Treatment Modality:  Psychoeducation Interventions utilized were patient education and problem solving Purpose: enhance coping skills  Name: Frederick Ballard Date of Birth: 01-07-1986  MR: [id number]    Level of Participation: active Quality of Participation: cooperative and engaged Interactions with others: gave feedback Mood/Affect: appropriate and positive Triggers (if applicable): none Cognition: coherent/clear and insightful Progress: Gaining insight Response:  I like relaxing in outdoor spaces Plan: patient will be encouraged to attend group  Patients Problems:  Patient Active Problem List   Diagnosis Date Noted   Alcohol use disorder 03/25/2024   Substance induced mood disorder (HCC) 01/16/2021   Alcohol abuse 01/16/2021   Cocaine use disorder, moderate, in controlled environment (HCC) 01/16/2021   Polysubstance abuse (HCC) 07/24/2011   Schizoaffective disorder, depressive type (HCC) 07/24/2011

## 2024-03-26 NOTE — Group Note (Signed)
 Group Topic: Relapse and Recovery  Group Date: 03/26/2024 Start Time: 2000 End Time: 2100 Facilitators: Joan Plowman B  Department: [facility]  Number of Participants: 2  Group Focus: abuse issues and coping skills Treatment Modality:  Individual Therapy and Spiritual Interventions utilized were leisure development Purpose: enhance coping skills, increase insight, relapse prevention strategies, and trigger / craving management  Name: Frederick Ballard Date of Birth: 02-Apr-1986  MR: [id number]    Level of Participation: active Quality of Participation: attentive and cooperative Interactions with others: gave feedback Mood/Affect: appropriate Triggers (if applicable): NA Cognition: coherent/clear Progress: Gaining insight Response: NA Plan: patient will be encouraged to attend groups   Patients Problems:  Patient Active Problem List   Diagnosis Date Noted   Alcohol use disorder 03/25/2024   Substance induced mood disorder (HCC) 01/16/2021   Alcohol abuse 01/16/2021   Cocaine use disorder, moderate, in controlled environment (HCC) 01/16/2021   Polysubstance abuse (HCC) 07/24/2011   Schizoaffective disorder, depressive type (HCC) 07/24/2011

## 2024-03-26 NOTE — ED Provider Notes (Addendum)
 Behavioral Health Progress Note  Date and Time: 03/26/2024 5:28 PM Name: Frederick Ballard MRN:  [id number]  Subjective:   Frederick Ballard is 38 year old male patient with a documented psychiatric history significant for schizoaffective disorder - depressive type, polysubstance abuse, alcohol use disorder, and cocaine use who was admitted to the [facility] for alcohol and drug use. BAL negative and UDS positive for cocaine.   Upon evaluation today, patient is observed in the ED room eating lunch and watching TV.  Patient reports that his sleep has improved however it is still very interrupted because of vivid dreams about him relapsing and using alcohol or cocaine.  He denies having any other withdrawal symptoms and has not required any as needed medications.  He has been compliant with scheduled vitamins.  He does not wish to start any scheduled medications for mood as he feels that his depressive symptoms have already improved and will continue to improve with his sobriety.  Patient has declined naltrexone  as he has experienced nausea while prescribed it before.  He denies experiencing any cravings or withdrawals.  Patient has been attending groups.  Patient is still interested in receiving residential treatment at [facility] and is hopeful to discharge this week with those services in place.  He denies any acute pain or physical complaints.  Patient continues to deny having any suicidal ideations, homicidal ideations, psychotic symptoms.  Patient does not objectively appear to be having any signs of psychosis.   Diagnosis:  Final diagnoses:  Alcohol use  Cocaine use  Substance induced mood disorder (HCC)    Total Time spent with patient: 20 minutes  Past Psychiatric History: a documented psychiatric history significant for schizoaffective disorder - depressive type, polysubstance abuse, alcohol use disorder, and cocaine use disorder. Per chart review he was  hospitalized at [facility] in January 2013. He reports past substance abuse treatment here at the [facility] in January 2025, and at [facility] in [place] twice in 2019. He has been prescribed in the past Seroquel , Zoloft , Naltrexone  and Trazodone .  Past Medical History: None reported Family Psychiatric  History: Pt reports his father had a drug addiction. Social History: He resides with his older sister and states that his deceased father left her the house. He has 2 children, 89 year old daughter and 35-year-old son. 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 He denies legal issues.   Additional Social History:                         Sleep: Fair  Appetite:  Good  Current Medications:  Current Facility-Administered Medications  Medication Dose Route Frequency Provider Last Rate Last Admin   acetaminophen  (TYLENOL ) tablet 650 mg  650 mg Oral Q6H PRN Erling Arrazola C, NP       alum & mag hydroxide-simeth (MAALOX/MYLANTA) 200-200-20 MG/5ML suspension 30 mL  30 mL Oral Q4H PRN Lavanya Roa C, NP       haloperidol  (HALDOL ) tablet 5 mg  5 mg Oral TID PRN Zander Ingham C, NP       And   diphenhydrAMINE  (BENADRYL ) capsule 50 mg  50 mg Oral TID PRN Adreanna Fickel C, NP       haloperidol  lactate (HALDOL ) injection 5 mg  5 mg Intramuscular TID PRN Darnella Zeiter C, NP       And   diphenhydrAMINE  (BENADRYL ) injection 50 mg  50 mg Intramuscular TID PRN Shiesha Jahn C,  NP       And   LORazepam  (ATIVAN ) injection 2 mg  2 mg Intramuscular TID PRN Jermany Rimel C, NP       haloperidol  lactate (HALDOL ) injection 10 mg  10 mg Intramuscular TID PRN Jaylanni Eltringham C, NP       And   diphenhydrAMINE  (BENADRYL ) injection 50 mg  50 mg Intramuscular TID PRN Zayna Toste C, NP       And   LORazepam  (ATIVAN ) injection 2 mg  2 mg Intramuscular TID PRN Eldonna Neuenfeldt C, NP       hydrOXYzine  (ATARAX ) tablet 25 mg  25 mg Oral Q6H PRN Gladine Plude C, NP       loperamide  (IMODIUM )  capsule 2-4 mg  2-4 mg Oral PRN Berma Harts C, NP       LORazepam  (ATIVAN ) tablet 1 mg  1 mg Oral Q6H PRN Wauneta Silveria C, NP       magnesium  hydroxide (MILK OF MAGNESIA) suspension 30 mL  30 mL Oral Daily PRN Mayme Profeta C, NP       multivitamin with minerals tablet 1 tablet  1 tablet Oral Daily Gabbi Whetstone C, NP   1 tablet at 03/26/24 1236   ondansetron  (ZOFRAN -ODT) disintegrating tablet 4 mg  4 mg Oral Q6H PRN Tesla Bochicchio C, NP       thiamine  (VITAMIN B1) tablet 100 mg  100 mg Oral Daily Yaret Hush C, NP   100 mg at 03/26/24 1236   traZODone  (DESYREL ) tablet 100 mg  100 mg Oral QHS PRN Thresa Alan BROCKS, NP       No current outpatient medications on file.    Labs  Lab Results:  Admission on 03/25/2024, Discharged on 03/25/2024  Component Date Value Ref Range Status   WBC 03/25/2024 6.9  4.0 - 10.5 K/uL Final   RBC 03/25/2024 4.43  4.22 - 5.81 MIL/uL Final   Hemoglobin 03/25/2024 13.4  13.0 - 17.0 g/dL Final   HCT 90/87/7974 40.8  39.0 - 52.0 % Final   MCV 03/25/2024 92.1  80.0 - 100.0 fL Final   MCH 03/25/2024 30.2  26.0 - 34.0 pg Final   MCHC 03/25/2024 32.8  30.0 - 36.0 g/dL Final   RDW 90/87/7974 13.0  11.5 - 15.5 % Final   Platelets 03/25/2024 306  150 - 400 K/uL Final   nRBC 03/25/2024 0.0  0.0 - 0.2 % Final   Neutrophils Relative % 03/25/2024 36  % Final   Neutro Abs 03/25/2024 2.5  1.7 - 7.7 K/uL Final   Lymphocytes Relative 03/25/2024 48  % Final   Lymphs Abs 03/25/2024 3.3  0.7 - 4.0 K/uL Final   Monocytes Relative 03/25/2024 12  % Final   Monocytes Absolute 03/25/2024 0.8  0.1 - 1.0 K/uL Final   Eosinophils Relative 03/25/2024 3  % Final   Eosinophils Absolute 03/25/2024 0.2  0.0 - 0.5 K/uL Final   Basophils Relative 03/25/2024 1  % Final   Basophils Absolute 03/25/2024 0.1  0.0 - 0.1 K/uL Final   Immature Granulocytes 03/25/2024 0  % Final   Abs Immature Granulocytes 03/25/2024 0.02  0.00 - 0.07 K/uL Final   Performed at [facility], [address] [place], [place] [address]   Sodium 03/25/2024 137  135 - 145 mmol/L Final   Potassium 03/25/2024 4.3  3.5 - 5.1 mmol/L Final   Chloride 03/25/2024 103  98 - 111 mmol/L Final   CO2 03/25/2024 24  22 -  32 mmol/L Final   Glucose, Bld 03/25/2024 87  70 - 99 mg/dL Final   Glucose reference range applies only to samples taken after fasting for at least 8 hours.   BUN 03/25/2024 12  6 - 20 mg/dL Final   Creatinine, Ser 03/25/2024 1.12  0.61 - 1.24 mg/dL Final   Calcium 90/87/7974 9.5  8.9 - 10.3 mg/dL Final   Total Protein 90/87/7974 6.6  6.5 - 8.1 g/dL Final   Albumin 90/87/7974 3.8  3.5 - 5.0 g/dL Final   AST 90/87/7974 24  15 - 41 U/L Final   ALT 03/25/2024 25  0 - 44 U/L Final   Alkaline Phosphatase 03/25/2024 80  38 - 126 U/L Final   Total Bilirubin 03/25/2024 0.5  0.0 - 1.2 mg/dL Final   GFR, Estimated 03/25/2024 >60  >60 mL/min Final   Comment: (NOTE) Calculated using the CKD-EPI Creatinine Equation (2021)    Anion gap 03/25/2024 10  5 - 15 Final   Performed at [facility], [address] [place], [place] [address]   Hgb A1c MFr Bld 03/25/2024 5.2  4.8 - 5.6 % Final   Comment: (NOTE) Diagnosis of Diabetes The following HbA1c ranges recommended by the American Diabetes Association (ADA) may be used as an aid in the diagnosis of diabetes mellitus.  Hemoglobin             Suggested A1C NGSP%              Diagnosis  <5.7                   Non Diabetic  5.7-6.4                Pre-Diabetic  >6.4                   Diabetic  <7.0                   Glycemic control for                       adults with diabetes.     Mean Plasma Glucose 03/25/2024 102.54  mg/dL Final   Performed at [facility], [address] [place], [place] [address]   Magnesium  03/25/2024 1.9  1.7 - 2.4 mg/dL Final   Performed at [facility], [address], [place], [place] [address]   Alcohol, Ethyl (B) 03/25/2024 <15  <15 mg/dL Final   Comment: (NOTE) For medical purposes  only. Performed at [facility], [address] [place], [place] [address]    Cholesterol 03/25/2024 180  0 - 200 mg/dL Final   Triglycerides 90/87/7974 171 (H)  <150 mg/dL Final   HDL 90/87/7974 45  >40 mg/dL Final   Total CHOL/HDL Ratio 03/25/2024 4.0  RATIO Final   VLDL 03/25/2024 34  0 - 40 mg/dL Final   LDL Cholesterol 03/25/2024 101 (H)  0 - 99 mg/dL Final   Comment:        Total Cholesterol/HDL:CHD Risk Coronary Heart Disease Risk Table                     Men   Women  1/2 Average Risk   3.4   3.3  Average Risk       5.0   4.4  2 X Average Risk   9.6   7.1  3 X Average Risk  23.4   11.0        Use the calculated Patient Ratio above and the CHD Risk Table to determine the patient's CHD Risk.        ATP III CLASSIFICATION (LDL):  <100     mg/dL   Optimal  899-870  mg/dL   Near or Above                    Optimal  130-159  mg/dL   Borderline  839-810  mg/dL   High  >809     mg/dL   Very High Performed at [facility], [address], [place], [place] [address]    TSH 03/25/2024 1.365  0.350 - 4.500 uIU/mL Final   Comment: Performed by a 3rd Generation assay with a functional sensitivity of <=0.01 uIU/mL. Performed at [facility], [address] [place], [place] [address]    POC Amphetamine UR 03/25/2024 None Detected  NONE DETECTED (Cut Off Level 1000 ng/mL) Final   POC Secobarbital (BAR) 03/25/2024 None Detected  NONE DETECTED (Cut Off Level 300 ng/mL) Final   POC Buprenorphine (BUP) 03/25/2024 None Detected  NONE DETECTED (Cut Off Level 10 ng/mL) Final   POC Oxazepam (BZO) 03/25/2024 None Detected  NONE DETECTED (Cut Off Level 300 ng/mL) Final   POC Cocaine UR 03/25/2024 Positive (A)  NONE DETECTED (Cut Off Level 300 ng/mL) Final   POC Methamphetamine UR 03/25/2024 None Detected  NONE DETECTED (Cut Off Level 1000 ng/mL) Final   POC Morphine 03/25/2024 None Detected  NONE DETECTED (Cut Off Level 300 ng/mL) Final   POC Methadone UR 03/25/2024 None  Detected  NONE DETECTED (Cut Off Level 300 ng/mL) Final   POC Oxycodone UR 03/25/2024 None Detected  NONE DETECTED (Cut Off Level 100 ng/mL) Final   POC Marijuana UR 03/25/2024 None Detected  NONE DETECTED (Cut Off Level 50 ng/mL) Final  Office Visit on 03/09/2024  Component Date Value Ref Range Status   Neisseria Gonorrhea 03/09/2024 Negative   Final   Chlamydia 03/09/2024 Negative   Final   Trichomonas 03/09/2024 Negative   Final   Comment 03/09/2024 Normal Reference Range Trichomonas - Negative   Final   Comment 03/09/2024 Normal Reference Ranger Chlamydia - Negative   Final   Comment 03/09/2024 Normal Reference Range Neisseria Gonorrhea - Negative   Final   HIV Screen 4th Generation wRfx 03/09/2024 Non Reactive  Non Reactive Final   Comment: HIV-1/HIV-2 antibodies and HIV-1 p24 antigen were NOT detected. There is no laboratory evidence of HIV infection. HIV Negative    RPR Ser Ql 03/09/2024 Non Reactive  Non Reactive Final  Admission on 10/02/2023, Discharged on 10/02/2023  Component Date Value Ref Range Status   Influenza A Antigen, POC 10/02/2023 Negative  Negative Final   Influenza B Antigen, POC 10/02/2023 Negative  Negative Final   Covid Antigen, POC 10/02/2023 Positive (A)  Negative Final    Blood Alcohol level:  Lab Results  Component Value Date   [facility] <15 03/25/2024   ETH <10 08/04/2023    Metabolic Disorder Labs: Lab Results  Component Value Date   HGBA1C 5.2 03/25/2024   MPG 102.54 03/25/2024   MPG 111 03/27/2023   No results found for: PROLACTIN Lab Results  Component Value Date   CHOL 180 03/25/2024   TRIG 171 (H) 03/25/2024   HDL 45 03/25/2024   CHOLHDL 4.0 03/25/2024   VLDL 34 03/25/2024   LDLCALC 101 (H) 03/25/2024   LDLCALC 118 (  H) 03/27/2023    Therapeutic Lab Levels: No results found for: LITHIUM No results found for: VALPROATE No results found for: CBMZ  Physical Findings   AUDIT    Flowsheet Row ED from 08/04/2023 in [facility]  Alcohol Use Disorder Identification Test Final Score (AUDIT) 24   PHQ2-9    Flowsheet Row ED from 03/25/2024 in [facility] Most recent reading at 03/25/2024  4:32 PM ED from 08/04/2023 in [facility] Most recent reading at 08/07/2023  8:11 AM ED from 03/27/2023 in [facility] Most recent reading at 03/31/2023 10:18 AM ED from 03/27/2023 in [facility] Most recent reading at 03/27/2023 11:04 AM  PHQ-2 Total Score 4 0 3 3  PHQ-9 Total Score 14 -- 12 13   Flowsheet Row ED from 03/25/2024 in [facility] Most recent reading at 03/25/2024  2:54 PM ED from 03/25/2024 in [facility] Most recent reading at 03/25/2024 11:43 AM UC from 10/02/2023 in [facility] [place] Most recent reading at 10/02/2023  5:14 PM  C-SSRS RISK CATEGORY No Risk Low Risk No Risk     Musculoskeletal  Strength & Muscle Tone: within normal limits Gait & Station: normal Patient leans: N/A  Psychiatric Specialty Exam  Presentation  General Appearance:  Appropriate for Environment  Eye Contact: Fair  Speech: Clear and Coherent  Speech Volume: Normal  Handedness: -- (not assessed)   Mood and Affect  Mood: Euthymic  Affect: Congruent   Thought Process  Thought Processes: Coherent; Linear  Descriptions of Associations:Intact  Orientation:Full (Time, Place and Person)  Thought Content:Logical  Diagnosis of Schizophrenia or Schizoaffective disorder in past: Yes    Hallucinations:Hallucinations: None  Ideas of Reference:None  Suicidal Thoughts:Suicidal Thoughts: No  Homicidal Thoughts:Homicidal Thoughts: No   Sensorium  Memory: Immediate Fair; Recent Fair; Remote Fair  Judgment: Fair  Insight: Fair   Art therapist  Concentration: Fair  Attention  Span: Fair  Recall: [employer] of Knowledge: Fair  Language: Fair   Psychomotor Activity  Psychomotor Activity: Psychomotor Activity: Normal   Assets  Assets: Communication Skills; Desire for Improvement; Leisure Time; Physical Health   Sleep  Sleep: Sleep: Fair  Estimated Sleeping Duration (Last 24 Hours): 8.00-10.75 hours  Nutritional Assessment (For OBS and [facility] admissions only) Has the patient had a weight loss or gain of 10 pounds or more in the last 3 months?: No Has the patient had a decrease in food intake/or appetite?: No Does the patient have dental problems?: No Does the patient have eating habits or behaviors that may be indicators of an eating disorder including binging or inducing vomiting?: No Has the patient recently lost weight without trying?: 0 Has the patient been eating poorly because of a decreased appetite?: 0 Malnutrition Screening Tool Score: 0    Physical Exam  Physical Exam Vitals and nursing note reviewed.  Constitutional:      Appearance: Normal appearance.  HENT:     Head: Normocephalic.     Nose: Nose normal.  Eyes:     Extraocular Movements: Extraocular movements intact.  Cardiovascular:     Rate and Rhythm: Normal rate.  Pulmonary:     Effort: Pulmonary effort is normal.  Musculoskeletal:        General: Normal range of motion.     Cervical back: Normal range of motion.  Neurological:     General: No  focal deficit present.     Mental Status: He is alert and oriented to person, place, and time.    Review of Systems  Constitutional: Negative.   HENT: Negative.    Eyes: Negative.   Respiratory: Negative.    Cardiovascular: Negative.   Gastrointestinal: Negative.   Genitourinary: Negative.   Musculoskeletal: Negative.   Neurological: Negative.   Endo/Heme/Allergies: Negative.   Psychiatric/Behavioral:  Positive for substance abuse.    Blood pressure 107/81, pulse 67, temperature 98.6 F (37 C), resp. rate 18,  SpO2 100%. There is no height or weight on file to calculate BMI.  Treatment Plan Summary: Daily contact with patient to assess and evaluate symptoms and progress in treatment and Medication management  Pt admitted to [facility] for alcohol and cocaine use. Requesting residential treatment at [facility] upon discharge. Pt reports difficulty sleeping due to  vivid dreams about substance use but denies having withdrawal symptoms or cravings at this time. He does not want to start Naltrexone  it cause nausea in the past. Discussed increasing Trazodone  from 50mg  to 100mg  to help with sleep. Also discussed possibly switching to Prazosin if Trazodone  is ineffective.   -Increased Trazodone  from 50mg  to 100mg  PRN at bedtime for sleep - Will look at discontinuing CIWA protocol tomorrow if no withdrawal symptoms today. Pt has not required any PRNs for w/d symptoms.   -Labs reviewed showing elevated lipids and UDS positive for cocaine -VS reviewed and stable.  Alan JAYSON Mcardle, NP 03/26/2024 5:29 PM

## 2024-03-26 NOTE — ED Notes (Signed)
 Patient asleep, no CIWA assessment completed at this time.

## 2024-03-26 NOTE — ED Notes (Signed)
 Pt observed lying in bed. Eyes closed respirations even and non labored. NAD q 15 minute observations continue for safety.

## 2024-03-26 NOTE — Group Note (Signed)
 Group Topic: Balance in Life  Group Date: 03/26/2024 Start Time: 1000 End Time: 1030 Facilitators: Carletha Iha, RN  Department: [facility]  Number of Participants: 4  Group Focus: personal responsibility Treatment Modality:  Psychoeducation Interventions utilized were problem solving Purpose: enhance coping skills  Name: Frederick Ballard Date of Birth: [birth year]  MR: [id number]    Level of Participation:  did not attend Quality of Participation:  Interactions with others:  Mood/Affect:  Triggers (if applicable):  Cognition:  Progress:  Response:  Plan:   Patients Problems:  Patient Active Problem List   Diagnosis Date Noted   Alcohol use disorder 03/25/2024   Substance induced mood disorder (HCC) 01/16/2021   Alcohol abuse 01/16/2021   Cocaine use disorder, moderate, in controlled environment (HCC) 01/16/2021   Polysubstance abuse (HCC) 07/24/2011   Schizoaffective disorder, depressive type (HCC) 07/24/2011

## 2024-03-27 DIAGNOSIS — F32A Depression, unspecified: Secondary | ICD-10-CM | POA: Diagnosis not present

## 2024-03-27 DIAGNOSIS — Z556 Problems related to health literacy: Secondary | ICD-10-CM | POA: Diagnosis not present

## 2024-03-27 DIAGNOSIS — F1414 Cocaine abuse with cocaine-induced mood disorder: Secondary | ICD-10-CM | POA: Diagnosis not present

## 2024-03-27 DIAGNOSIS — F259 Schizoaffective disorder, unspecified: Secondary | ICD-10-CM | POA: Diagnosis not present

## 2024-03-27 MED ORDER — NALTREXONE HCL 50 MG PO TABS
25.0000 mg | ORAL_TABLET | Freq: Every day | ORAL | Status: DC
  Administered 2024-03-27 – 2024-03-30 (×4): 25 mg via ORAL
  Filled 2024-03-27 (×3): qty 1
  Filled 2024-03-27: qty 7
  Filled 2024-03-27: qty 1

## 2024-03-27 MED ORDER — TRAZODONE HCL 50 MG PO TABS
50.0000 mg | ORAL_TABLET | Freq: Every day | ORAL | Status: DC
  Administered 2024-03-27 – 2024-03-29 (×3): 50 mg via ORAL
  Filled 2024-03-27: qty 1
  Filled 2024-03-27: qty 14
  Filled 2024-03-27 (×2): qty 1

## 2024-03-27 NOTE — ED Notes (Signed)
 Pt observed lying in bed. Eyes closed respirations even and non labored. NAD q 15 minute observations continue for safety.

## 2024-03-27 NOTE — ED Notes (Signed)
 Pt is asleep in his room, respirations even and unlabored, no objective withdrawal symptom nor distress noted. CIWA will be done in AM when pt is awake. Staff will continue to monitor pt for safety.

## 2024-03-27 NOTE — ED Notes (Signed)
 Patient is in the bedroom sleeping NAD. Will continue to monitor for safety.

## 2024-03-27 NOTE — ED Notes (Addendum)
 Patient is in the bedroom calm and composed sleeping. NAD.  Environment secured per policy. Will monitor for safety.

## 2024-03-27 NOTE — ED Provider Notes (Addendum)
 Behavioral Health Progress Note  Date and Time: 03/27/2024 11:21 AM Name: Frederick Ballard MRN:  [id number]  Subjective:   Frederick Ballard is 38 year old male patient with a documented psychiatric history significant for schizoaffective disorder - depressive type, polysubstance abuse, alcohol use disorder, and cocaine use who was admitted to the [facility] for alcohol and drug use. BAL negative and UDS positive for cocaine.   Upon evaluation today, patient is observed resting in bed, in no acute distress. He reports ongoing dreams about using cocaine and alcohol which is leading to him having cravings and decreased sleep. He continues to deny withdrawal symptoms and has not required any as needed medications.  He has been compliant with scheduled vitamins.  He does not wish to start any scheduled medications for mood as he feels that his depressive symptoms have already improved and describes feeling pretty good.  Pt inquires about possibly starting naltrexone  for the cravings and is aware that he has Zofran  ordered as needed if it does cause nausea, he will let nursing know. Patient has been attending and participating in groups.  Patient is still interested in receiving residential treatment at [facility] and states that he is also open to [facility] as a backup plan if needed. He denies any acute pain or physical complaints.  Patient continues to deny having any suicidal ideations, homicidal ideations, psychotic symptoms.  Patient does not objectively appear to be having any signs of psychosis or complicated withdrawal symptoms.    Diagnosis:  Final diagnoses:  Alcohol use  Cocaine use  Substance induced mood disorder (HCC)    Total Time spent with patient: 20 minutes  Past Psychiatric History: a documented psychiatric history significant for schizoaffective disorder - depressive type, polysubstance abuse, alcohol use disorder, and cocaine use disorder. Per chart review  he was hospitalized at [facility] in January 2013. He reports past substance abuse treatment here at the [facility] in January 2025, and at [facility] in [place] twice in 2019. He has been prescribed in the past Seroquel , Zoloft , Naltrexone  and Trazodone .  Past Medical History: None reported Family Psychiatric  History: Pt reports his father had a drug addiction. Social History: He resides with his older sister and states that his deceased father left her the house. He has 2 children, 35 year old daughter and 59-year-old son. 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 He denies legal issues.   Additional Social History:                         Sleep: Fair  Appetite:  Good  Current Medications:  Current Facility-Administered Medications  Medication Dose Route Frequency Provider Last Rate Last Admin   acetaminophen  (TYLENOL ) tablet 650 mg  650 mg Oral Q6H PRN Cameryn Schum C, NP       alum & mag hydroxide-simeth (MAALOX/MYLANTA) 200-200-20 MG/5ML suspension 30 mL  30 mL Oral Q4H PRN Khamari Yousuf C, NP       haloperidol  (HALDOL ) tablet 5 mg  5 mg Oral TID PRN Tedi Hughson C, NP       And   diphenhydrAMINE  (BENADRYL ) capsule 50 mg  50 mg Oral TID PRN Landon Bassford C, NP       haloperidol  lactate (HALDOL ) injection 5 mg  5 mg Intramuscular TID PRN Thresa Palma C, NP       And   diphenhydrAMINE  (BENADRYL ) injection 50 mg  50 mg Intramuscular TID PRN  Thresa Alan BROCKS, NP       And   LORazepam  (ATIVAN ) injection 2 mg  2 mg Intramuscular TID PRN Niralya Ohanian C, NP       haloperidol  lactate (HALDOL ) injection 10 mg  10 mg Intramuscular TID PRN Kyrese Gartman C, NP       And   diphenhydrAMINE  (BENADRYL ) injection 50 mg  50 mg Intramuscular TID PRN Herberth Deharo C, NP       And   LORazepam  (ATIVAN ) injection 2 mg  2 mg Intramuscular TID PRN Janicia Monterrosa C, NP       hydrOXYzine  (ATARAX ) tablet 25 mg  25 mg Oral Q6H PRN Christon Gallaway C, NP       loperamide   (IMODIUM ) capsule 2-4 mg  2-4 mg Oral PRN Azura Tufaro C, NP       magnesium  hydroxide (MILK OF MAGNESIA) suspension 30 mL  30 mL Oral Daily PRN Aliou Mealey C, NP       multivitamin with minerals tablet 1 tablet  1 tablet Oral Daily Zakyria Metzinger C, NP   1 tablet at 03/26/24 1236   naltrexone  (DEPADE) tablet 25 mg  25 mg Oral Daily Olando Willems C, NP       ondansetron  (ZOFRAN -ODT) disintegrating tablet 4 mg  4 mg Oral Q6H PRN Kemari Mares C, NP       thiamine  (VITAMIN B1) tablet 100 mg  100 mg Oral Daily Brylin Stopper C, NP   100 mg at 03/26/24 1236   traZODone  (DESYREL ) tablet 50 mg  50 mg Oral QHS Thresa Alan BROCKS, NP       No current outpatient medications on file.    Labs  Lab Results:  Admission on 03/25/2024, Discharged on 03/25/2024  Component Date Value Ref Range Status   WBC 03/25/2024 6.9  4.0 - 10.5 K/uL Final   RBC 03/25/2024 4.43  4.22 - 5.81 MIL/uL Final   Hemoglobin 03/25/2024 13.4  13.0 - 17.0 g/dL Final   HCT 90/87/7974 40.8  39.0 - 52.0 % Final   MCV 03/25/2024 92.1  80.0 - 100.0 fL Final   MCH 03/25/2024 30.2  26.0 - 34.0 pg Final   MCHC 03/25/2024 32.8  30.0 - 36.0 g/dL Final   RDW 90/87/7974 13.0  11.5 - 15.5 % Final   Platelets 03/25/2024 306  150 - 400 K/uL Final   nRBC 03/25/2024 0.0  0.0 - 0.2 % Final   Neutrophils Relative % 03/25/2024 36  % Final   Neutro Abs 03/25/2024 2.5  1.7 - 7.7 K/uL Final   Lymphocytes Relative 03/25/2024 48  % Final   Lymphs Abs 03/25/2024 3.3  0.7 - 4.0 K/uL Final   Monocytes Relative 03/25/2024 12  % Final   Monocytes Absolute 03/25/2024 0.8  0.1 - 1.0 K/uL Final   Eosinophils Relative 03/25/2024 3  % Final   Eosinophils Absolute 03/25/2024 0.2  0.0 - 0.5 K/uL Final   Basophils Relative 03/25/2024 1  % Final   Basophils Absolute 03/25/2024 0.1  0.0 - 0.1 K/uL Final   Immature Granulocytes 03/25/2024 0  % Final   Abs Immature Granulocytes 03/25/2024 0.02  0.00 - 0.07 K/uL Final   Performed at [facility], [address], [place], [place] [address]   Sodium 03/25/2024 137  135 - 145 mmol/L Final   Potassium 03/25/2024 4.3  3.5 - 5.1 mmol/L Final   Chloride 03/25/2024 103  98 - 111 mmol/L Final   CO2 03/25/2024 24  22 - 32 mmol/L Final   Glucose, Bld 03/25/2024 87  70 - 99 mg/dL Final   Glucose reference range applies only to samples taken after fasting for at least 8 hours.   BUN 03/25/2024 12  6 - 20 mg/dL Final   Creatinine, Ser 03/25/2024 1.12  0.61 - 1.24 mg/dL Final   Calcium 90/87/7974 9.5  8.9 - 10.3 mg/dL Final   Total Protein 90/87/7974 6.6  6.5 - 8.1 g/dL Final   Albumin 90/87/7974 3.8  3.5 - 5.0 g/dL Final   AST 90/87/7974 24  15 - 41 U/L Final   ALT 03/25/2024 25  0 - 44 U/L Final   Alkaline Phosphatase 03/25/2024 80  38 - 126 U/L Final   Total Bilirubin 03/25/2024 0.5  0.0 - 1.2 mg/dL Final   GFR, Estimated 03/25/2024 >60  >60 mL/min Final   Comment: (NOTE) Calculated using the CKD-EPI Creatinine Equation (2021)    Anion gap 03/25/2024 10  5 - 15 Final   Performed at [facility], [address], [place], [place] [address]   Hgb A1c MFr Bld 03/25/2024 5.2  4.8 - 5.6 % Final   Comment: (NOTE) Diagnosis of Diabetes The following HbA1c ranges recommended by the American Diabetes Association (ADA) may be used as an aid in the diagnosis of diabetes mellitus.  Hemoglobin             Suggested A1C NGSP%              Diagnosis  <5.7                   Non Diabetic  5.7-6.4                Pre-Diabetic  >6.4                   Diabetic  <7.0                   Glycemic control for                       adults with diabetes.     Mean Plasma Glucose 03/25/2024 102.54  mg/dL Final   Performed at [facility], [address] [place], [place] [address]   Magnesium  03/25/2024 1.9  1.7 - 2.4 mg/dL Final   Performed at [facility], [address] [place], [place] [address]   Alcohol, Ethyl (B) 03/25/2024 <15  <15 mg/dL Final   Comment: (NOTE) For medical purposes  only. Performed at [facility], [address], [place], [place] [address]    Cholesterol 03/25/2024 180  0 - 200 mg/dL Final   Triglycerides 90/87/7974 171 (H)  <150 mg/dL Final   HDL 90/87/7974 45  >40 mg/dL Final   Total CHOL/HDL Ratio 03/25/2024 4.0  RATIO Final   VLDL 03/25/2024 34  0 - 40 mg/dL Final   LDL Cholesterol 03/25/2024 101 (H)  0 - 99 mg/dL Final   Comment:        Total Cholesterol/HDL:CHD Risk Coronary Heart Disease Risk Table                     Men   Women  1/2 Average Risk   3.4   3.3  Average Risk       5.0   4.4  2 X Average Risk   9.6   7.1  3 X Average  Risk  23.4   11.0        Use the calculated Patient Ratio above and the CHD Risk Table to determine the patient's CHD Risk.        ATP III CLASSIFICATION (LDL):  <100     mg/dL   Optimal  899-870  mg/dL   Near or Above                    Optimal  130-159  mg/dL   Borderline  839-810  mg/dL   High  >809     mg/dL   Very High Performed at [facility], [address], [place], [place] [address]    TSH 03/25/2024 1.365  0.350 - 4.500 uIU/mL Final   Comment: Performed by a 3rd Generation assay with a functional sensitivity of <=0.01 uIU/mL. Performed at [facility], [address], [place], [place] [address]    POC Amphetamine UR 03/25/2024 None Detected  NONE DETECTED (Cut Off Level 1000 ng/mL) Final   POC Secobarbital (BAR) 03/25/2024 None Detected  NONE DETECTED (Cut Off Level 300 ng/mL) Final   POC Buprenorphine (BUP) 03/25/2024 None Detected  NONE DETECTED (Cut Off Level 10 ng/mL) Final   POC Oxazepam (BZO) 03/25/2024 None Detected  NONE DETECTED (Cut Off Level 300 ng/mL) Final   POC Cocaine UR 03/25/2024 Positive (A)  NONE DETECTED (Cut Off Level 300 ng/mL) Final   POC Methamphetamine UR 03/25/2024 None Detected  NONE DETECTED (Cut Off Level 1000 ng/mL) Final   POC Morphine 03/25/2024 None Detected  NONE DETECTED (Cut Off Level 300 ng/mL) Final   POC Methadone UR 03/25/2024 None  Detected  NONE DETECTED (Cut Off Level 300 ng/mL) Final   POC Oxycodone UR 03/25/2024 None Detected  NONE DETECTED (Cut Off Level 100 ng/mL) Final   POC Marijuana UR 03/25/2024 None Detected  NONE DETECTED (Cut Off Level 50 ng/mL) Final  Office Visit on 03/09/2024  Component Date Value Ref Range Status   Neisseria Gonorrhea 03/09/2024 Negative   Final   Chlamydia 03/09/2024 Negative   Final   Trichomonas 03/09/2024 Negative   Final   Comment 03/09/2024 Normal Reference Range Trichomonas - Negative   Final   Comment 03/09/2024 Normal Reference Ranger Chlamydia - Negative   Final   Comment 03/09/2024 Normal Reference Range Neisseria Gonorrhea - Negative   Final   HIV Screen 4th Generation wRfx 03/09/2024 Non Reactive  Non Reactive Final   Comment: HIV-1/HIV-2 antibodies and HIV-1 p24 antigen were NOT detected. There is no laboratory evidence of HIV infection. HIV Negative    RPR Ser Ql 03/09/2024 Non Reactive  Non Reactive Final  Admission on 10/02/2023, Discharged on 10/02/2023  Component Date Value Ref Range Status   Influenza A Antigen, POC 10/02/2023 Negative  Negative Final   Influenza B Antigen, POC 10/02/2023 Negative  Negative Final   Covid Antigen, POC 10/02/2023 Positive (A)  Negative Final    Blood Alcohol level:  Lab Results  Component Value Date   [facility] <15 03/25/2024   ETH <10 08/04/2023    Metabolic Disorder Labs: Lab Results  Component Value Date   HGBA1C 5.2 03/25/2024   MPG 102.54 03/25/2024   MPG 111 03/27/2023   No results found for: PROLACTIN Lab Results  Component Value Date   CHOL 180 03/25/2024   TRIG 171 (H) 03/25/2024   HDL 45 03/25/2024   CHOLHDL 4.0 03/25/2024   VLDL 34 03/25/2024   LDLCALC 101 (H) 03/25/2024  LDLCALC 118 (H) 03/27/2023    Therapeutic Lab Levels: No results found for: LITHIUM No results found for: VALPROATE No results found for: CBMZ  Physical Findings   AUDIT    Flowsheet Row ED from 08/04/2023 in [facility]  Alcohol Use Disorder Identification Test Final Score (AUDIT) 24   PHQ2-9    Flowsheet Row ED from 03/25/2024 in [facility] Most recent reading at 03/25/2024  4:32 PM ED from 08/04/2023 in [facility] Most recent reading at 08/07/2023  8:11 AM ED from 03/27/2023 in [facility] Most recent reading at 03/31/2023 10:18 AM ED from 03/27/2023 in [facility] Most recent reading at 03/27/2023 11:04 AM  PHQ-2 Total Score 4 0 3 3  PHQ-9 Total Score 14 -- 12 13   Flowsheet Row ED from 03/25/2024 in [facility] Most recent reading at 03/25/2024  2:54 PM ED from 03/25/2024 in [facility] Most recent reading at 03/25/2024 11:43 AM UC from 10/02/2023 in [facility] Most recent reading at 10/02/2023  5:14 PM  C-SSRS RISK CATEGORY No Risk Low Risk No Risk     Musculoskeletal  Strength & Muscle Tone: within normal limits Gait & Station: normal Patient leans: N/A  Psychiatric Specialty Exam  Presentation  General Appearance:  Appropriate for Environment  Eye Contact: Fair  Speech: Clear and Coherent  Speech Volume: Normal  Handedness: -- (not assessed)   Mood and Affect  Mood: Euthymic  Affect: Congruent   Thought Process  Thought Processes: Coherent; Linear  Descriptions of Associations:Intact  Orientation:Full (Time, Place and Person)  Thought Content:Logical  Diagnosis of Schizophrenia or Schizoaffective disorder in past: Yes    Hallucinations:No data recorded  Ideas of Reference:None  Suicidal Thoughts:No data recorded  Homicidal Thoughts:No data recorded   Sensorium  Memory: Immediate Fair; Recent Fair; Remote Fair  Judgment: Fair  Insight: Fair   Chartered certified accountant: Fair  Attention  Span: Fair  Recall: [employer] of Knowledge: Fair  Language: Fair   Psychomotor Activity  Psychomotor Activity: No data recorded   Assets  Assets: Communication Skills; Desire for Improvement; Leisure Time; Physical Health   Sleep  Sleep: No data recorded  Estimated Sleeping Duration (Last 24 Hours): 9.25-10.75 hours  No data recorded   Physical Exam  Physical Exam Vitals and nursing note reviewed.  Constitutional:      Appearance: Normal appearance.  HENT:     Head: Normocephalic.     Nose: Nose normal.  Eyes:     Extraocular Movements: Extraocular movements intact.  Cardiovascular:     Rate and Rhythm: Normal rate.  Pulmonary:     Effort: Pulmonary effort is normal.  Musculoskeletal:        General: Normal range of motion.     Cervical back: Normal range of motion.  Neurological:     General: No focal deficit present.     Mental Status: He is alert and oriented to person, place, and time.    Review of Systems  Constitutional: Negative.   HENT: Negative.    Eyes: Negative.   Respiratory: Negative.    Cardiovascular: Negative.   Gastrointestinal: Negative.   Genitourinary: Negative.   Musculoskeletal: Negative.   Neurological: Negative.   Endo/Heme/Allergies: Negative.   Psychiatric/Behavioral:  Positive for substance abuse.    Blood pressure 107/67, pulse 65, temperature 97.8 F (36.6 C), resp.  rate 17, SpO2 98%. There is no height or weight on file to calculate BMI.  Treatment Plan Summary: Daily contact with patient to assess and evaluate symptoms and progress in treatment and Medication management  Pt admitted to [facility] for alcohol and cocaine use. Requesting residential treatment upon discharge. Pt reports difficulty sleeping due to  vivid dreams about substance use , causing to now experience cravings. He is open to trial Naltrexone  and scheduled Trazodone  for sleep and cravings.    - Discontinued CIWAs and PRN Ativan  as pt has not  displayed any withdrawal symptoms since admission.  - Start Trazodone  50mg  at bedtime, scheduled as pt did not request the PRN trazodone  last night but continues to have difficulty sleeping.  - Start naltrexone  25 mg daily for alcohol craving -Social work to follow-up with coordinating residential treatment tomorrow.   -Labs reviewed showing elevated lipids and UDS positive for cocaine -LFTs are within normal limits -VS reviewed and stable.  Alan JAYSON Mcardle, NP 03/27/2024 11:21 AM

## 2024-03-27 NOTE — Group Note (Signed)
 Group Topic: Emotional Regulation  Group Date: 03/27/2024 Start Time: 2000 End Time: 2100 Facilitators: Luvenia Mae SAUNDERS, NT  Department: [facility]  Number of Participants: 9  Group Focus: self-awareness Treatment Modality:  Leisure Development Interventions utilized were assignment Purpose: express feelings  Name: Frederick Ballard Date of Birth: [birth year]  MR: [id number]    Level of Participation: active Quality of Participation: attentive and cooperative Interactions with others: gave feedback Mood/Affect: appropriate Triggers (if applicable): none Cognition: coherent/clear Progress: Gaining insight Response: NA Plan: patient will be encouraged to go to group  Patients Problems:  Patient Active Problem List   Diagnosis Date Noted   Alcohol use disorder 03/25/2024   Substance induced mood disorder (HCC) 01/16/2021   Alcohol abuse 01/16/2021   Cocaine use disorder, moderate, in controlled environment (HCC) 01/16/2021   Polysubstance abuse (HCC) 07/24/2011   Schizoaffective disorder, depressive type (HCC) 07/24/2011

## 2024-03-28 DIAGNOSIS — F1414 Cocaine abuse with cocaine-induced mood disorder: Secondary | ICD-10-CM | POA: Diagnosis not present

## 2024-03-28 DIAGNOSIS — F32A Depression, unspecified: Secondary | ICD-10-CM | POA: Diagnosis not present

## 2024-03-28 DIAGNOSIS — Z556 Problems related to health literacy: Secondary | ICD-10-CM | POA: Diagnosis not present

## 2024-03-28 DIAGNOSIS — F259 Schizoaffective disorder, unspecified: Secondary | ICD-10-CM | POA: Diagnosis not present

## 2024-03-28 NOTE — Group Note (Signed)
 Group Topic: Change and Accountability  Group Date: 03/28/2024 Start Time: 1930 End Time: 2000 Facilitators: Anice Benton LABOR, NT  Department: [facility]  Number of Participants: 6  Group Focus: clarity of thought, feeling awareness/expression, and self-awareness Treatment Modality:  Individual Therapy and Psychoeducation Interventions utilized were assignment Purpose: enhance coping skills, explore maladaptive thinking, and express feelings  Name: EBRAHIM DEREMER Date of Birth: [birth year]  MR: [id number]    Level of Participation: active Quality of Participation: attentive and cooperative Interactions with others: gave feedback Mood/Affect: appropriate Triggers (if applicable): N/A Cognition: coherent/clear Progress: Moderate Response: N/A Plan: follow-up needed  Patients Problems:  Patient Active Problem List   Diagnosis Date Noted   Alcohol use disorder 03/25/2024   Substance induced mood disorder (HCC) 01/16/2021   Alcohol abuse 01/16/2021   Cocaine use disorder, moderate, in controlled environment (HCC) 01/16/2021   Polysubstance abuse (HCC) 07/24/2011   Schizoaffective disorder, depressive type (HCC) 07/24/2011

## 2024-03-28 NOTE — ED Notes (Signed)
 Patient A&Ox4. Denies intent to harm self/others when asked. Denies A/VH. Patient denies any physical complaints when asked. No acute distress noted. Support and encouragement provided. Routine safety checks conducted according to facility protocol. Encouraged patient to notify staff if thoughts of harm toward self or others arise. Patient verbalize understanding and agreement. Will continue to monitor for safety.

## 2024-03-28 NOTE — ED Notes (Signed)
 Pt actively participating in group. No acute distress noted. No concerns voiced. Informed pt to notify staff with any needs or assistance. Pt verbalized understanding and agreement. Will continue to monitor for safety.

## 2024-03-28 NOTE — ED Notes (Signed)
 Patient asleep.

## 2024-03-28 NOTE — Care Management (Signed)
 [facility] Care Management   Writer faxed referral to [facility], [facility] and [facility].

## 2024-03-28 NOTE — Group Note (Signed)
 Group Topic: Change and Accountability  Group Date: 03/28/2024 Start Time: 1000 End Time: 1100 Facilitators: Alyse Leilani LABOR, NT  Department: [facility]  Number of Participants: 8  Group Focus: abuse issues, acceptance, and chemical dependency education Treatment Modality:  Interpersonal Therapy Interventions utilized were group exercise Purpose: enhance coping skills, express feelings, increase insight, and relapse prevention strategies  Name: Frederick Ballard Date of Birth: 1985/07/16  MR: [id number]    Level of Participation: active Quality of Participation: cooperative and engaged Interactions with others: gave feedback Mood/Affect: bright and positive Triggers (if applicable): none Cognition: coherent/clear and insightful Progress: Gaining insight Response: none Plan: patient will be encouraged to keep attending groups after discharger.   Patients Problems:  Patient Active Problem List   Diagnosis Date Noted   Alcohol use disorder 03/25/2024   Substance induced mood disorder (HCC) 01/16/2021   Alcohol abuse 01/16/2021   Cocaine use disorder, moderate, in controlled environment (HCC) 01/16/2021   Polysubstance abuse (HCC) 07/24/2011   Schizoaffective disorder, depressive type (HCC) 07/24/2011

## 2024-03-28 NOTE — ED Provider Notes (Signed)
 Behavioral Health Progress Note  Date and Time: 03/28/2024 2:22 PM Name: Frederick Ballard MRN:  [id number]  Subjective:  Frederick Ballard was seen in the common area on rounds. He feels that he is doing alright overall. He is able to sleep and eat regularly  Diagnosis:  Final diagnoses:  Alcohol use  Cocaine use  Substance induced mood disorder (HCC)    Total Time spent with patient: 15 minutes  Past Psychiatric History: a documented psychiatric history significant for schizoaffective disorder - depressive type, polysubstance abuse, alcohol use disorder, and cocaine use disorder. Per chart review he was hospitalized at [facility] in January 2013. He reports past substance abuse treatment here at the [facility] in January 2025, and at [facility] in [place] twice in 2019. He has been prescribed in the past Seroquel , Zoloft , Naltrexone  and Trazodone .  Past Medical History: None reported Family Psychiatric  History: Pt reports his father had a drug addiction. Social History: He resides with his older sister and states that his deceased father left her the house. He has 2 children, 71 year old daughter and 66-year-old son. 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 He denies legal issues.   Sleep: Good  Appetite:  Good  Current Medications:  Current Facility-Administered Medications  Medication Dose Route Frequency Provider Last Rate Last Admin   acetaminophen  (TYLENOL ) tablet 650 mg  650 mg Oral Q6H PRN Brent, Amanda C, NP       alum & mag hydroxide-simeth (MAALOX/MYLANTA) 200-200-20 MG/5ML suspension 30 mL  30 mL Oral Q4H PRN Brent, Amanda C, NP       haloperidol  (HALDOL ) tablet 5 mg  5 mg Oral TID PRN Brent, Amanda C, NP       And   diphenhydrAMINE  (BENADRYL ) capsule 50 mg  50 mg Oral TID PRN Brent, Amanda C, NP       haloperidol  lactate (HALDOL ) injection 5 mg  5 mg Intramuscular TID PRN Brent, Amanda C, NP       And   diphenhydrAMINE  (BENADRYL ) injection 50 mg  50 mg  Intramuscular TID PRN Brent, Amanda C, NP       And   LORazepam  (ATIVAN ) injection 2 mg  2 mg Intramuscular TID PRN Brent, Amanda C, NP       haloperidol  lactate (HALDOL ) injection 10 mg  10 mg Intramuscular TID PRN Thresa Alan BROCKS, NP       And   diphenhydrAMINE  (BENADRYL ) injection 50 mg  50 mg Intramuscular TID PRN Brent, Amanda C, NP       And   LORazepam  (ATIVAN ) injection 2 mg  2 mg Intramuscular TID PRN Brent, Amanda C, NP       hydrOXYzine  (ATARAX ) tablet 25 mg  25 mg Oral Q6H PRN Brent, Amanda C, NP       loperamide  (IMODIUM ) capsule 2-4 mg  2-4 mg Oral PRN Brent, Amanda C, NP       magnesium  hydroxide (MILK OF MAGNESIA) suspension 30 mL  30 mL Oral Daily PRN Brent, Amanda C, NP       multivitamin with minerals tablet 1 tablet  1 tablet Oral Daily Brent, Amanda C, NP   1 tablet at 03/28/24 0940   naltrexone  (DEPADE) tablet 25 mg  25 mg Oral Daily Brent, Amanda C, NP   25 mg at 03/28/24 0940   ondansetron  (ZOFRAN -ODT) disintegrating tablet 4 mg  4 mg Oral Q6H PRN Brent, Amanda C, NP       thiamine  (VITAMIN  B1) tablet 100 mg  100 mg Oral Daily Brent, Amanda C, NP   100 mg at 03/28/24 0940   traZODone  (DESYREL ) tablet 50 mg  50 mg Oral QHS Brent, Amanda C, NP   50 mg at 03/27/24 2144   No current outpatient medications on file.    Labs  Lab Results:  Admission on 03/25/2024, Discharged on 03/25/2024  Component Date Value Ref Range Status   WBC 03/25/2024 6.9  4.0 - 10.5 K/uL Final   RBC 03/25/2024 4.43  4.22 - 5.81 MIL/uL Final   Hemoglobin 03/25/2024 13.4  13.0 - 17.0 g/dL Final   HCT 90/87/7974 40.8  39.0 - 52.0 % Final   MCV 03/25/2024 92.1  80.0 - 100.0 fL Final   MCH 03/25/2024 30.2  26.0 - 34.0 pg Final   MCHC 03/25/2024 32.8  30.0 - 36.0 g/dL Final   RDW 90/87/7974 13.0  11.5 - 15.5 % Final   Platelets 03/25/2024 306  150 - 400 K/uL Final   nRBC 03/25/2024 0.0  0.0 - 0.2 % Final   Neutrophils Relative % 03/25/2024 36  % Final   Neutro Abs 03/25/2024 2.5  1.7 - 7.7 K/uL  Final   Lymphocytes Relative 03/25/2024 48  % Final   Lymphs Abs 03/25/2024 3.3  0.7 - 4.0 K/uL Final   Monocytes Relative 03/25/2024 12  % Final   Monocytes Absolute 03/25/2024 0.8  0.1 - 1.0 K/uL Final   Eosinophils Relative 03/25/2024 3  % Final   Eosinophils Absolute 03/25/2024 0.2  0.0 - 0.5 K/uL Final   Basophils Relative 03/25/2024 1  % Final   Basophils Absolute 03/25/2024 0.1  0.0 - 0.1 K/uL Final   Immature Granulocytes 03/25/2024 0  % Final   Abs Immature Granulocytes 03/25/2024 0.02  0.00 - 0.07 K/uL Final   Performed at [facility], [address] [place], [place] [address]   Sodium 03/25/2024 137  135 - 145 mmol/L Final   Potassium 03/25/2024 4.3  3.5 - 5.1 mmol/L Final   Chloride 03/25/2024 103  98 - 111 mmol/L Final   CO2 03/25/2024 24  22 - 32 mmol/L Final   Glucose, Bld 03/25/2024 87  70 - 99 mg/dL Final   Glucose reference range applies only to samples taken after fasting for at least 8 hours.   BUN 03/25/2024 12  6 - 20 mg/dL Final   Creatinine, Ser 03/25/2024 1.12  0.61 - 1.24 mg/dL Final   Calcium 90/87/7974 9.5  8.9 - 10.3 mg/dL Final   Total Protein 90/87/7974 6.6  6.5 - 8.1 g/dL Final   Albumin 90/87/7974 3.8  3.5 - 5.0 g/dL Final   AST 90/87/7974 24  15 - 41 U/L Final   ALT 03/25/2024 25  0 - 44 U/L Final   Alkaline Phosphatase 03/25/2024 80  38 - 126 U/L Final   Total Bilirubin 03/25/2024 0.5  0.0 - 1.2 mg/dL Final   GFR, Estimated 03/25/2024 >60  >60 mL/min Final   Comment: (NOTE) Calculated using the CKD-EPI Creatinine Equation (2021)    Anion gap 03/25/2024 10  5 - 15 Final   Performed at [facility], [address], [place], [place] [address]   Hgb A1c MFr Bld 03/25/2024 5.2  4.8 - 5.6 % Final   Comment: (NOTE) Diagnosis of Diabetes The following HbA1c ranges recommended by the American Diabetes Association (ADA) may be used as an aid in the diagnosis of diabetes mellitus.  Hemoglobin  Suggested A1C NGSP%               Diagnosis  <5.7                   Non Diabetic  5.7-6.4                Pre-Diabetic  >6.4                   Diabetic  <7.0                   Glycemic control for                       adults with diabetes.     Mean Plasma Glucose 03/25/2024 102.54  mg/dL Final   Performed at [facility], [address] [place], [place] [address]   Magnesium  03/25/2024 1.9  1.7 - 2.4 mg/dL Final   Performed at [facility], [address], [place], [place] [address]   Alcohol, Ethyl (B) 03/25/2024 <15  <15 mg/dL Final   Comment: (NOTE) For medical purposes only. Performed at [facility], [address], [place], [place] [address]    Cholesterol 03/25/2024 180  0 - 200 mg/dL Final   Triglycerides 90/87/7974 171 (H)  <150 mg/dL Final   HDL 90/87/7974 45  >40 mg/dL Final   Total CHOL/HDL Ratio 03/25/2024 4.0  RATIO Final   VLDL 03/25/2024 34  0 - 40 mg/dL Final   LDL Cholesterol 03/25/2024 101 (H)  0 - 99 mg/dL Final   Comment:        Total Cholesterol/HDL:CHD Risk Coronary Heart Disease Risk Table                     Men   Women  1/2 Average Risk   3.4   3.3  Average Risk       5.0   4.4  2 X Average Risk   9.6   7.1  3 X Average Risk  23.4   11.0        Use the calculated Patient Ratio above and the CHD Risk Table to determine the patient's CHD Risk.        ATP III CLASSIFICATION (LDL):  <100     mg/dL   Optimal  899-870  mg/dL   Near or Above                    Optimal  130-159  mg/dL   Borderline  839-810  mg/dL   High  >809     mg/dL   Very High Performed at [facility], [address], [place], [place] [address]    TSH 03/25/2024 1.365  0.350 - 4.500 uIU/mL Final   Comment: Performed by a 3rd Generation assay with a functional sensitivity of <=0.01 uIU/mL. Performed at [facility], [address] [place], [place] [address]    POC Amphetamine UR 03/25/2024 None Detected  NONE DETECTED (Cut Off Level 1000 ng/mL) Final   POC Secobarbital (BAR)  03/25/2024 None Detected  NONE DETECTED (Cut Off Level 300 ng/mL) Final   POC Buprenorphine (BUP) 03/25/2024 None Detected  NONE DETECTED (Cut Off Level 10 ng/mL) Final   POC Oxazepam (BZO) 03/25/2024 None Detected  NONE DETECTED (Cut Off Level 300 ng/mL) Final   POC Cocaine UR 03/25/2024 Positive (A)  NONE DETECTED (Cut Off Level  300 ng/mL) Final   POC Methamphetamine UR 03/25/2024 None Detected  NONE DETECTED (Cut Off Level 1000 ng/mL) Final   POC Morphine 03/25/2024 None Detected  NONE DETECTED (Cut Off Level 300 ng/mL) Final   POC Methadone UR 03/25/2024 None Detected  NONE DETECTED (Cut Off Level 300 ng/mL) Final   POC Oxycodone UR 03/25/2024 None Detected  NONE DETECTED (Cut Off Level 100 ng/mL) Final   POC Marijuana UR 03/25/2024 None Detected  NONE DETECTED (Cut Off Level 50 ng/mL) Final  Office Visit on 03/09/2024  Component Date Value Ref Range Status   Neisseria Gonorrhea 03/09/2024 Negative   Final   Chlamydia 03/09/2024 Negative   Final   Trichomonas 03/09/2024 Negative   Final   Comment 03/09/2024 Normal Reference Range Trichomonas - Negative   Final   Comment 03/09/2024 Normal Reference Ranger Chlamydia - Negative   Final   Comment 03/09/2024 Normal Reference Range Neisseria Gonorrhea - Negative   Final   HIV Screen 4th Generation wRfx 03/09/2024 Non Reactive  Non Reactive Final   Comment: HIV-1/HIV-2 antibodies and HIV-1 p24 antigen were NOT detected. There is no laboratory evidence of HIV infection. HIV Negative    RPR Ser Ql 03/09/2024 Non Reactive  Non Reactive Final  Admission on 10/02/2023, Discharged on 10/02/2023  Component Date Value Ref Range Status   Influenza A Antigen, POC 10/02/2023 Negative  Negative Final   Influenza B Antigen, POC 10/02/2023 Negative  Negative Final   Covid Antigen, POC 10/02/2023 Positive (A)  Negative Final    Blood Alcohol level:  Lab Results  Component Value Date   [facility] <15 03/25/2024   ETH <10 08/04/2023    Metabolic Disorder  Labs: Lab Results  Component Value Date   HGBA1C 5.2 03/25/2024   MPG 102.54 03/25/2024   MPG 111 03/27/2023   No results found for: PROLACTIN Lab Results  Component Value Date   CHOL 180 03/25/2024   TRIG 171 (H) 03/25/2024   HDL 45 03/25/2024   CHOLHDL 4.0 03/25/2024   VLDL 34 03/25/2024   LDLCALC 101 (H) 03/25/2024   LDLCALC 118 (H) 03/27/2023    Therapeutic Lab Levels: No results found for: LITHIUM No results found for: VALPROATE No results found for: CBMZ  Physical Findings   AUDIT    Flowsheet Row ED from 08/04/2023 in [facility]  Alcohol Use Disorder Identification Test Final Score (AUDIT) 24   PHQ2-9    Flowsheet Row ED from 03/25/2024 in [facility] Most recent reading at 03/25/2024  4:32 PM ED from 08/04/2023 in [facility] Most recent reading at 08/07/2023  8:11 AM ED from 03/27/2023 in [facility] Most recent reading at 03/31/2023 10:18 AM ED from 03/27/2023 in [facility] Most recent reading at 03/27/2023 11:04 AM  PHQ-2 Total Score 4 0 3 3  PHQ-9 Total Score 14 -- 12 13   Flowsheet Row ED from 03/25/2024 in [facility] Most recent reading at 03/25/2024  2:54 PM ED from 03/25/2024 in [facility] Most recent reading at 03/25/2024 11:43 AM UC from 10/02/2023 in [facility] [place] Most recent reading at 10/02/2023  5:14 PM  C-SSRS RISK CATEGORY No Risk Low Risk No Risk     Musculoskeletal  Strength & Muscle Tone: within normal limits Gait & Station: normal Patient leans: N/A  Psychiatric Specialty Exam  Presentation  General Appearance:  Appropriate for  Environment  Eye Contact: Fair  Speech: Clear and Coherent  Speech Volume: Normal  Handedness: -- (not assessed)   Mood and Affect   Mood: Euthymic  Affect: Congruent   Thought Process  Thought Processes: Coherent; Linear  Descriptions of Associations:Intact  Orientation:Full (Time, Place and Person)  Thought Content:Logical  Diagnosis of Schizophrenia or Schizoaffective disorder in past: Yes    Hallucinations:No data recorded Ideas of Reference:None  Suicidal Thoughts:No data recorded Homicidal Thoughts:No data recorded  Sensorium  Memory: Immediate Fair; Recent Fair; Remote Fair  Judgment: Fair  Insight: Fair   Art therapist  Concentration: Fair  Attention Span: Fair  Recall: [employer] of Knowledge: Fair  Language: Fair   Psychomotor Activity  Psychomotor Activity:No data recorded  Assets  Assets: Communication Skills; Desire for Improvement; Leisure Time; Physical Health   Sleep  Sleep:No data recorded Estimated Sleeping Duration (Last 24 Hours): 11.00-12.25 hours  No data recorded  Physical Exam  Physical Exam Vitals and nursing note reviewed.  Constitutional:      Appearance: Normal appearance.  HENT:     Head: Normocephalic.  Eyes:     Extraocular Movements: Extraocular movements intact.  Pulmonary:     Effort: Pulmonary effort is normal.  Musculoskeletal:        General: Normal range of motion.     Cervical back: Normal range of motion.  Neurological:     General: No focal deficit present.     Mental Status: He is alert and oriented to person, place, and time.  Psychiatric:        Behavior: Behavior normal.    Review of Systems  Constitutional:  Negative for chills and fever.  Cardiovascular:  Negative for chest pain.  Gastrointestinal:  Negative for constipation, diarrhea, nausea and vomiting.  Musculoskeletal:  Negative for joint pain and myalgias.  Neurological:  Negative for tremors.   Blood pressure 111/71, pulse 62, temperature 98 F (36.7 C), temperature source Oral, resp. rate 18, SpO2 99%. There is no height or weight on file  to calculate BMI.  Treatment Plan Summary: Daily contact with patient to assess and evaluate symptoms and progress in treatment and Medication management   Pt admitted to [facility] for alcohol and cocaine use. Requesting residential treatment upon discharge. Pt reports difficulty sleeping due to  vivid dreams about substance use , causing to now experience cravings. He is open to trial Naltrexone  and scheduled Trazodone  for sleep and cravings.     - Discontinued CIWAs and PRN Ativan  as pt has not displayed any withdrawal symptoms since admission.  - Start Trazodone  50mg  at bedtime, scheduled as pt did not request the PRN trazodone  last night but continues to have difficulty sleeping.  - Start naltrexone  25 mg daily for alcohol craving -Social work to follow-up with coordinating residential treatment tomorrow.   -Labs reviewed showing elevated lipids and UDS positive for cocaine -LFTs are within normal limits -VS reviewed and stable.   Corean Anette Potters, MD 03/28/2024 2:22 PM

## 2024-03-28 NOTE — ED Notes (Signed)
 Pt sleeping in no acute distress. RR even and unlabored. Environment secured. Will continue to monitor for safety.

## 2024-03-29 DIAGNOSIS — F259 Schizoaffective disorder, unspecified: Secondary | ICD-10-CM | POA: Diagnosis not present

## 2024-03-29 DIAGNOSIS — F32A Depression, unspecified: Secondary | ICD-10-CM | POA: Diagnosis not present

## 2024-03-29 DIAGNOSIS — Z556 Problems related to health literacy: Secondary | ICD-10-CM | POA: Diagnosis not present

## 2024-03-29 DIAGNOSIS — F1414 Cocaine abuse with cocaine-induced mood disorder: Secondary | ICD-10-CM | POA: Diagnosis not present

## 2024-03-29 NOTE — Care Management (Signed)
 [facility] Care Management   [facility] - Per Arland, the referral has been received and the patient is under review.   [facility] and [facility] - Left HIPAA compliant voice mail message.

## 2024-03-29 NOTE — [facility] Group Notes (Signed)
 Spirituality Group   Description: Participant directed exploration of values, beliefs and meaning  **Focus on values, community, and sources of support  Following a brief framework of chaplain's role and ground rules of group behavior, participants are invited to share concerns or questions that engage spiritual life. Emphasis placed on common themes and shared experiences and ways to make meaning and clarify living into one's values.   Theory/Process/Goal: Utilize the theoretical framework of group therapy established by Celena Kite, Relational Cultural Theory and Rogerian approaches to facilitate relational empathy and use of the "here and now" to foster reflection, self-awareness, and sharing.   Observations: Azion was an active participant in the group discussion.  Jamarrion Budai L. Delores HERO.Div

## 2024-03-29 NOTE — ED Provider Notes (Addendum)
 Behavioral Health Progress Note  Date and Time: 03/29/2024 2:51 PM Name: Frederick Ballard MRN:  [id number]  Subjective:  Frederick Ballard was seen in his room on rounds. He was in bed, and tends to spend most of his time in the room in bed. He feels that he is doing fine overall. He is sleeping well at night. His appetite is refular. He is not having withdrawal symptoms today. He has no new physical complaints. He is still hoping for residential rehab placement.   Diagnosis:  Final diagnoses:  Alcohol use  Cocaine use  Substance induced mood disorder (HCC)    Total Time spent with patient: 15 minutes  Past Psychiatric History: a documented psychiatric history significant for schizoaffective disorder - depressive type, polysubstance abuse, alcohol use disorder, and cocaine use disorder. Per chart review he was hospitalized at [facility] in January 2013. He reports past substance abuse treatment here at the [facility] in January 2025, and at [facility] in [place] twice in 2019. He has been prescribed in the past Seroquel , Zoloft , Naltrexone  and Trazodone .  Past Medical History: None reported Family Psychiatric  History: Pt reports his father had a drug addiction. Social History: He resides with his older sister and states that his deceased father left her the house. He has 2 children, 70 year old daughter and 30-year-old son. 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 He denies legal issues.   Sleep: Good  Appetite:  Good  Current Medications:  Current Facility-Administered Medications  Medication Dose Route Frequency Provider Last Rate Last Admin   acetaminophen  (TYLENOL ) tablet 650 mg  650 mg Oral Q6H PRN Frederick Ballard, Frederick C, NP       alum & mag hydroxide-simeth (MAALOX/MYLANTA) 200-200-20 MG/5ML suspension 30 mL  30 mL Oral Q4H PRN Frederick Ballard, Frederick C, NP       haloperidol  (HALDOL ) tablet 5 mg  5 mg Oral TID PRN Frederick Ballard, Frederick C, NP       And   diphenhydrAMINE  (BENADRYL ) capsule  50 mg  50 mg Oral TID PRN Frederick Ballard, Frederick C, NP       haloperidol  lactate (HALDOL ) injection 5 mg  5 mg Intramuscular TID PRN Frederick Ballard, Frederick C, NP       And   diphenhydrAMINE  (BENADRYL ) injection 50 mg  50 mg Intramuscular TID PRN Frederick Ballard, Frederick C, NP       And   LORazepam  (ATIVAN ) injection 2 mg  2 mg Intramuscular TID PRN Frederick Ballard, Frederick C, NP       haloperidol  lactate (HALDOL ) injection 10 mg  10 mg Intramuscular TID PRN Frederick Alan BROCKS, NP       And   diphenhydrAMINE  (BENADRYL ) injection 50 mg  50 mg Intramuscular TID PRN Frederick Ballard, Frederick C, NP       And   LORazepam  (ATIVAN ) injection 2 mg  2 mg Intramuscular TID PRN Frederick Ballard, Frederick C, NP       magnesium  hydroxide (MILK OF MAGNESIA) suspension 30 mL  30 mL Oral Daily PRN Frederick Ballard, Frederick C, NP       multivitamin with minerals tablet 1 tablet  1 tablet Oral Daily Frederick Ballard, Frederick C, NP   1 tablet at 03/29/24 0920   naltrexone  (DEPADE) tablet 25 mg  25 mg Oral Daily Frederick Ballard, Frederick C, NP   25 mg at 03/29/24 0920   thiamine  (VITAMIN B1) tablet 100 mg  100 mg Oral Daily Frederick Ballard, Frederick C, NP   100 mg at 03/29/24 0920   traZODone  (  DESYREL ) tablet 50 mg  50 mg Oral QHS Frederick Ballard, Frederick C, NP   50 mg at 03/28/24 2131   No current outpatient medications on file.    Labs  Lab Results:  Admission on 03/25/2024, Discharged on 03/25/2024  Component Date Value Ref Range Status   WBC 03/25/2024 6.9  4.0 - 10.5 K/uL Final   RBC 03/25/2024 4.43  4.22 - 5.81 MIL/uL Final   Hemoglobin 03/25/2024 13.4  13.0 - 17.0 g/dL Final   HCT 90/87/7974 40.8  39.0 - 52.0 % Final   MCV 03/25/2024 92.1  80.0 - 100.0 fL Final   MCH 03/25/2024 30.2  26.0 - 34.0 pg Final   MCHC 03/25/2024 32.8  30.0 - 36.0 g/dL Final   RDW 90/87/7974 13.0  11.5 - 15.5 % Final   Platelets 03/25/2024 306  150 - 400 K/uL Final   nRBC 03/25/2024 0.0  0.0 - 0.2 % Final   Neutrophils Relative % 03/25/2024 36  % Final   Neutro Abs 03/25/2024 2.5  1.7 - 7.7 K/uL Final   Lymphocytes Relative 03/25/2024 48  %  Final   Lymphs Abs 03/25/2024 3.3  0.7 - 4.0 K/uL Final   Monocytes Relative 03/25/2024 12  % Final   Monocytes Absolute 03/25/2024 0.8  0.1 - 1.0 K/uL Final   Eosinophils Relative 03/25/2024 3  % Final   Eosinophils Absolute 03/25/2024 0.2  0.0 - 0.5 K/uL Final   Basophils Relative 03/25/2024 1  % Final   Basophils Absolute 03/25/2024 0.1  0.0 - 0.1 K/uL Final   Immature Granulocytes 03/25/2024 0  % Final   Abs Immature Granulocytes 03/25/2024 0.02  0.00 - 0.07 K/uL Final   Performed at [facility], [address], [place], [place] [address]   Sodium 03/25/2024 137  135 - 145 mmol/L Final   Potassium 03/25/2024 4.3  3.5 - 5.1 mmol/L Final   Chloride 03/25/2024 103  98 - 111 mmol/L Final   CO2 03/25/2024 24  22 - 32 mmol/L Final   Glucose, Bld 03/25/2024 87  70 - 99 mg/dL Final   Glucose reference range applies only to samples taken after fasting for at least 8 hours.   BUN 03/25/2024 12  6 - 20 mg/dL Final   Creatinine, Ser 03/25/2024 1.12  0.61 - 1.24 mg/dL Final   Calcium 90/87/7974 9.5  8.9 - 10.3 mg/dL Final   Total Protein 90/87/7974 6.6  6.5 - 8.1 g/dL Final   Albumin 90/87/7974 3.8  3.5 - 5.0 g/dL Final   AST 90/87/7974 24  15 - 41 U/L Final   ALT 03/25/2024 25  0 - 44 U/L Final   Alkaline Phosphatase 03/25/2024 80  38 - 126 U/L Final   Total Bilirubin 03/25/2024 0.5  0.0 - 1.2 mg/dL Final   GFR, Estimated 03/25/2024 >60  >60 mL/min Final   Comment: (NOTE) Calculated using the CKD-EPI Creatinine Equation (2021)    Anion gap 03/25/2024 10  5 - 15 Final   Performed at [facility], [address], [place], [place] [address]   Hgb A1c MFr Bld 03/25/2024 5.2  4.8 - 5.6 % Final   Comment: (NOTE) Diagnosis of Diabetes The following HbA1c ranges recommended by the American Diabetes Association (ADA) may be used as an aid in the diagnosis of diabetes mellitus.  Hemoglobin             Suggested A1C NGSP%  Diagnosis  <5.7                   Non  Diabetic  5.7-6.4                Pre-Diabetic  >6.4                   Diabetic  <7.0                   Glycemic control for                       adults with diabetes.     Mean Plasma Glucose 03/25/2024 102.54  mg/dL Final   Performed at [facility], [address], [place], [place] [address]   Magnesium  03/25/2024 1.9  1.7 - 2.4 mg/dL Final   Performed at [facility], [address] [place], [place] [address]   Alcohol, Ethyl (B) 03/25/2024 <15  <15 mg/dL Final   Comment: (NOTE) For medical purposes only. Performed at [facility], [address], [place], [place] [address]    Cholesterol 03/25/2024 180  0 - 200 mg/dL Final   Triglycerides 90/87/7974 171 (H)  <150 mg/dL Final   HDL 90/87/7974 45  >40 mg/dL Final   Total CHOL/HDL Ratio 03/25/2024 4.0  RATIO Final   VLDL 03/25/2024 34  0 - 40 mg/dL Final   LDL Cholesterol 03/25/2024 101 (H)  0 - 99 mg/dL Final   Comment:        Total Cholesterol/HDL:CHD Risk Coronary Heart Disease Risk Table                     Men   Women  1/2 Average Risk   3.4   3.3  Average Risk       5.0   4.4  2 X Average Risk   9.6   7.1  3 X Average Risk  23.4   11.0        Use the calculated Patient Ratio above and the CHD Risk Table to determine the patient's CHD Risk.        ATP III CLASSIFICATION (LDL):  <100     mg/dL   Optimal  899-870  mg/dL   Near or Above                    Optimal  130-159  mg/dL   Borderline  839-810  mg/dL   High  >809     mg/dL   Very High Performed at [facility], [address] [place], [place] [address]    TSH 03/25/2024 1.365  0.350 - 4.500 uIU/mL Final   Comment: Performed by a 3rd Generation assay with a functional sensitivity of <=0.01 uIU/mL. Performed at [facility], [address], [place], [place] [address]    POC Amphetamine UR 03/25/2024 None Detected  NONE DETECTED (Cut Off Level 1000 ng/mL) Final   POC Secobarbital (BAR) 03/25/2024 None Detected  NONE DETECTED  (Cut Off Level 300 ng/mL) Final   POC Buprenorphine (BUP) 03/25/2024 None Detected  NONE DETECTED (Cut Off Level 10 ng/mL) Final   POC Oxazepam (BZO) 03/25/2024 None Detected  NONE DETECTED (Cut Off Level 300 ng/mL) Final   POC Cocaine UR 03/25/2024 Positive (A)  NONE DETECTED (Cut Off Level 300 ng/mL) Final   POC Methamphetamine UR 03/25/2024 None Detected  NONE DETECTED (Cut Off  Level 1000 ng/mL) Final   POC Morphine 03/25/2024 None Detected  NONE DETECTED (Cut Off Level 300 ng/mL) Final   POC Methadone UR 03/25/2024 None Detected  NONE DETECTED (Cut Off Level 300 ng/mL) Final   POC Oxycodone UR 03/25/2024 None Detected  NONE DETECTED (Cut Off Level 100 ng/mL) Final   POC Marijuana UR 03/25/2024 None Detected  NONE DETECTED (Cut Off Level 50 ng/mL) Final  Office Visit on 03/09/2024  Component Date Value Ref Range Status   Neisseria Gonorrhea 03/09/2024 Negative   Final   Chlamydia 03/09/2024 Negative   Final   Trichomonas 03/09/2024 Negative   Final   Comment 03/09/2024 Normal Reference Range Trichomonas - Negative   Final   Comment 03/09/2024 Normal Reference Ranger Chlamydia - Negative   Final   Comment 03/09/2024 Normal Reference Range Neisseria Gonorrhea - Negative   Final   HIV Screen 4th Generation wRfx 03/09/2024 Non Reactive  Non Reactive Final   Comment: HIV-1/HIV-2 antibodies and HIV-1 p24 antigen were NOT detected. There is no laboratory evidence of HIV infection. HIV Negative    RPR Ser Ql 03/09/2024 Non Reactive  Non Reactive Final  Admission on 10/02/2023, Discharged on 10/02/2023  Component Date Value Ref Range Status   Influenza A Antigen, POC 10/02/2023 Negative  Negative Final   Influenza B Antigen, POC 10/02/2023 Negative  Negative Final   Covid Antigen, POC 10/02/2023 Positive (A)  Negative Final    Blood Alcohol level:  Lab Results  Component Value Date   [facility] <15 03/25/2024   ETH <10 08/04/2023    Metabolic Disorder Labs: Lab Results  Component Value Date    HGBA1C 5.2 03/25/2024   MPG 102.54 03/25/2024   MPG 111 03/27/2023   No results found for: PROLACTIN Lab Results  Component Value Date   CHOL 180 03/25/2024   TRIG 171 (H) 03/25/2024   HDL 45 03/25/2024   CHOLHDL 4.0 03/25/2024   VLDL 34 03/25/2024   LDLCALC 101 (H) 03/25/2024   LDLCALC 118 (H) 03/27/2023    Therapeutic Lab Levels: No results found for: LITHIUM No results found for: VALPROATE No results found for: CBMZ  Physical Findings   AUDIT    Flowsheet Row ED from 08/04/2023 in [facility]  Alcohol Use Disorder Identification Test Final Score (AUDIT) 24   PHQ2-9    Flowsheet Row ED from 03/25/2024 in [facility] Most recent reading at 03/25/2024  4:32 PM ED from 08/04/2023 in [facility] Most recent reading at 08/07/2023  8:11 AM ED from 03/27/2023 in [facility] Most recent reading at 03/31/2023 10:18 AM ED from 03/27/2023 in [facility] Most recent reading at 03/27/2023 11:04 AM  PHQ-2 Total Score 4 0 3 3  PHQ-9 Total Score 14 -- 12 13   Flowsheet Row ED from 03/25/2024 in [facility] Most recent reading at 03/25/2024  2:54 PM ED from 03/25/2024 in [facility] Most recent reading at 03/25/2024 11:43 AM UC from 10/02/2023 in [facility] [place] Most recent reading at 10/02/2023  5:14 PM  Ballard-SSRS RISK CATEGORY No Risk Low Risk No Risk     Musculoskeletal  Strength & Muscle Tone: within normal limits Gait & Station: normal Patient leans: N/A  Psychiatric Specialty Exam  Presentation  General Appearance:  Casual  Eye Contact: Fair  Speech: Normal Rate  Speech Volume: Normal  Handedness: -- (not assessed)  Mood and Affect  Mood: Euthymic  Affect: Congruent   Thought Process  Thought Processes: Linear; Goal  Directed  Descriptions of Associations:Intact  Orientation:Full (Time, Place and Person)  Thought Content:Logical  Diagnosis of Schizophrenia or Schizoaffective disorder in past: Yes    Hallucinations:Hallucinations: None  Ideas of Reference:None  Suicidal Thoughts:Suicidal Thoughts: No  Homicidal Thoughts:Homicidal Thoughts: No   Sensorium  Memory: Immediate Good; Recent Fair; Remote Fair  Judgment: Fair  Insight: Fair   Art therapist  Concentration: Good  Attention Span: Good  Recall: Good  Fund of Knowledge: Good  Language: Good   Psychomotor Activity  Psychomotor Activity:Psychomotor Activity: Decreased   Assets  Assets: Communication Skills; Desire for Improvement; Leisure Time   Sleep  Sleep:Sleep: Good  Estimated Sleeping Duration (Last 24 Hours): 10.50-11.50 hours  No data recorded  Physical Exam  Physical Exam Vitals and nursing note reviewed.  Constitutional:      Appearance: Normal appearance.  HENT:     Head: Normocephalic.  Eyes:     Extraocular Movements: Extraocular movements intact.  Pulmonary:     Effort: Pulmonary effort is normal.  Musculoskeletal:        General: Normal range of motion.     Cervical back: Normal range of motion.  Neurological:     General: No focal deficit present.     Mental Status: He is alert and oriented to person, place, and time.  Psychiatric:        Behavior: Behavior normal.    Review of Systems  Gastrointestinal:  Negative for constipation, diarrhea, nausea and vomiting.  Genitourinary:  Negative for dysuria.  Musculoskeletal:  Negative for joint pain and myalgias.  Neurological:  Negative for tremors.  Psychiatric/Behavioral:  Negative for hallucinations and suicidal ideas.    Blood pressure 100/64, pulse 65, temperature 97.6 F (36.4 Ballard), temperature source Oral, resp. rate 17, SpO2 95%. There is no height or weight on file to calculate BMI.  Treatment Plan  Summary: Daily contact with patient to assess and evaluate symptoms and progress in treatment and Medication management   Pt admitted to [facility] for alcohol and cocaine use. Requesting residential treatment upon discharge. Pt reports difficulty sleeping due to  vivid dreams about substance use , causing to now experience cravings.    - Discontinued CIWAs and PRN Ativan  as pt has not displayed any withdrawal symptoms since admission.  - Trazodone  50mg  at bedtime, scheduled as pt did not request the PRN trazodone  last night but continues to have difficulty sleeping.  - naltrexone  25 mg daily for alcohol craving -Social work to follow-up with coordinating residential treatment tomorrow.   -Labs reviewed showing elevated lipids and UDS positive for cocaine -LFTs are within normal limits -VS reviewed and stable.   Corean Anette Potters, MD 03/29/2024 2:51 PM

## 2024-03-29 NOTE — ED Notes (Signed)
 Pt presents as depressed. Pt cooperative, compliant with AM medications, ate breakfast. Pt denied physical pain and discomforts. Medications reviewed with pt, questions denied. Pt denies si hi and avh- verbal contract for safety provided.

## 2024-03-29 NOTE — Group Note (Signed)
 Group Topic: Social Support  Group Date: 03/29/2024 Start Time: 2000 End Time: 2130 Facilitators: Joan Plowman B  Department: [facility]  Number of Participants: 9  Group Focus: abuse issues, activities of daily living skills, anger management, community group, coping skills, feeling awareness/expression, goals/reality orientation, healthy friendships, personal responsibility, relapse prevention, and substance abuse education Treatment Modality:  Psychoeducation Interventions utilized were leisure development and support Purpose: express feelings  Name: Frederick Ballard Date of Birth: 05-Apr-1986  MR: [id number]    Level of Participation: active Quality of Participation: cooperative Interactions with others: gave feedback Mood/Affect: brightens with interaction Triggers (if applicable): NA Cognition: coherent/clear Progress: Gaining insight Response: NA Plan: patient will be encouraged to keep going to groups  Patients Problems:  Patient Active Problem List   Diagnosis Date Noted   Alcohol use disorder 03/25/2024   Substance induced mood disorder (HCC) 01/16/2021   Alcohol abuse 01/16/2021   Cocaine use disorder, moderate, in controlled environment (HCC) 01/16/2021   Polysubstance abuse (HCC) 07/24/2011   Schizoaffective disorder, depressive type (HCC) 07/24/2011

## 2024-03-29 NOTE — ED Notes (Signed)
 Pt is sleeping at the moment. No acute distress noted. Q15 safety checks in place per facility policy.

## 2024-03-29 NOTE — ED Notes (Signed)
 No concerns or complaints reported to RN. Pt currently watching TV and reading book in dayroom with peers.

## 2024-03-29 NOTE — ED Notes (Signed)
 Pt resting in bed, no distress or complaints reported.

## 2024-03-29 NOTE — ED Notes (Signed)
 Pt currently sleeping. Q15 safety checks in place.

## 2024-03-29 NOTE — ED Notes (Signed)
 Pt is sleeping at the moment. No acute distress noted. Q15 safety checks in place.

## 2024-03-29 NOTE — ED Notes (Signed)
 Pt observed and assessed in the dayroom. Pt denies any withdrawal symptoms, SI/HI/AVH.

## 2024-03-30 DIAGNOSIS — F1414 Cocaine abuse with cocaine-induced mood disorder: Secondary | ICD-10-CM | POA: Diagnosis not present

## 2024-03-30 DIAGNOSIS — Z556 Problems related to health literacy: Secondary | ICD-10-CM | POA: Diagnosis not present

## 2024-03-30 DIAGNOSIS — F32A Depression, unspecified: Secondary | ICD-10-CM | POA: Diagnosis not present

## 2024-03-30 DIAGNOSIS — F259 Schizoaffective disorder, unspecified: Secondary | ICD-10-CM | POA: Diagnosis not present

## 2024-03-30 MED ORDER — TRAZODONE HCL 50 MG PO TABS: 50.0000 mg | ORAL_TABLET | Freq: Every day | ORAL | 0 refills | Status: DC

## 2024-03-30 MED ORDER — NALTREXONE HCL 50 MG PO TABS: 25.0000 mg | ORAL_TABLET | Freq: Every day | ORAL | 0 refills | Status: DC

## 2024-03-30 MED ORDER — STRESS FORMULA/ZINC PO TABS
1.0000 | ORAL_TABLET | Freq: Every day | ORAL | 0 refills | Status: DC
Start: 2024-03-30 — End: 2024-05-11

## 2024-03-30 NOTE — ED Notes (Signed)
 Pt is sleeping. No acute distress noted.

## 2024-03-30 NOTE — ED Notes (Signed)
 Patient is awake and alert on unit.  He ate breakfast and

## 2024-03-30 NOTE — ED Provider Notes (Signed)
 FBC/OBS ASAP Discharge Summary  Date and Time: 03/30/2024 11:42 AM  Name: JAQUESE Ballard  MRN:  [id number]   Discharge Diagnoses:  Final diagnoses:  Alcohol use  Cocaine use  Substance induced mood disorder (HCC)    Subjective: [name] was seen this morning. He feels that he is ready for discharge. He had been in contact with his employer and has decided to return to work and living with his sibling. He had been offered residential rehab but has changed his mind and decided to try outpatient instead. He is sleeping and eating regularly. He is able to convincingly deny hallucinations, delusions, paranoia and thoughts of harm to self or others.   Stay Summary: During the course of patient's hospitalization, the 15-minute checks were adequate to ensure patient's safety. Patient did not exhibit erratic or aggressive behavior and was compliant with scheduled medication. Patient was recommended for outpatient psychiatry follow-up.  At the time of discharge patient is not reporting any acute suicidal/homicidal ideations/AVH, delusional thoughts or paranoia. Patient did not appear to be responding to any internal stimuli. Patient feels more confident about self-care & in managing their mental health problems. Patient currently denies any new issues or concerns. Education and supportive counseling provided throughout patient's hospital stay & upon discharge.  patient was safely detoxed fom alcohol and stimulants during the course of his stay, and any symptoms of withdrawal were addressed and have since resolved. At time of discharge the patient is not experiencing clinical syptoms of withdrawal and denies having any subjective symptoms. At time of discharge CIWA was 0.   Today upon discharge evaluation, the patient gives a mood of appropriate to circumstances. Patient denies any specific concerns and has no new physical complaints. Patient slept well, appetite good, regular bowel movements. Patient feels  that the medications have been helpful & is in agreement to continue current treatment regimen as recommended. Patient was able to engage in safety planning including plan to return to [facility] unit [facility]), the nearest emergency room or contact emergency services if patient feels unable to maintain their own safety or the safety of others. Patient had no further questions, comments, or concerns. Patient left [facility]) with all personal belongings in no apparent distress. Transportation per safe transport to home was arranged for patient.  Total Time spent with patient: 20 minutes  Past Psychiatric History: a documented psychiatric history significant for schizoaffective disorder - depressive type, polysubstance abuse, alcohol use disorder, and cocaine use disorder. Per chart review he was hospitalized at [facility] in January 2013. He reports past substance abuse treatment here at the [facility] in January 2025, and at [facility] in [place] twice in 2019. He has been prescribed in the past Seroquel , Zoloft , Naltrexone  and Trazodone .  Past Medical History: None reported Family Psychiatric  History: Pt reports his father had a drug addiction. Social History: He resides with his older sister and states that his deceased father left her the house. He has 2 children, 68 year old daughter and 38-year-old son. 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 He denies legal issues.  Tobacco Cessation:  A prescription for an FDA-approved tobacco cessation medication provided at discharge  Current Medications:  Current Facility-Administered Medications  Medication Dose Route Frequency Provider Last Rate Last Admin   acetaminophen  (TYLENOL ) tablet 650 mg  650 mg Oral Q6H PRN Brent, Amanda C, NP       alum & mag hydroxide-simeth (MAALOX/MYLANTA) 200-200-20 MG/5ML suspension 30  mL  30 mL Oral Q4H PRN Brent, Amanda C, NP       haloperidol  (HALDOL ) tablet 5  mg  5 mg Oral TID PRN Brent, Amanda C, NP       And   diphenhydrAMINE  (BENADRYL ) capsule 50 mg  50 mg Oral TID PRN Brent, Amanda C, NP       haloperidol  lactate (HALDOL ) injection 5 mg  5 mg Intramuscular TID PRN Brent, Amanda C, NP       And   diphenhydrAMINE  (BENADRYL ) injection 50 mg  50 mg Intramuscular TID PRN Brent, Amanda C, NP       And   LORazepam  (ATIVAN ) injection 2 mg  2 mg Intramuscular TID PRN Brent, Amanda C, NP       haloperidol  lactate (HALDOL ) injection 10 mg  10 mg Intramuscular TID PRN Brent, Amanda C, NP       And   diphenhydrAMINE  (BENADRYL ) injection 50 mg  50 mg Intramuscular TID PRN Brent, Amanda C, NP       And   LORazepam  (ATIVAN ) injection 2 mg  2 mg Intramuscular TID PRN Brent, Amanda C, NP       magnesium  hydroxide (MILK OF MAGNESIA) suspension 30 mL  30 mL Oral Daily PRN Brent, Amanda C, NP       multivitamin with minerals tablet 1 tablet  1 tablet Oral Daily Brent, Amanda C, NP   1 tablet at 03/30/24 0954   naltrexone  (DEPADE) tablet 25 mg  25 mg Oral Daily Brent, Amanda C, NP   25 mg at 03/30/24 0954   thiamine  (VITAMIN B1) tablet 100 mg  100 mg Oral Daily Brent, Amanda C, NP   100 mg at 03/30/24 9045   traZODone  (DESYREL ) tablet 50 mg  50 mg Oral QHS Brent, Amanda C, NP   50 mg at 03/29/24 2110   Current Outpatient Medications  Medication Sig Dispense Refill   Multiple Vitamins-Minerals (B COMPLEX-C-E-ZINC ) tablet Take 1 tablet by mouth daily. 30 tablet 0   [START ON 03/31/2024] naltrexone  (DEPADE) 50 MG tablet Take 0.5 tablets (25 mg total) by mouth daily. 30 tablet 0   traZODone  (DESYREL ) 50 MG tablet Take 1 tablet (50 mg total) by mouth at bedtime. 30 tablet 0    PTA Medications:  PTA Medications  Medication Sig   Multiple Vitamins-Minerals (B COMPLEX-C-E-ZINC ) tablet Take 1 tablet by mouth daily.   traZODone  (DESYREL ) 50 MG tablet Take 1 tablet (50 mg total) by mouth at bedtime.   [START ON 03/31/2024] naltrexone  (DEPADE) 50 MG tablet Take 0.5  tablets (25 mg total) by mouth daily.   Facility Ordered Medications  Medication   acetaminophen  (TYLENOL ) tablet 650 mg   alum & mag hydroxide-simeth (MAALOX/MYLANTA) 200-200-20 MG/5ML suspension 30 mL   magnesium  hydroxide (MILK OF MAGNESIA) suspension 30 mL   thiamine  (VITAMIN B1) tablet 100 mg   multivitamin with minerals tablet 1 tablet   [EXPIRED] hydrOXYzine  (ATARAX ) tablet 25 mg   [EXPIRED] loperamide  (IMODIUM ) capsule 2-4 mg   [EXPIRED] ondansetron  (ZOFRAN -ODT) disintegrating tablet 4 mg   haloperidol  (HALDOL ) tablet 5 mg   And   diphenhydrAMINE  (BENADRYL ) capsule 50 mg   haloperidol  lactate (HALDOL ) injection 5 mg   And   diphenhydrAMINE  (BENADRYL ) injection 50 mg   And   LORazepam  (ATIVAN ) injection 2 mg   haloperidol  lactate (HALDOL ) injection 10 mg   And   diphenhydrAMINE  (BENADRYL ) injection 50 mg   And   LORazepam  (ATIVAN ) injection 2 mg  traZODone  (DESYREL ) tablet 50 mg   naltrexone  (DEPADE) tablet 25 mg       03/25/2024    4:32 PM 08/07/2023    8:11 AM 08/05/2023   11:37 AM  Depression screen PHQ 2/9  Decreased Interest 1 0 0  Down, Depressed, Hopeless 3 0 0  PHQ - 2 Score 4 0 0  Altered sleeping 3    Tired, decreased energy 3    Change in appetite 2    Feeling bad or failure about yourself  1    Trouble concentrating 0    Moving slowly or fidgety/restless 0    Suicidal thoughts 1    PHQ-9 Score 14    Difficult doing work/chores Somewhat difficult      Flowsheet Row ED from 03/25/2024 in [facility] Most recent reading at 03/25/2024  2:54 PM ED from 03/25/2024 in [facility] Most recent reading at 03/25/2024 11:43 AM UC from 10/02/2023 in [facility] [place] Most recent reading at 10/02/2023  5:14 PM  C-SSRS RISK CATEGORY No Risk Low Risk No Risk    Musculoskeletal  Strength & Muscle Tone: within normal limits Gait & Station: normal Patient leans: N/A  Psychiatric  Specialty Exam  Presentation  General Appearance:  Casual  Eye Contact: Fair  Speech: Normal Rate  Speech Volume: Normal  Handedness: -- (not assessed)   Mood and Affect  Mood: Euthymic  Affect: Congruent   Thought Process  Thought Processes: Linear; Goal Directed  Descriptions of Associations:Intact  Orientation:Full (Time, Place and Person)  Thought Content:Logical  Diagnosis of Schizophrenia or Schizoaffective disorder in past: Yes    Hallucinations:Hallucinations: None  Ideas of Reference:None  Suicidal Thoughts:Suicidal Thoughts: No  Homicidal Thoughts:Homicidal Thoughts: No   Sensorium  Memory: Immediate Good; Recent Fair; Remote Fair  Judgment: Fair  Insight: Fair   Art therapist  Concentration: Good  Attention Span: Good  Recall: Good  Fund of Knowledge: Good  Language: Good   Psychomotor Activity  Psychomotor Activity: Psychomotor Activity: Decreased   Assets  Assets: Communication Skills; Desire for Improvement; Leisure Time   Sleep  Sleep: Sleep: Good  Estimated Sleeping Duration (Last 24 Hours): 8.50-10.00 hours  No data recorded  Physical Exam  Physical Exam Vitals and nursing note reviewed.  Constitutional:      Appearance: Normal appearance.  HENT:     Head: Normocephalic.  Eyes:     Extraocular Movements: Extraocular movements intact.  Pulmonary:     Effort: Pulmonary effort is normal.  Musculoskeletal:        General: Normal range of motion.     Cervical back: Normal range of motion.  Neurological:     General: No focal deficit present.     Mental Status: He is alert and oriented to person, place, and time.  Psychiatric:        Behavior: Behavior normal.    Review of Systems  Constitutional:  Negative for chills and fever.  Respiratory:  Negative for cough and shortness of breath.   Cardiovascular:  Negative for chest pain.  Gastrointestinal:  Negative for constipation,  diarrhea, nausea and vomiting.  Genitourinary:  Negative for dysuria.  Musculoskeletal:  Negative for joint pain and myalgias.  Skin:  Negative for rash.  Neurological:  Negative for dizziness, tremors and headaches.  Psychiatric/Behavioral:  Negative for hallucinations and suicidal ideas.    Blood pressure 101/67, pulse 72, temperature 98.3 F (36.8 C), temperature source Oral, resp. rate 18, SpO2  96%. There is no height or weight on file to calculate BMI.  Demographic Factors:  Male  Loss Factors: NA  Historical Factors: Family history of mental illness or substance abuse  Risk Reduction Factors:   Responsible for children under 76 years of age, Sense of responsibility to family, Employed, Living with another person, especially a relative, and Positive coping skills or problem solving skills  Continued Clinical Symptoms:  Alcohol/Substance Abuse/Dependencies  Cognitive Features That Contribute To Risk:  None    Suicide Risk:  Minimal: No identifiable suicidal ideation.  Patients presenting with no risk factors but with morbid ruminations; may be classified as minimal risk based on the severity of the depressive symptoms  Plan Of Care/Follow-up recommendations:  Activity:  as tolerated Diet:  low sodium, low fat  Disposition: to home with follow up  Corean Anette Potters, MD 03/30/2024, 11:42 AM

## 2024-03-30 NOTE — Care Management (Signed)
 [facility] Care Management   Writer was accepted to [facility] and he declined placement.  Patient requested AA/NA group resources.  Patient reports that his job at [employer] in [employer] has informed him that he need to be back at work by Friday.   Writer informed [facility] that they patient no longer needs the bed. Writer placed resources in the patient discharge AVS.

## 2024-03-30 NOTE — Discharge Instructions (Signed)
..   Substance Abuse Resources     SUBSTANCE USE TREATMENT for Medicaid and State Funded/IPRS  [facility] [address] [place], [place], [address] [phone] phone NOTE: [facility] is no longer offering IOP services.  Serves those who are low-income or have no insurance.  [facility] [address] [place], [place], [address] [phone] phone [phone] fax NOTE: Does have Substance Abuse-Intensive Outpatient Program [facility]) as well as transitional housing if eligible.  [facility] [address] [place], [place], [address] [phone] phone [phone] fax  [facility] [phone] [place], [place], [address] [phone] phone [phone] fax    HALFWAY HOUSES:  [facility] [phone]  [email or web address]     12 STEP PROGRAMS:  Alcoholics Anonymous of [place] SoftwareChalet.be  Narcotics Anonymous of [place] HitProtect.dk  Al-Anon of [place], [place] www.[place]alanon.org/find-meetings.html  Nar-Anon https://nar-anon.org/find-a-meetin      List of Residential placements:   [facility] in [place]: [phone]  [facility]: [phone]  [place], [place] [phone]: Male and male facility; 30-day program: (uninsured and Medicaid such as Laurena Bering, [place], [place], partners)  [facility]: [phone]; men and women's facility; 28 days; Can have Medicaid tailored plan Tour manager or Partners)  [facility]: [phone] Karoline Caldwell or Larita Fife; 28 day program; must be fully detox; tailored Medicaid or no insurance  [address] in [place], [place]; [phone]; 28 day all males program; no insurance accepted  [facility] Referral in [place]: Gabriel Rung [phone] (no insurance or Medicaid only); 90 days; outpatient services but provide housing in apartments  [place]  [facility] Admission: [phone]: Patient must complete phone screening for placement: [place], [place]; 6 month program; uninsured, Medicaid, and [employer].   [facility]: no insurance required; [phone]  [facility]: [phone]; Intake: Molly Maduro; Must fill out application online; Alecia Lemming Delay [phone] x [phone] [facility] in [place], [place]: [phone]; Admissions Coordinators Mr. Maurine Minister or Barron Alvine; 90 day program.  [facility]: [place], [place] [phone]; Co-Ed 9 month to a year program; Online application; Men entry fee is $500 (6-71months);  [employer]: [address] [place], [place] [address]; no fee or insurance required; minimum of 2 years; Highly structured; work based; Intake Coordinator is Thayer Ohm [phone]  [facility] in [place], [place]: [phone]; Fax number is [phone]; website: www.[facility].org; Requires 3-6 page autobiography; 2 year program (18 months and then 32month transitional housing); Admission fee is $300; no insurance needed; work Automotive engineer in [place], [place]: [employer] Desk Staff: Danise Edge [phone]: They have a Men's Regenerations Program 6-15months. Free program; There is an initial $300 fee however, they are willing to work with patients regarding that. Application is online.  [facility]: Admissions [phone] Doran Heater ext [phone]; Any 7-90 day program is out of pocket; 12 month program is free of charge; there is a $275 entry fee; Patient is responsible for own transportation

## 2024-04-24 ENCOUNTER — Emergency Department (HOSPITAL_COMMUNITY): Payer: Self-pay

## 2024-04-24 ENCOUNTER — Emergency Department (HOSPITAL_COMMUNITY)
Admission: EM | Admit: 2024-04-24 | Discharge: 2024-04-24 | Discharge status: Against Medical Advice | Payer: Self-pay | Attending: Emergency Medicine | Admitting: Emergency Medicine

## 2024-04-24 ENCOUNTER — Encounter (HOSPITAL_COMMUNITY): Payer: Self-pay | Admitting: *Deleted

## 2024-04-24 ENCOUNTER — Other Ambulatory Visit: Payer: Self-pay

## 2024-04-24 DIAGNOSIS — Z5321 Procedure and treatment not carried out due to patient leaving prior to being seen by health care provider: Secondary | ICD-10-CM | POA: Insufficient documentation

## 2024-04-24 DIAGNOSIS — R519 Headache, unspecified: Secondary | ICD-10-CM | POA: Insufficient documentation
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
## 2024-05-06 ENCOUNTER — Ambulatory Visit (HOSPITAL_COMMUNITY)
Admission: EM | Admit: 2024-05-06 | Discharge: 2024-05-06 | Disposition: A | Discharge status: Other Acute Inpatient Hospital | Attending: Psychiatry | Admitting: Psychiatry

## 2024-05-06 ENCOUNTER — Other Ambulatory Visit (HOSPITAL_COMMUNITY)
Admission: EM | Admit: 2024-05-06 | Discharge: 2024-05-12 | Disposition: A | Discharge status: Rehabilitation Facility | Source: Intra-hospital

## 2024-05-06 DIAGNOSIS — F14280 Cocaine dependence with cocaine-induced anxiety disorder: Secondary | ICD-10-CM | POA: Diagnosis not present

## 2024-05-06 DIAGNOSIS — F1020 Alcohol dependence, uncomplicated: Secondary | ICD-10-CM | POA: Diagnosis not present

## 2024-05-06 DIAGNOSIS — F1090 Alcohol use, unspecified, uncomplicated: Secondary | ICD-10-CM

## 2024-05-06 DIAGNOSIS — R9431 Abnormal electrocardiogram [ECG] [EKG]: Secondary | ICD-10-CM | POA: Insufficient documentation

## 2024-05-06 DIAGNOSIS — F1420 Cocaine dependence, uncomplicated: Secondary | ICD-10-CM | POA: Insufficient documentation

## 2024-05-06 DIAGNOSIS — F1910 Other psychoactive substance abuse, uncomplicated: Secondary | ICD-10-CM | POA: Diagnosis present

## 2024-05-06 DIAGNOSIS — F1010 Alcohol abuse, uncomplicated: Secondary | ICD-10-CM | POA: Insufficient documentation

## 2024-05-06 DIAGNOSIS — F1410 Cocaine abuse, uncomplicated: Secondary | ICD-10-CM

## 2024-05-06 LAB — CBC WITH DIFFERENTIAL/PLATELET
Abs Immature Granulocytes: 0.05 K/uL (ref 0.00–0.07)
Basophils Absolute: 0.1 K/uL (ref 0.0–0.1)
Basophils Relative: 1 %
Eosinophils Absolute: 0.2 K/uL (ref 0.0–0.5)
Eosinophils Relative: 2 %
HCT: 44.9 % (ref 39.0–52.0)
Hemoglobin: 15.1 g/dL (ref 13.0–17.0)
Immature Granulocytes: 1 %
Lymphocytes Relative: 42 %
Lymphs Abs: 4.3 K/uL — ABNORMAL HIGH (ref 0.7–4.0)
MCH: 30 pg (ref 26.0–34.0)
MCHC: 33.6 g/dL (ref 30.0–36.0)
MCV: 89.3 fL (ref 80.0–100.0)
Monocytes Absolute: 0.9 K/uL (ref 0.1–1.0)
Monocytes Relative: 8 %
Neutro Abs: 4.9 K/uL (ref 1.7–7.7)
Neutrophils Relative %: 46 %
Platelets: 337 K/uL (ref 150–400)
RBC: 5.03 MIL/uL (ref 4.22–5.81)
RDW: 13 % (ref 11.5–15.5)
WBC: 10.3 K/uL (ref 4.0–10.5)
nRBC: 0 % (ref 0.0–0.2)

## 2024-05-06 LAB — MAGNESIUM: Magnesium: 2.1 mg/dL (ref 1.7–2.4)

## 2024-05-06 LAB — ETHANOL: Alcohol, Ethyl (B): <15 mg/dL (ref <15)

## 2024-05-06 LAB — COMPREHENSIVE METABOLIC PANEL WITH GFR
ALT: 35 U/L (ref 0–44)
AST: 32 U/L (ref 15–41)
Albumin: 3.8 g/dL (ref 3.5–5.0)
Alkaline Phosphatase: 101 U/L (ref 38–126)
Anion gap: 8 (ref 5–15)
BUN: 18 mg/dL (ref 6–20)
CO2: 24 mmol/L (ref 22–32)
Calcium: 9.4 mg/dL (ref 8.9–10.3)
Chloride: 105 mmol/L (ref 98–111)
Creatinine, Ser: 1.06 mg/dL (ref 0.61–1.24)
GFR, Estimated: >60 mL/min (ref >60)
Glucose, Bld: 88 mg/dL (ref 70–99)
Potassium: 4.5 mmol/L (ref 3.5–5.1)
Sodium: 137 mmol/L (ref 135–145)
Total Bilirubin: 0.4 mg/dL (ref 0.0–1.2)
Total Protein: 6.3 g/dL — ABNORMAL LOW (ref 6.5–8.1)

## 2024-05-06 LAB — POCT URINE DRUG SCREEN - MANUAL ENTRY (I-SCREEN)
POC Amphetamine UR: NOT DETECTED
POC Buprenorphine (BUP): NOT DETECTED
POC Cocaine UR: POSITIVE — AB
POC Marijuana UR: POSITIVE — AB
POC Methadone UR: NOT DETECTED
POC Methamphetamine UR: NOT DETECTED
POC Morphine: NOT DETECTED
POC Oxazepam (BZO): NOT DETECTED
POC Oxycodone UR: NOT DETECTED
POC Secobarbital (BAR): NOT DETECTED

## 2024-05-06 LAB — TSH: TSH: 0.996 u[IU]/mL (ref 0.350–4.500)

## 2024-05-06 MED ORDER — ACETAMINOPHEN 325 MG PO TABS: 650.0000 mg | ORAL_TABLET | Freq: Four times a day (QID) | ORAL | Status: DC | PRN

## 2024-05-06 MED ORDER — DIPHENHYDRAMINE HCL 50 MG PO CAPS: 50.0000 mg | ORAL_CAPSULE | Freq: Three times a day (TID) | ORAL | Status: DC | PRN

## 2024-05-06 MED ORDER — ALUM & MAG HYDROXIDE-SIMETH 200-200-20 MG/5ML PO SUSP: 30.0000 mL | ORAL | Status: DC | PRN

## 2024-05-06 MED ORDER — HALOPERIDOL LACTATE 5 MG/ML IJ SOLN: 5.0000 mg | Freq: Three times a day (TID) | INTRAMUSCULAR | Status: DC | PRN

## 2024-05-06 MED ORDER — DIPHENHYDRAMINE HCL 50 MG/ML IJ SOLN: 50.0000 mg | Freq: Three times a day (TID) | INTRAMUSCULAR | Status: DC | PRN

## 2024-05-06 MED ORDER — IBUPROFEN 600 MG PO TABS
600.0000 mg | ORAL_TABLET | Freq: Four times a day (QID) | ORAL | Status: DC | PRN
  Administered 2024-05-09 – 2024-05-11 (×4): 600 mg via ORAL
  Filled 2024-05-06: qty 10
  Filled 2024-05-06 (×4): qty 1

## 2024-05-06 MED ORDER — NICOTINE 14 MG/24HR TD PT24
14.0000 mg | MEDICATED_PATCH | Freq: Every day | TRANSDERMAL | Status: DC
  Administered 2024-05-07 – 2024-05-12 (×6): 14 mg via TRANSDERMAL
  Filled 2024-05-06 (×5): qty 1
  Filled 2024-05-06: qty 7
  Filled 2024-05-06 (×2): qty 1

## 2024-05-06 MED ORDER — NALTREXONE HCL 50 MG PO TABS
25.0000 mg | ORAL_TABLET | Freq: Every day | ORAL | Status: DC
  Administered 2024-05-07 – 2024-05-12 (×6): 25 mg via ORAL
  Filled 2024-05-06: qty 1
  Filled 2024-05-06: qty 4
  Filled 2024-05-06 (×5): qty 1
  Filled 2024-05-06: qty 7

## 2024-05-06 MED ORDER — CHLORDIAZEPOXIDE HCL 25 MG PO CAPS: 25.0000 mg | ORAL_CAPSULE | Freq: Four times a day (QID) | ORAL | Status: DC | PRN

## 2024-05-06 MED ORDER — LOPERAMIDE HCL 2 MG PO CAPS: 2.0000 mg | ORAL_CAPSULE | ORAL | Status: AC | PRN

## 2024-05-06 MED ORDER — ADULT MULTIVITAMIN W/MINERALS CH
1.0000 | ORAL_TABLET | Freq: Every day | ORAL | Status: DC
  Administered 2024-05-06: 1 via ORAL
  Filled 2024-05-06: qty 1

## 2024-05-06 MED ORDER — LORAZEPAM 2 MG/ML IJ SOLN: 2.0000 mg | Freq: Three times a day (TID) | INTRAMUSCULAR | Status: DC | PRN

## 2024-05-06 MED ORDER — ONDANSETRON 4 MG PO TBDP: 4.0000 mg | ORAL_TABLET | Freq: Four times a day (QID) | ORAL | Status: DC | PRN

## 2024-05-06 MED ORDER — HALOPERIDOL 5 MG PO TABS: 5.0000 mg | ORAL_TABLET | Freq: Three times a day (TID) | ORAL | Status: DC | PRN

## 2024-05-06 MED ORDER — TRAZODONE HCL 50 MG PO TABS: 50.0000 mg | ORAL_TABLET | Freq: Every evening | ORAL | Status: DC | PRN

## 2024-05-06 MED ORDER — TRAZODONE HCL 50 MG PO TABS
50.0000 mg | ORAL_TABLET | Freq: Every evening | ORAL | Status: DC | PRN
  Administered 2024-05-06: 50 mg via ORAL
  Filled 2024-05-06: qty 1

## 2024-05-06 MED ORDER — HALOPERIDOL LACTATE 5 MG/ML IJ SOLN: 10.0000 mg | Freq: Three times a day (TID) | INTRAMUSCULAR | Status: DC | PRN

## 2024-05-06 MED ORDER — MAGNESIUM HYDROXIDE 400 MG/5ML PO SUSP: 30.0000 mL | Freq: Every day | ORAL | Status: DC | PRN

## 2024-05-06 MED ORDER — THIAMINE HCL 100 MG/ML IJ SOLN
100.0000 mg | Freq: Once | INTRAMUSCULAR | Status: AC
  Administered 2024-05-06: 100 mg via INTRAMUSCULAR
  Filled 2024-05-06: qty 2

## 2024-05-06 MED ORDER — CHLORDIAZEPOXIDE HCL 25 MG PO CAPS: 25.0000 mg | ORAL_CAPSULE | Freq: Four times a day (QID) | ORAL | Status: AC | PRN

## 2024-05-06 MED ORDER — NALTREXONE HCL 50 MG PO TABS: 25.0000 mg | ORAL_TABLET | Freq: Every day | ORAL | Status: DC

## 2024-05-06 MED ORDER — HYDROXYZINE HCL 25 MG PO TABS: 25.0000 mg | ORAL_TABLET | Freq: Four times a day (QID) | ORAL | Status: DC | PRN

## 2024-05-06 MED ORDER — ACETAMINOPHEN 325 MG PO TABS
650.0000 mg | ORAL_TABLET | Freq: Four times a day (QID) | ORAL | Status: DC | PRN
  Administered 2024-05-09: 650 mg via ORAL
  Filled 2024-05-06: qty 2

## 2024-05-06 MED ORDER — ONDANSETRON 4 MG PO TBDP: 4.0000 mg | ORAL_TABLET | Freq: Four times a day (QID) | ORAL | Status: AC | PRN

## 2024-05-06 MED ORDER — NICOTINE 14 MG/24HR TD PT24
14.0000 mg | MEDICATED_PATCH | Freq: Every day | TRANSDERMAL | Status: DC
  Administered 2024-05-06: 14 mg via TRANSDERMAL
  Filled 2024-05-06: qty 1

## 2024-05-06 MED ORDER — ADULT MULTIVITAMIN W/MINERALS CH
1.0000 | ORAL_TABLET | Freq: Every day | ORAL | Status: DC
  Administered 2024-05-07 – 2024-05-12 (×6): 1 via ORAL
  Filled 2024-05-06 (×3): qty 1
  Filled 2024-05-06: qty 7
  Filled 2024-05-06 (×3): qty 1

## 2024-05-06 MED ORDER — HYDROXYZINE HCL 25 MG PO TABS
25.0000 mg | ORAL_TABLET | Freq: Four times a day (QID) | ORAL | Status: AC | PRN
  Administered 2024-05-06: 25 mg via ORAL
  Filled 2024-05-06: qty 1

## 2024-05-06 MED ORDER — LOPERAMIDE HCL 2 MG PO CAPS: 2.0000 mg | ORAL_CAPSULE | ORAL | Status: DC | PRN

## 2024-05-06 MED ORDER — IBUPROFEN 600 MG PO TABS: 600.0000 mg | ORAL_TABLET | Freq: Four times a day (QID) | ORAL | Status: DC | PRN

## 2024-05-06 NOTE — ED Notes (Signed)
 Patient has been brought on unit, familiarized with unit, and food has been provided. Patient is sitting in bed eating, patient appears to be in no acute distress, respirations are even and unlabored, this nurse will continue to monitor patient for safety.

## 2024-05-06 NOTE — ED Notes (Signed)
 Patient is lying in bed with eyes closed, respirations are even and unlabored, will continue to monitor patient for safety.

## 2024-05-06 NOTE — ED Notes (Signed)
 Sent labs to [facility], two lavender tubes, one red, one dark green, one light green, one yellow with patient's labels on them, with Patient's Order Requisition.

## 2024-05-06 NOTE — Group Note (Signed)
 Group Topic: Emotional Regulation  Group Date: 05/06/2024 Start Time: 2000 End Time: 2030 Facilitators: Verdon Jacqualyn BRAVO, NT  Department: [facility]  Number of Participants: 9  Group Focus: daily focus Treatment Modality:  Individual Therapy Interventions utilized were exploration Purpose: express feelings  Name: Frederick Ballard Date of Birth: Nov 14, 1985  MR: [id number]    Level of Participation: active Quality of Participation: cooperative Interactions with others: gave feedback Mood/Affect: appropriate Triggers (if applicable): n/a Cognition: coherent/clear Progress: Moderate Response: n/a Plan: follow-up needed  Patients Problems:  Patient Active Problem List   Diagnosis Date Noted   Alcohol use disorder 03/25/2024   Substance induced mood disorder (HCC) 01/16/2021   Alcohol abuse 01/16/2021   Cocaine use disorder, moderate, in controlled environment (HCC) 01/16/2021   Polysubstance abuse (HCC) 07/24/2011   Schizoaffective disorder, depressive type (HCC) 07/24/2011
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
## 2024-05-06 NOTE — Discharge Instructions (Signed)
Pt transferred to [facility]. 

## 2024-05-06 NOTE — Progress Notes (Signed)
   05/06/24 1107  [facility] Triage Screening (Walk-ins at [facility] only)  How Did You Hear About Us ? Self  What Is the Reason for Your Visit/Call Today? Frederick Ballard is a 38 year old male presenting to [facility] unaccompanied. Pt states that he is looking for detox off of Cocaine and Alcohol. Pt states he uses these substance daily, but is unsure of the amount. Pt states he has been struggling with Cocaine and Alcohol since he was 19 (off and on use). Pt denies Si, Hi and AVH. Pt states that he did drink and use Cocaine last night, but is not sure how much he used. Pts appearance is neat, eye contact is normal, motor activity is normal, affect is full, speech is normal. Pt does not currently have any mental health diagnoses at this time. Pt states he has been at [facility] before, but he is no longer taking the medication they prescribed him.  How Long Has This Been Causing You Problems? > than 6 months  Have You Recently Had Any Thoughts About Hurting Yourself? No  Are You Planning to Commit Suicide/Harm Yourself At This time? No  Have you Recently Had Thoughts About Hurting Someone [name]? No  Are You Planning To Harm Someone At This Time? No  Physical Abuse Denies  Verbal Abuse Denies  Sexual Abuse Denies  Exploitation of patient/patient's resources Denies  Self-Neglect Denies  Possible abuse reported to: Other (Comment)  Are you currently experiencing any auditory, visual or other hallucinations? No  Have You Used Any Alcohol or Drugs in the Past 24 Hours? Yes  What Did You Use and How Much? alcohol, cocaine, unsure of amount  Do you have any current medical co-morbidities that require immediate attention? No  What Do You Feel Would Help You the Most Today? Alcohol or Drug Use Treatment  If access to [facility] was not available, would you have sought care in the Emergency Department? No  Determination of Need Urgent (48 hours)  Options For Referral Facility-Based Crisis  Determination of Need filed? Yes

## 2024-05-06 NOTE — ED Notes (Signed)
 Pt oriented to unit, questions denied. Pt presents as nervous and depressed, says he needs with substance abuse and wants long term treatment. Pt denies pain at this time.
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
## 2024-05-06 NOTE — Care Management (Signed)
 OBS Care Management   Writer referred patient to [facility] and [facility].

## 2024-05-06 NOTE — ED Notes (Signed)
 Patient is in the bedroom calm and sleeping. NAD. CIWA assessment not completed at this time. Will monitor for safety.

## 2024-05-07 DIAGNOSIS — F14280 Cocaine dependence with cocaine-induced anxiety disorder: Secondary | ICD-10-CM | POA: Diagnosis not present

## 2024-05-07 DIAGNOSIS — F1020 Alcohol dependence, uncomplicated: Secondary | ICD-10-CM | POA: Diagnosis not present

## 2024-05-07 MED ORDER — TRIPLE ANTIBIOTIC 3.5-400-5000 EX OINT
1.0000 | TOPICAL_OINTMENT | Freq: Three times a day (TID) | CUTANEOUS | Status: DC | PRN
  Filled 2024-05-07: qty 8

## 2024-05-07 NOTE — Group Note (Signed)
 Group Topic: Healthy Self Image and Positive Change  Group Date: 05/07/2024 Start Time: 1000 End Time: 1045 Facilitators: Laneta Renea POUR, NT; Alyse Leilani LABOR, NT  Department: [facility]  Number of Participants: 10  Group Focus: coping skills Treatment Modality:  Psychoeducation Interventions utilized were patient education and problem solving Purpose: enhance coping skills and increase insight  Name: Frederick Ballard Date of Birth: [birth year]  MR: [id number]    Pt did not attend group.   Patients Problems:  Patient Active Problem List   Diagnosis Date Noted   Alcohol use disorder 03/25/2024   Substance induced mood disorder (HCC) 01/16/2021   Alcohol abuse 01/16/2021   Cocaine use disorder, moderate, in controlled environment (HCC) 01/16/2021   Polysubstance abuse (HCC) 07/24/2011   Schizoaffective disorder, depressive type (HCC) 07/24/2011

## 2024-05-07 NOTE — ED Notes (Addendum)
 Patient continues, he present in an irritable mood, stated he focusing on leaving here.He continue to deny SI, HI plan or intent and he c/o laceration to forehead prior to admission and requested neosporin to aid healing. Provier notified and order medication Pt was compliant and affect and mood and remain depressed
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
## 2024-05-07 NOTE — Progress Notes (Signed)
 Patient is alert and oriented x 4 with no acute distress noted.  Patient currently denies SI/HI/AVH, and denies pain/discomfort.  Patient has been observed in the dayroom watching TV.  No current issues or concerns expressed.  Will continue to monitor on Q 15 min safety checks.

## 2024-05-07 NOTE — Group Note (Signed)
 Group Topic: Recovery Basics  Group Date: 05/07/2024 Start Time: 2000 End Time: 2100 Facilitators: Anice Benton LABOR, NT  Department: [facility]  Number of Participants: 7  Group Focus: abuse issues, goals/reality orientation, and relapse prevention(AA Group) Treatment Modality:  Solution-Focused Therapy Interventions utilized were group exercise Purpose: increase insight and relapse prevention strategies  Name: ARTIS BEGGS Date of Birth: 29-Jul-1985  MR: [id number]    Level of Participation: active Quality of Participation: attentive Interactions with others: gave feedback Mood/Affect: appropriate Triggers (if applicable): N/A Cognition: coherent/clear Progress: Moderate Response: Good Plan: follow-up needed  Patients Problems:  Patient Active Problem List   Diagnosis Date Noted   Alcohol use disorder 03/25/2024   Substance induced mood disorder (HCC) 01/16/2021   Alcohol abuse 01/16/2021   Cocaine use disorder, moderate, in controlled environment (HCC) 01/16/2021   Polysubstance abuse (HCC) 07/24/2011   Schizoaffective disorder, depressive type (HCC) 07/24/2011

## 2024-05-07 NOTE — ED Notes (Signed)
Patient asleep at this time. NAD.

## 2024-05-07 NOTE — Progress Notes (Signed)
 Patient is currently resting with eyes closed.  No acute distress noted.  Respirations present and unlabored.

## 2024-05-07 NOTE — ED Notes (Signed)
 Assumed care of patient this am, he present in an irritable mood, stated he just want to get out of here, without escalating further. Pt was compliant with taking his medications, avoided eye contact, affect and mood depressed.

## 2024-05-08 DIAGNOSIS — F1020 Alcohol dependence, uncomplicated: Secondary | ICD-10-CM | POA: Diagnosis not present

## 2024-05-08 DIAGNOSIS — F14280 Cocaine dependence with cocaine-induced anxiety disorder: Secondary | ICD-10-CM | POA: Diagnosis not present

## 2024-05-08 MED ORDER — TRAZODONE HCL 50 MG PO TABS
50.0000 mg | ORAL_TABLET | Freq: Every day | ORAL | Status: DC
  Administered 2024-05-08 – 2024-05-11 (×4): 50 mg via ORAL
  Filled 2024-05-08 (×3): qty 1
  Filled 2024-05-08: qty 7
  Filled 2024-05-08 (×2): qty 1

## 2024-05-08 NOTE — Group Note (Signed)
 Group Topic: Understanding Self  Group Date: 05/08/2024 Start Time: 1200 End Time: 1230 Facilitators: Daved Tinnie HERO, RN  Department: [facility]  Number of Participants: 8  Group Focus: nursing group Treatment Modality:  Psychoeducation Interventions utilized were exploration Purpose: increase insight  Name: Frederick Ballard Date of Birth: 1985-09-30  MR: [id number]    Level of Participation: moderate Quality of Participation: attentive and cooperative Interactions with others: gave feedback Mood/Affect: appropriate Triggers (if applicable): n/a Cognition: coherent/clear Progress: Gaining insight Response: to align their actions with their personal values, pt says they will get a job, save some money, and go to rehab Plan: patient will be encouraged to attend future RN education groups   Patients Problems:  Patient Active Problem List   Diagnosis Date Noted   Alcohol use disorder 03/25/2024   Substance induced mood disorder (HCC) 01/16/2021   Alcohol abuse 01/16/2021   Cocaine use disorder, moderate, in controlled environment (HCC) 01/16/2021   Polysubstance abuse (HCC) 07/24/2011   Schizoaffective disorder, depressive type (HCC) 07/24/2011

## 2024-05-08 NOTE — ED Notes (Signed)
 Paitent provided lunch.

## 2024-05-08 NOTE — Progress Notes (Signed)
 Patient currently resting with eyes closed at this time.  No acute distress noted.  Respirations present and unlabored.  No issues noted at this time.

## 2024-05-08 NOTE — ED Notes (Signed)
 Pt reports to have eaten lunch. Pt denies current withdrawal symptoms. Pt continues to present as depressed and guarded, but polite and cooperative.

## 2024-05-08 NOTE — Group Note (Signed)
 Group Topic: Healthy Self Image and Positive Change  Group Date: 05/08/2024 Start Time: 1700 End Time: 1800 Facilitators: Nena Lesley PARAS, NT  Department: [facility]  Number of Participants: 8  Group Focus: check in, clarity of thought, and feeling awareness/expression Treatment Modality:  Psychoeducation Interventions utilized were clarification, exploration, and problem solving Purpose: explore maladaptive thinking, express feelings, and increase insight  Name: Frederick Ballard Date of Birth: [birth year]  MR: [id number]    Level of Participation: active Quality of Participation: engaged Interactions with others: gave feedback Mood/Affect: appropriate Triggers (if applicable): N/A Cognition: coherent/clear Progress: Gaining insight Response: Patient engaged in the group by answering questions and sharing personal examples. Patient also made appropriate and timely jokes during the group that made staff and other patients laugh. Plan: patient will be encouraged to coming to groups throughout the duration of his treatment  Patients Problems:  Patient Active Problem List   Diagnosis Date Noted   Alcohol use disorder 03/25/2024   Substance induced mood disorder (HCC) 01/16/2021   Alcohol abuse 01/16/2021   Cocaine use disorder, moderate, in controlled environment (HCC) 01/16/2021   Polysubstance abuse (HCC) 07/24/2011   Schizoaffective disorder, depressive type (HCC) 07/24/2011

## 2024-05-08 NOTE — Progress Notes (Signed)
 Patient is alert and oriented x 4 with no acute distress noted. Patient appears to be guarded with minimal interaction, but also observed watching TV in the dayroom.  Overall, patient is calm and cooperative during shift.   Patient was compliant with medication during shift.  Will continue to monitor Q 15 min safety checks.  No current issues noted or concerns voiced.

## 2024-05-08 NOTE — ED Notes (Signed)
 Pt reports to feel 'content' , however, presents as depressed and somewhat guarded. Pt denies si hi and avh- verbal contract for safety provided. Pt denies physical pain and discomfort.  Medications reviewed, questions denied.

## 2024-05-08 NOTE — ED Provider Notes (Signed)
 Behavioral Health Progress Note  Date and Time: 05/08/2024 2:42 PM Name: Frederick Ballard MRN:  [id number]   HPI: Frederick Ballard is a 38 y.o. male with a history of alcohol use disorder as well as cocaine use disorder who presented to the [facility] on 05/06/2024 with complaints of worsening alcohol & cocaine abuse, requesting detoxification from these substances as well as a referral to longer-term rehabilitation facilities. He was admitted to [facility] for treatment. UDS positive for cocaine and marijuana. BAL negative.   Subjective: Upon evaluation today patient is observed in the day room watching TV, in no acute distress.  Patient reports improved sleep with the use of as needed trazodone  and request to have it ordered as a scheduled medication.  Patient continues to deny having any withdrawal symptoms and has not required any as needed withdrawal medications in relation to CIWA's.  Most recent CIWA score was 0.  He has been compliant with medications and denies having any side effects from the naltrexone .  Although patient has been attending groups, he remains very guarded and withdrawn, with minimal participation.  Patient reports that he is still interested in receiving residential treatment if excepted somewhere.  Discussed with patient that referrals have been sent out on Friday and will likely have an update on acceptances tomorrow.  Patient continues to deny having any suicidal ideations, homicidal ideations and psychotic symptoms.  Although patient does not display any overt signs of psychosis, he does present somewhat distracted and lacks eye contact during assessment.  Patient states that he does sometimes get anxious when speaking with the providers, which could explain behaviors.  Patient continues to deny need for medication for underlying depressive symptoms, will continue to encourage treatment for depression.  During evaluation Frederick Ballard is sitting up in  assessment room, in no acute distress. He is alert & oriented x 4, calm, cooperative and attentive for this assessment. His mood is dysphoric with congruent affect. He has normal speech, and behavior. Objectively there is no evidence of psychosis/mania or delusional thinking. Pt does not appear to be responding to internal or external stimuli. Patient is able to converse coherently, goal directed thoughts, no distractibility, or pre-occupation. He denies suicidal/self-harm/homicidal ideation, psychosis, and paranoia. Patient answered assessment question appropriately.    Labs reviewed: UDS positive for cocaine and marijuana.  CMP, CBC, TSH, and ethanol results unremarkable.  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 Per chart review he was hospitalized at [facility] in 2013. He reports past substance abuse treatment here at the [facility] in January and September 2025, and at [facility] in [place] twice in 2019. He has been prescribed in the past Seroquel , Zoloft , Naltrexone  and Trazodone .   Past Medical History: No reported history   Family History: Father with history of drug addiction    Social History: Patient currently living with his older sister in a house that was left today and by their deceased father.  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  Denies any current legal issues and endorses alcohol and cocaine use.  Additional Social History:                         Sleep: Good  Appetite:  Good  Current Medications:  Current Facility-Administered Medications  Medication Dose Route Frequency Provider Last Rate Last Admin   acetaminophen  (TYLENOL ) tablet 650 mg  650 mg Oral Q6H PRN Tiasha Helvie C, NP       alum &  mag hydroxide-simeth (MAALOX/MYLANTA) 200-200-20 MG/5ML suspension 30 mL  30 mL Oral Q4H PRN Braidon Chermak C, NP       chlordiazePOXIDE (LIBRIUM) capsule 25 mg  25 mg Oral Q6H PRN Burnell Matlin C, NP       haloperidol  (HALDOL ) tablet 5 mg  5 mg Oral TID PRN Molly Maselli C, NP       And   diphenhydrAMINE  (BENADRYL ) capsule 50 mg  50 mg Oral TID PRN Rande Dario C, NP       haloperidol  lactate (HALDOL ) injection 10 mg  10 mg Intramuscular TID PRN Thresa Alan BROCKS, NP       And   diphenhydrAMINE  (BENADRYL ) injection 50 mg  50 mg Intramuscular TID PRN Mallerie Blok C, NP       And   LORazepam  (ATIVAN ) injection 2 mg  2 mg Intramuscular TID PRN Tynetta Bachmann C, NP       haloperidol  lactate (HALDOL ) injection 5 mg  5 mg Intramuscular TID PRN Eithen Castiglia C, NP       And   diphenhydrAMINE  (BENADRYL ) injection 50 mg  50 mg Intramuscular TID PRN Bhavesh Vazquez C, NP       And   LORazepam  (ATIVAN ) injection 2 mg  2 mg Intramuscular TID PRN Desmund Elman C, NP       hydrOXYzine  (ATARAX ) tablet 25 mg  25 mg Oral Q6H PRN Bevan Vu C, NP   25 mg at 05/06/24 2118   ibuprofen  (ADVIL ) tablet 600 mg  600 mg Oral Q6H PRN Swan Zayed C, NP       loperamide  (IMODIUM ) capsule 2-4 mg  2-4 mg Oral PRN Jakarri Lesko C, NP       magnesium  hydroxide (MILK OF MAGNESIA) suspension 30 mL  30 mL Oral Daily PRN Suhayla Chisom C, NP       multivitamin with minerals tablet 1 tablet  1 tablet Oral Daily Veleria Barnhardt C, NP   1 tablet at 05/08/24 0932   naltrexone  (DEPADE) tablet 25 mg  25 mg Oral Daily Irene Collings C, NP   25 mg at 05/08/24 0932   neomycin-bacitracin-polymyxin [phone] OINT 1 Application  1 Application Topical TID PRN Cole Kandi BROCKS, MD       nicotine  (NICODERM CQ  - dosed in mg/24 hours) patch 14 mg  14 mg Transdermal Daily Maddilyn Campus C, NP   14 mg at 05/08/24 9066   ondansetron  (ZOFRAN -ODT) disintegrating tablet 4 mg  4 mg Oral Q6H PRN Keliyah Spillman C, NP       traZODone  (DESYREL ) tablet 50  mg  50 mg Oral QHS Robbi Spells C, NP       Current Outpatient Medications  Medication Sig Dispense Refill   ibuprofen  (ADVIL ) 200 MG tablet Take 600 mg by mouth every 6 (six) hours as needed (For pain).     Multiple Vitamins-Minerals (B COMPLEX-C-E-ZINC ) tablet Take 1 tablet by mouth daily. (Patient not taking: Reported on 05/06/2024) 30 tablet 0   naltrexone  (DEPADE) 50 MG tablet Take 0.5 tablets (25 mg total) by mouth daily. (Patient not taking: Reported on 05/06/2024) 30 tablet 0   traZODone  (DESYREL ) 50 MG tablet Take 1 tablet (50 mg total) by mouth at bedtime. (Patient not taking:  Reported on 05/06/2024) 30 tablet 0    Labs  Lab Results:  Admission on 05/06/2024, Discharged on 05/06/2024  Component Date Value Ref Range Status   WBC 05/06/2024 10.3  4.0 - 10.5 K/uL Final   RBC 05/06/2024 5.03  4.22 - 5.81 MIL/uL Final   Hemoglobin 05/06/2024 15.1  13.0 - 17.0 g/dL Final   HCT 89/75/7974 44.9  39.0 - 52.0 % Final   MCV 05/06/2024 89.3  80.0 - 100.0 fL Final   MCH 05/06/2024 30.0  26.0 - 34.0 pg Final   MCHC 05/06/2024 33.6  30.0 - 36.0 g/dL Final   RDW 89/75/7974 13.0  11.5 - 15.5 % Final   Platelets 05/06/2024 337  150 - 400 K/uL Final   nRBC 05/06/2024 0.0  0.0 - 0.2 % Final   Neutrophils Relative % 05/06/2024 46  % Final   Neutro Abs 05/06/2024 4.9  1.7 - 7.7 K/uL Final   Lymphocytes Relative 05/06/2024 42  % Final   Lymphs Abs 05/06/2024 4.3 (H)  0.7 - 4.0 K/uL Final   Monocytes Relative 05/06/2024 8  % Final   Monocytes Absolute 05/06/2024 0.9  0.1 - 1.0 K/uL Final   Eosinophils Relative 05/06/2024 2  % Final   Eosinophils Absolute 05/06/2024 0.2  0.0 - 0.5 K/uL Final   Basophils Relative 05/06/2024 1  % Final   Basophils Absolute 05/06/2024 0.1  0.0 - 0.1 K/uL Final   Immature Granulocytes 05/06/2024 1  % Final   Abs Immature Granulocytes 05/06/2024 0.05  0.00 - 0.07 K/uL Final   Performed at [facility], [address], [place], [place] [address]   Sodium  05/06/2024 137  135 - 145 mmol/L Final   Potassium 05/06/2024 4.5  3.5 - 5.1 mmol/L Final   Chloride 05/06/2024 105  98 - 111 mmol/L Final   CO2 05/06/2024 24  22 - 32 mmol/L Final   Glucose, Bld 05/06/2024 88  70 - 99 mg/dL Final   Glucose reference range applies only to samples taken after fasting for at least 8 hours.   BUN 05/06/2024 18  6 - 20 mg/dL Final   Creatinine, Ser 05/06/2024 1.06  0.61 - 1.24 mg/dL Final   Calcium 89/75/7974 9.4  8.9 - 10.3 mg/dL Final   Total Protein 89/75/7974 6.3 (L)  6.5 - 8.1 g/dL Final   Albumin 89/75/7974 3.8  3.5 - 5.0 g/dL Final   AST 89/75/7974 32  15 - 41 U/L Final   ALT 05/06/2024 35  0 - 44 U/L Final   Alkaline Phosphatase 05/06/2024 101  38 - 126 U/L Final   Total Bilirubin 05/06/2024 0.4  0.0 - 1.2 mg/dL Final   GFR, Estimated 05/06/2024 >60  >60 mL/min Final   Comment: (NOTE) Calculated using the CKD-EPI Creatinine Equation (2021)    Anion gap 05/06/2024 8  5 - 15 Final   Performed at [facility], [address] [place], [place] [address]   Magnesium  05/06/2024 2.1  1.7 - 2.4 mg/dL Final   Performed at [facility], [address] [place], [place] [address]   Alcohol, Ethyl (B) 05/06/2024 <15  <15 mg/dL Final   Comment: (NOTE) For medical purposes only. Performed at [facility], [address] [place], [place] [address]    TSH 05/06/2024 0.996  0.350 - 4.500 uIU/mL Final   Comment: Performed by a 3rd Generation assay with a functional sensitivity of <=0.01 uIU/mL. Performed at [facility], [address] [place], [place] [address]    POC Amphetamine UR 05/06/2024 None Detected  NONE DETECTED (Cut Off Level 1000 ng/mL) Final   POC Secobarbital (BAR) 05/06/2024 None Detected  NONE DETECTED (Cut Off Level 300 ng/mL) Final   POC Buprenorphine (BUP) 05/06/2024 None Detected  NONE DETECTED (Cut Off Level 10 ng/mL) Final   POC Oxazepam (BZO) 05/06/2024 None Detected  NONE DETECTED (Cut Off Level 300 ng/mL)  Final   POC Cocaine UR 05/06/2024 Positive (A)  NONE DETECTED (Cut Off Level 300 ng/mL) Final   POC Methamphetamine UR 05/06/2024 None Detected  NONE DETECTED (Cut Off Level 1000 ng/mL) Final   POC Morphine 05/06/2024 None Detected  NONE DETECTED (Cut Off Level 300 ng/mL) Final   POC Methadone UR 05/06/2024 None Detected  NONE DETECTED (Cut Off Level 300 ng/mL) Final   POC Oxycodone UR 05/06/2024 None Detected  NONE DETECTED (Cut Off Level 100 ng/mL) Final   POC Marijuana UR 05/06/2024 Positive (A)  NONE DETECTED (Cut Off Level 50 ng/mL) Final  Admission on 03/25/2024, Discharged on 03/25/2024  Component Date Value Ref Range Status   WBC 03/25/2024 6.9  4.0 - 10.5 K/uL Final   RBC 03/25/2024 4.43  4.22 - 5.81 MIL/uL Final   Hemoglobin 03/25/2024 13.4  13.0 - 17.0 g/dL Final   HCT 90/87/7974 40.8  39.0 - 52.0 % Final   MCV 03/25/2024 92.1  80.0 - 100.0 fL Final   MCH 03/25/2024 30.2  26.0 - 34.0 pg Final   MCHC 03/25/2024 32.8  30.0 - 36.0 g/dL Final   RDW 90/87/7974 13.0  11.5 - 15.5 % Final   Platelets 03/25/2024 306  150 - 400 K/uL Final   nRBC 03/25/2024 0.0  0.0 - 0.2 % Final   Neutrophils Relative % 03/25/2024 36  % Final   Neutro Abs 03/25/2024 2.5  1.7 - 7.7 K/uL Final   Lymphocytes Relative 03/25/2024 48  % Final   Lymphs Abs 03/25/2024 3.3  0.7 - 4.0 K/uL Final   Monocytes Relative 03/25/2024 12  % Final   Monocytes Absolute 03/25/2024 0.8  0.1 - 1.0 K/uL Final   Eosinophils Relative 03/25/2024 3  % Final   Eosinophils Absolute 03/25/2024 0.2  0.0 - 0.5 K/uL Final   Basophils Relative 03/25/2024 1  % Final   Basophils Absolute 03/25/2024 0.1  0.0 - 0.1 K/uL Final   Immature Granulocytes 03/25/2024 0  % Final   Abs Immature Granulocytes 03/25/2024 0.02  0.00 - 0.07 K/uL Final   Performed at [facility], [address], [place], [place] [address]   Sodium 03/25/2024 137  135 - 145 mmol/L Final   Potassium 03/25/2024 4.3  3.5 - 5.1 mmol/L Final   Chloride 03/25/2024 103   98 - 111 mmol/L Final   CO2 03/25/2024 24  22 - 32 mmol/L Final   Glucose, Bld 03/25/2024 87  70 - 99 mg/dL Final   Glucose reference range applies only to samples taken after fasting for at least 8 hours.   BUN 03/25/2024 12  6 - 20 mg/dL Final   Creatinine, Ser 03/25/2024 1.12  0.61 - 1.24 mg/dL Final   Calcium 90/87/7974 9.5  8.9 - 10.3 mg/dL Final   Total Protein 90/87/7974 6.6  6.5 - 8.1 g/dL Final   Albumin 90/87/7974 3.8  3.5 - 5.0 g/dL Final   AST 90/87/7974 24  15 - 41 U/L Final   ALT 03/25/2024 25  0 - 44 U/L Final   Alkaline  Phosphatase 03/25/2024 80  38 - 126 U/L Final   Total Bilirubin 03/25/2024 0.5  0.0 - 1.2 mg/dL Final   GFR, Estimated 03/25/2024 >60  >60 mL/min Final   Comment: (NOTE) Calculated using the CKD-EPI Creatinine Equation (2021)    Anion gap 03/25/2024 10  5 - 15 Final   Performed at [facility], [address] [place], [place] [address]   Hgb A1c MFr Bld 03/25/2024 5.2  4.8 - 5.6 % Final   Comment: (NOTE) Diagnosis of Diabetes The following HbA1c ranges recommended by the American Diabetes Association (ADA) may be used as an aid in the diagnosis of diabetes mellitus.  Hemoglobin             Suggested A1C NGSP%              Diagnosis  <5.7                   Non Diabetic  5.7-6.4                Pre-Diabetic  >6.4                   Diabetic  <7.0                   Glycemic control for                       adults with diabetes.     Mean Plasma Glucose 03/25/2024 102.54  mg/dL Final   Performed at [facility], [address], [place], [place] [address]   Magnesium  03/25/2024 1.9  1.7 - 2.4 mg/dL Final   Performed at [facility], [address] [place], [place] [address]   Alcohol, Ethyl (B) 03/25/2024 <15  <15 mg/dL Final   Comment: (NOTE) For medical purposes only. Performed at [facility], [address] [place], [place] [address]    Cholesterol 03/25/2024 180  0 - 200 mg/dL Final   Triglycerides 90/87/7974  171 (H)  <150 mg/dL Final   HDL 90/87/7974 45  >40 mg/dL Final   Total CHOL/HDL Ratio 03/25/2024 4.0  RATIO Final   VLDL 03/25/2024 34  0 - 40 mg/dL Final   LDL Cholesterol 03/25/2024 101 (H)  0 - 99 mg/dL Final   Comment:        Total Cholesterol/HDL:CHD Risk Coronary Heart Disease Risk Table                     Men   Women  1/2 Average Risk   3.4   3.3  Average Risk       5.0   4.4  2 X Average Risk   9.6   7.1  3 X Average Risk  23.4   11.0        Use the calculated Patient Ratio above and the CHD Risk Table to determine the patient's CHD Risk.        ATP III CLASSIFICATION (LDL):  <100     mg/dL   Optimal  899-870  mg/dL   Near or Above                    Optimal  130-159  mg/dL   Borderline  839-810  mg/dL   High  >809     mg/dL   Very High Performed at [facility], [address] [place],   [address]    TSH 03/25/2024 1.365  0.350 - 4.500 uIU/mL Final   Comment: Performed by a 3rd Generation assay with a functional sensitivity of <=0.01 uIU/mL. Performed at [facility], [address], [place], [place] [address]    POC Amphetamine UR 03/25/2024 None Detected  NONE DETECTED (Cut Off Level 1000 ng/mL) Final   POC Secobarbital (BAR) 03/25/2024 None Detected  NONE DETECTED (Cut Off Level 300 ng/mL) Final   POC Buprenorphine (BUP) 03/25/2024 None Detected  NONE DETECTED (Cut Off Level 10 ng/mL) Final   POC Oxazepam (BZO) 03/25/2024 None Detected  NONE DETECTED (Cut Off Level 300 ng/mL) Final   POC Cocaine UR 03/25/2024 Positive (A)  NONE DETECTED (Cut Off Level 300 ng/mL) Final   POC Methamphetamine UR 03/25/2024 None Detected  NONE DETECTED (Cut Off Level 1000 ng/mL) Final   POC Morphine 03/25/2024 None Detected  NONE DETECTED (Cut Off Level 300 ng/mL) Final   POC Methadone UR 03/25/2024 None Detected  NONE DETECTED (Cut Off Level 300 ng/mL) Final   POC Oxycodone UR 03/25/2024 None Detected  NONE DETECTED (Cut Off Level 100 ng/mL) Final   POC Marijuana  UR 03/25/2024 None Detected  NONE DETECTED (Cut Off Level 50 ng/mL) Final  Office Visit on 03/09/2024  Component Date Value Ref Range Status   Neisseria Gonorrhea 03/09/2024 Negative   Final   Chlamydia 03/09/2024 Negative   Final   Trichomonas 03/09/2024 Negative   Final   Comment 03/09/2024 Normal Reference Range Trichomonas - Negative   Final   Comment 03/09/2024 Normal Reference Ranger Chlamydia - Negative   Final   Comment 03/09/2024 Normal Reference Range Neisseria Gonorrhea - Negative   Final   HIV Screen 4th Generation wRfx 03/09/2024 Non Reactive  Non Reactive Final   Comment: HIV-1/HIV-2 antibodies and HIV-1 p24 antigen were NOT detected. There is no laboratory evidence of HIV infection. HIV Negative    RPR Ser Ql 03/09/2024 Non Reactive  Non Reactive Final    Blood Alcohol level:  Lab Results  Component Value Date   [facility] <15 05/06/2024   ETH <15 03/25/2024    Metabolic Disorder Labs: Lab Results  Component Value Date   HGBA1C 5.2 03/25/2024   MPG 102.54 03/25/2024   MPG 111 03/27/2023   No results found for: PROLACTIN Lab Results  Component Value Date   CHOL 180 03/25/2024   TRIG 171 (H) 03/25/2024   HDL 45 03/25/2024   CHOLHDL 4.0 03/25/2024   VLDL 34 03/25/2024   LDLCALC 101 (H) 03/25/2024   LDLCALC 118 (H) 03/27/2023    Therapeutic Lab Levels: No results found for: LITHIUM No results found for: VALPROATE No results found for: CBMZ  Physical Findings   AUDIT    Flowsheet Row ED from 08/04/2023 in [facility]  Alcohol Use Disorder Identification Test Final Score (AUDIT) 24   PHQ2-9    Flowsheet Row ED from 05/06/2024 in [facility] Most recent reading at 05/07/2024  3:57 PM ED from 03/25/2024 in [facility] Most recent reading at 03/25/2024  4:32 PM ED from 08/04/2023 in [facility] Most recent reading at 08/07/2023  8:11 AM ED  from 03/27/2023 in [facility] Most recent reading at 03/31/2023 10:18 AM ED from 03/27/2023 in [facility] Most recent reading at 03/27/2023 11:04 AM  PHQ-2 Total Score 1 4 0 3 3  PHQ-9 Total Score 2 14 --  12 13   Flowsheet Row ED from 05/06/2024 in [facility] Most recent reading at 05/08/2024  4:02 AM ED from 05/06/2024 in [facility] Most recent reading at 05/06/2024  4:45 PM ED from 04/24/2024 in [facility] Emergency Department at [facility] Most recent reading at 04/24/2024  5:36 PM  C-SSRS RISK CATEGORY Error: Q3, 4, or 5 should not be populated when Q2 is No Error: Q3, 4, or 5 should not be populated when Q2 is No No Risk     Musculoskeletal  Strength & Muscle Tone: within normal limits Gait & Station: normal Patient leans: N/A  Psychiatric Specialty Exam  Presentation  General Appearance:  Disheveled  Eye Contact: Fair  Speech: Clear and Coherent  Speech Volume: Normal  Handedness: Right   Mood and Affect  Mood: Anxious; Depressed  Affect: Congruent   Thought Process  Thought Processes: Coherent  Descriptions of Associations:Intact  Orientation:Full (Time, Place and Person)  Thought Content:Logical  Diagnosis of Schizophrenia or Schizoaffective disorder in past: No    Hallucinations:Hallucinations: None  Ideas of Reference:None  Suicidal Thoughts:Suicidal Thoughts: No  Homicidal Thoughts:Homicidal Thoughts: No   Sensorium  Memory: Immediate Fair  Judgment: Fair  Insight: Fair   Art Therapist  Concentration: Fair  Attention Span: Fair  Recall: Fair  Fund of Knowledge: Fair  Language: Fair   Psychomotor Activity  Psychomotor Activity: Psychomotor Activity: Normal   Assets  Assets: Resilience   Sleep  Sleep: Sleep: Fair  Estimated Sleeping Duration (Last 24 Hours): 11.00-12.75  hours  Nutritional Assessment (For OBS and FBC admissions only) Has the patient had a weight loss or gain of 10 pounds or more in the last 3 months?: No Has the patient had a decrease in food intake/or appetite?: No Does the patient have dental problems?: No Does the patient have eating habits or behaviors that may be indicators of an eating disorder including binging or inducing vomiting?: No Has the patient recently lost weight without trying?: 0 Has the patient been eating poorly because of a decreased appetite?: 0 Malnutrition Screening Tool Score: 0    Physical Exam  Vitals and nursing note reviewed.  Constitutional:      Appearance: Normal appearance.  HENT:     Head: Normocephalic.     Nose: Nose normal.  Eyes:     Extraocular Movements: Extraocular movements intact.  Cardiovascular:     Rate and Rhythm: Normal rate.  Pulmonary:     Effort: Pulmonary effort is normal.  Musculoskeletal:        General: Normal range of motion.     Cervical back: Normal range of motion.  Neurological:     General: No focal deficit present.     Mental Status: He is alert and oriented to person, place, and time.     Review of Systems  Constitutional: Negative.   HENT: Negative.    Eyes: Negative.   Respiratory: Negative.    Cardiovascular: Negative.   Gastrointestinal: Negative.   Genitourinary: Negative.   Musculoskeletal: Negative.   Neurological: Negative.   Endo/Heme/Allergies: Negative.   Psychiatric/Behavioral:  Positive for depression and substance abuse  Blood pressure 123/84, pulse 70, temperature 97.7 F (36.5 C), temperature source Oral, resp. rate 16, SpO2 100%. There is no height or weight on file to calculate BMI.  Long Term Goals: Improvement in symptoms so as ready for discharge   Short Term Goals: Patient will verbalize feelings in meetings with treatment team members., Patient  will attend at least of 50% of the groups daily., Pt will complete the PHQ9 on  admission, day 3 and discharge., Patient will participate in completing the [place] Suicide Severity Rating Scale, Patient will score a low risk of violence for 24 hours prior to discharge, and Patient will take medications as prescribed daily.  Treatment Plan Summary: Daily contact with patient to assess and evaluate symptoms and progress in treatment Patient admitted to the facility-based crisis unit for detox from alcohol and cocaine.  Patient reports drinking 3 40 ounce beers and 3-5 shots of liquor daily and last drink last night.  This is an increase from his alcohol intake last month.  Patient reports smoking half a gram to a gram of crack cocaine daily and last use last night.  Patient denies history of complicated alcohol withdrawal and history of seizures.  Patient is requesting residential substance abuse treatment and states that it was a mistake that he declined during last admission.  Denies current withdrawal symptoms.  Medications: - Changed trazodone  50 mg to nightly instead of as needed, as requested by patient - No other medication changes at this time. Meds ordered this encounter  Medications   acetaminophen  (TYLENOL ) tablet 650 mg   alum & mag hydroxide-simeth (MAALOX/MYLANTA) 200-200-20 MG/5ML suspension 30 mL   magnesium  hydroxide (MILK OF MAGNESIA) suspension 30 mL   multivitamin with minerals tablet 1 tablet   chlordiazePOXIDE (LIBRIUM) capsule 25 mg   hydrOXYzine  (ATARAX ) tablet 25 mg   loperamide  (IMODIUM ) capsule 2-4 mg   ondansetron  (ZOFRAN -ODT) disintegrating tablet 4 mg   DISCONTD: traZODone  (DESYREL ) tablet 50 mg   naltrexone  (DEPADE) tablet 25 mg   ibuprofen  (ADVIL ) tablet 600 mg   nicotine  (NICODERM CQ  - dosed in mg/24 hours) patch 14 mg   AND Linked Order Group    haloperidol  (HALDOL ) tablet 5 mg    diphenhydrAMINE  (BENADRYL ) capsule 50 mg   AND Linked Order Group    haloperidol  lactate (HALDOL ) injection 10 mg    diphenhydrAMINE  (BENADRYL ) injection 50  mg    LORazepam  (ATIVAN ) injection 2 mg   AND Linked Order Group    haloperidol  lactate (HALDOL ) injection 5 mg    diphenhydrAMINE  (BENADRYL ) injection 50 mg    LORazepam  (ATIVAN ) injection 2 mg   neomycin-bacitracin-polymyxin [phone] OINT 1 Application   traZODone  (DESYREL ) tablet 50 mg    Discharge Planning: Social work and case management to assist with discharge planning and identification of hospital follow-up needs prior to discharge.  Social work team has placed referrals to [facility] [employer] and [facility], awaiting responses likely tomorrow for updates.    Alan JAYSON Mcardle, NP 05/08/2024 2:42 PM

## 2024-05-08 NOTE — Group Note (Signed)
 Group Topic: Wellness  Group Date: 05/08/2024 Start Time: 2030 End Time: 2100 Facilitators: Anice Benton LABOR, NT  Department: [facility]  Number of Participants: 6  Group Focus: check in Treatment Modality:  Individual Therapy Interventions utilized were assignment Purpose: increase insight  Name: LATREL SZYMCZAK Date of Birth: 18-Aug-1985  MR: [id number]    Level of Participation: Did Not Attend  Quality of Participation: N/A Interactions with others: N/A Mood/Affect: N/A Triggers (if applicable): N/A Cognition: N/A Progress: N/A Response: N/A Plan: patient will be encouraged to attend groups   Patients Problems:  Patient Active Problem List   Diagnosis Date Noted   Alcohol use disorder 03/25/2024   Substance induced mood disorder (HCC) 01/16/2021   Alcohol abuse 01/16/2021   Cocaine use disorder, moderate, in controlled environment (HCC) 01/16/2021   Polysubstance abuse (HCC) 07/24/2011   Schizoaffective disorder, depressive type (HCC) 07/24/2011

## 2024-05-08 NOTE — ED Notes (Signed)
 Paitent provided dinner.

## 2024-05-08 NOTE — ED Notes (Signed)
 Paitent had breakfast.

## 2024-05-09 DIAGNOSIS — F14280 Cocaine dependence with cocaine-induced anxiety disorder: Secondary | ICD-10-CM | POA: Diagnosis not present

## 2024-05-09 DIAGNOSIS — F1020 Alcohol dependence, uncomplicated: Secondary | ICD-10-CM | POA: Diagnosis not present

## 2024-05-09 NOTE — ED Notes (Signed)
 Patient monitored throughout shift with no significant issues/stable presentation. Patient remained calm/cooperative on the unit. Oriented to person, place, and time.  Denies suicidal or homicidal ideation. No hallucinations or delusions reported or observed. Eating and fluid intake were adequate. No medication issues reported; patient compliant with prescribed regimen.

## 2024-05-09 NOTE — Group Note (Signed)
 Group Topic: Understanding Self  Group Date: 05/09/2024 Start Time: 0100 End Time: 0130 Facilitators: Lonzell Dwayne RAMAN, NT  Department: [facility]  Number of Participants: 3  Group Focus: chemical dependency issues Treatment Modality:  Psychoeducation Interventions utilized were patient education Purpose: increase insight  Name: [name] Date of Birth: [birth year]  MR: [id number]    Level of Participation: Patient did not attend group Quality of Participation: N/A Interactions with others: N/A Mood/Affect: N/A Triggers (if applicable): N/A Cognition: N/A Progress: N/A Response: N/A Plan: N/A  Patients Problems:  Patient Active Problem List   Diagnosis Date Noted   Alcohol use disorder 03/25/2024   Substance induced mood disorder (HCC) 01/16/2021   Alcohol abuse 01/16/2021   Cocaine use disorder, moderate, in controlled environment (HCC) 01/16/2021   Polysubstance abuse (HCC) 07/24/2011   Schizoaffective disorder, depressive type (HCC) 07/24/2011

## 2024-05-09 NOTE — Progress Notes (Signed)
 Patient is currently resting at this time with no acute distress noted.  Respirations present and unlabored.  No current issues noted at this time.

## 2024-05-09 NOTE — ED Notes (Signed)
 Patient remains on unit with no acute changes noted at this time. Alert and oriented.  Interacting with peers/staff appropriately. No current suicidal or homicidal ideation reported. No hallucinations or delusions observed. Patient is tolerating the milieu. Vitals within normal limits. Medications administered as ordered; no adverse effects noted refer to [facility]

## 2024-05-09 NOTE — Group Note (Signed)
 Group Topic: Healthy Self Image and Positive Change  Group Date: 05/09/2024 Start Time: 1200 End Time: 1230 Facilitators: Reinhold Minor BROCKS, RN  Department: [facility]  Number of Participants: 4  Group Focus: ways to care for sekf Treatment Modality:  psychoeducation Interventions utilized were group education Purpose: self care Level of Participation: moderate Quality of Participation: attentive and cooperative Interactions with others: gave feedback Mood/Affect: appropriate Triggers (if applicable): n/a Cognition: coherent/clear Progress: Gaining insight Response: Learned to care for self Plan: patient will be encouraged to attend future RN education groups  Name: Frederick Ballard Date of Birth: Jul 19, 1985  MR: [id number]     Patients Problems:  Patient Active Problem List   Diagnosis Date Noted   Alcohol use disorder 03/25/2024   Substance induced mood disorder (HCC) 01/16/2021   Alcohol abuse 01/16/2021   Cocaine use disorder, moderate, in controlled environment (HCC) 01/16/2021   Polysubstance abuse (HCC) 07/24/2011   Schizoaffective disorder, depressive type (HCC) 07/24/2011

## 2024-05-09 NOTE — Progress Notes (Signed)
 Patient is currently resting at this time with no acute distress noted.  Respirations present and unlabored.  No current issues noted.

## 2024-05-09 NOTE — Group Note (Signed)
 Group Topic: Social Support  Group Date: 05/09/2024 Start Time: 2000 End Time: 2030 Facilitators: Joan Plowman B  Department: [facility]  Number of Participants: 3  Group Focus: check in Treatment Modality:  Leisure Development Interventions utilized were support Purpose: express feelings, increase insight, and relapse prevention strategies  Name: Frederick Ballard Date of Birth: 1986/06/21  MR: [id number]    Level of Participation: active Quality of Participation: cooperative Interactions with others: gave feedback Mood/Affect: appropriate Triggers (if applicable): NA Cognition: coherent/clear Progress: Gaining insight Response: Pt is ready to leave tomorrow  Plan: patient will be encouraged to keep going to groups  Patients Problems:  Patient Active Problem List   Diagnosis Date Noted   Alcohol use disorder 03/25/2024   Substance induced mood disorder (HCC) 01/16/2021   Alcohol abuse 01/16/2021   Cocaine use disorder, moderate, in controlled environment (HCC) 01/16/2021   Polysubstance abuse (HCC) 07/24/2011   Schizoaffective disorder, depressive type (HCC) 07/24/2011

## 2024-05-09 NOTE — ED Notes (Addendum)
 Pt was in  AA group.

## 2024-05-09 NOTE — Care Management (Addendum)
 [facility] Care Management...  Writer met with the patient and discussed discharge planning.  Patient requests inpatient substance abuse treatment.    Patient has been referred to [facility] and [facility]   11:00 am  Writer followed up with [facility] Per Nanetta, referral has been received.   Writer provided patient with [facility]'s number [phone] to complete phone interview  Patient under review at [facility]   1:26 pm  Writer reached out to [facility]  Per Rosaline, patient's referral is under review   3:30 pm Writer received call from [facility].   Per Jordan, patient denied due to needing medication stabilization  4:25 pm Patient completed phone assessment with [facility] @ [facility]

## 2024-05-09 NOTE — Care Management (Deleted)
 [facility] Care Management...  Writer faxed referral to [facility]  Patient reported that after speaking with Provider that he would be going home and participating in AA virtually.   Patient reported that he needs to get back to work [employer] received call from from [place], Patient has been accepted to [facility]

## 2024-05-09 NOTE — ED Provider Notes (Signed)
 Behavioral Health Progress Note  Date and Time: 05/09/2024 8:31 AM Name: Frederick Ballard MRN:  [id number]  Frederick Ballard is a 38 year old male who presented to [facility] behavioral health on 05/02/2024 requesting substance use treatment.  Current substances of choice include alcohol and cocaine.  Using alcohol and cocaine daily for 3 months prior to arrival.  Subjective: Patient states I want to go to [facility] or [facility].  I went to [facility] back when it was COVID but we only stayed 30 days, I would like to go back there.  I want to get a better job and get on track.  Patient is assessed by this nurse practitioner face-to-face.  He is reclined upon my approach, appears asleep.  He is easily awakened.  Patient is alert and oriented, pleasant and cooperative during assessment.  He presents with euthymic mood, congruent affect.  Frederick Ballard is insightful today.  States I will go to any facility that accepts me.  Has discussed both [facility] and [facility] with disposition specialist earlier this date.  Patient reports previously graduated 30-day program at [facility] 2 years ago, unable to recall the length of continued sobriety.  Patient denies suicidal and homicidal ideation.  He denies history of suicide attempts, denies history of nonsuicidal self-harm behavior.  He reports history of I have made dangerous decisions with my substance use.  He denies auditory and visual hallucinations.  There is no evidence of delusional thought content no indication that patient is responding to internal stimuli.  Frederick Ballard is not linked with outpatient psychiatry.  Per chart review previous diagnoses include schizoaffective disorder, depressive type, substance-induced mood disorder, alcohol abuse and cocaine use disorder.  Patient is willing to consider individual counseling, refuses medications to address mood at this time.  Previous inpatient psychiatric admissions most recently admitted to [facility] behavioral health in January 2013.   No family mental health history reported.  Patient offered support and encouragement.  He verbalizes understanding of treatment plan.  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  Substance-induced mood disorder, alcohol abuse, cocaine use disorder, suicidal ideation Past Medical History: None reported Family History: None reported Family Psychiatric  History: None reported Social History: Patient resides with his sister in [place], he is employed in the product/process development scientist, history of alcohol use disorder, cocaine use disorder  Additional Social History:                         Sleep: Good  Appetite:  Fair  Current Medications:  Current Facility-Administered Medications  Medication Dose Route Frequency Provider Last Rate Last Admin   acetaminophen  (TYLENOL ) tablet 650 mg  650 mg Oral Q6H PRN Brent, Amanda C, NP       alum & mag hydroxide-simeth (MAALOX/MYLANTA) 200-200-20 MG/5ML suspension 30 mL  30 mL Oral Q4H PRN Brent, Amanda C, NP       chlordiazePOXIDE (LIBRIUM) capsule 25 mg  25 mg Oral Q6H PRN Brent, Amanda C, NP       haloperidol  (HALDOL ) tablet 5 mg  5 mg Oral TID PRN Brent, Amanda C, NP       And   diphenhydrAMINE  (BENADRYL ) capsule 50 mg  50 mg Oral TID PRN Brent, Amanda C, NP       haloperidol  lactate (HALDOL ) injection 10 mg  10 mg Intramuscular TID PRN Thresa,  Amanda C, NP       And   diphenhydrAMINE  (BENADRYL ) injection 50 mg  50 mg Intramuscular TID PRN Brent, Amanda C, NP       And   LORazepam  (ATIVAN ) injection 2 mg  2 mg Intramuscular TID PRN Brent, Amanda C, NP       haloperidol  lactate (HALDOL ) injection 5 mg  5 mg Intramuscular TID PRN Brent, Amanda C, NP       And   diphenhydrAMINE  (BENADRYL ) injection 50 mg  50 mg Intramuscular TID PRN Brent, Amanda C, NP        And   LORazepam  (ATIVAN ) injection 2 mg  2 mg Intramuscular TID PRN Brent, Amanda C, NP       hydrOXYzine  (ATARAX ) tablet 25 mg  25 mg Oral Q6H PRN Brent, Amanda C, NP   25 mg at 05/06/24 2118   ibuprofen  (ADVIL ) tablet 600 mg  600 mg Oral Q6H PRN Brent, Amanda C, NP       loperamide  (IMODIUM ) capsule 2-4 mg  2-4 mg Oral PRN Brent, Amanda C, NP       magnesium  hydroxide (MILK OF MAGNESIA) suspension 30 mL  30 mL Oral Daily PRN Brent, Amanda C, NP       multivitamin with minerals tablet 1 tablet  1 tablet Oral Daily Brent, Amanda C, NP   1 tablet at 05/08/24 0932   naltrexone  (DEPADE) tablet 25 mg  25 mg Oral Daily Brent, Amanda C, NP   25 mg at 05/08/24 0932   neomycin-bacitracin-polymyxin [phone] OINT 1 Application  1 Application Topical TID PRN Cole Kandi BROCKS, MD       nicotine  (NICODERM CQ  - dosed in mg/24 hours) patch 14 mg  14 mg Transdermal Daily Brent, Amanda C, NP   14 mg at 05/08/24 9066   ondansetron  (ZOFRAN -ODT) disintegrating tablet 4 mg  4 mg Oral Q6H PRN Brent, Amanda C, NP       traZODone  (DESYREL ) tablet 50 mg  50 mg Oral QHS Brent, Amanda C, NP   50 mg at 05/08/24 2116   Current Outpatient Medications  Medication Sig Dispense Refill   ibuprofen  (ADVIL ) 200 MG tablet Take 600 mg by mouth every 6 (six) hours as needed (For pain).     Multiple Vitamins-Minerals (B COMPLEX-C-E-ZINC ) tablet Take 1 tablet by mouth daily. (Patient not taking: Reported on 05/06/2024) 30 tablet 0   naltrexone  (DEPADE) 50 MG tablet Take 0.5 tablets (25 mg total) by mouth daily. (Patient not taking: Reported on 05/06/2024) 30 tablet 0   traZODone  (DESYREL ) 50 MG tablet Take 1 tablet (50 mg total) by mouth at bedtime. (Patient not taking: Reported on 05/06/2024) 30 tablet 0    Labs  Lab Results:  Admission on 05/06/2024, Discharged on 05/06/2024  Component Date Value Ref Range Status   WBC 05/06/2024 10.3  4.0 - 10.5 K/uL Final   RBC 05/06/2024 5.03  4.22 - 5.81 MIL/uL Final   Hemoglobin  05/06/2024 15.1  13.0 - 17.0 g/dL Final   HCT 89/75/7974 44.9  39.0 - 52.0 % Final   MCV 05/06/2024 89.3  80.0 - 100.0 fL Final   MCH 05/06/2024 30.0  26.0 - 34.0 pg Final   MCHC 05/06/2024 33.6  30.0 - 36.0 g/dL Final   RDW 89/75/7974 13.0  11.5 - 15.5 % Final   Platelets 05/06/2024 337  150 - 400 K/uL Final   nRBC 05/06/2024 0.0  0.0 - 0.2 % Final   Neutrophils Relative % 05/06/2024  46  % Final   Neutro Abs 05/06/2024 4.9  1.7 - 7.7 K/uL Final   Lymphocytes Relative 05/06/2024 42  % Final   Lymphs Abs 05/06/2024 4.3 (H)  0.7 - 4.0 K/uL Final   Monocytes Relative 05/06/2024 8  % Final   Monocytes Absolute 05/06/2024 0.9  0.1 - 1.0 K/uL Final   Eosinophils Relative 05/06/2024 2  % Final   Eosinophils Absolute 05/06/2024 0.2  0.0 - 0.5 K/uL Final   Basophils Relative 05/06/2024 1  % Final   Basophils Absolute 05/06/2024 0.1  0.0 - 0.1 K/uL Final   Immature Granulocytes 05/06/2024 1  % Final   Abs Immature Granulocytes 05/06/2024 0.05  0.00 - 0.07 K/uL Final   Performed at [facility], [address], [place], [place] [address]   Sodium 05/06/2024 137  135 - 145 mmol/L Final   Potassium 05/06/2024 4.5  3.5 - 5.1 mmol/L Final   Chloride 05/06/2024 105  98 - 111 mmol/L Final   CO2 05/06/2024 24  22 - 32 mmol/L Final   Glucose, Bld 05/06/2024 88  70 - 99 mg/dL Final   Glucose reference range applies only to samples taken after fasting for at least 8 hours.   BUN 05/06/2024 18  6 - 20 mg/dL Final   Creatinine, Ser 05/06/2024 1.06  0.61 - 1.24 mg/dL Final   Calcium 89/75/7974 9.4  8.9 - 10.3 mg/dL Final   Total Protein 89/75/7974 6.3 (L)  6.5 - 8.1 g/dL Final   Albumin 89/75/7974 3.8  3.5 - 5.0 g/dL Final   AST 89/75/7974 32  15 - 41 U/L Final   ALT 05/06/2024 35  0 - 44 U/L Final   Alkaline Phosphatase 05/06/2024 101  38 - 126 U/L Final   Total Bilirubin 05/06/2024 0.4  0.0 - 1.2 mg/dL Final   GFR, Estimated 05/06/2024 >60  >60 mL/min Final   Comment: (NOTE) Calculated using  the CKD-EPI Creatinine Equation (2021)    Anion gap 05/06/2024 8  5 - 15 Final   Performed at [facility], [address] [place], [place] [address]   Magnesium  05/06/2024 2.1  1.7 - 2.4 mg/dL Final   Performed at [facility], [address] [place], [place] [address]   Alcohol, Ethyl (B) 05/06/2024 <15  <15 mg/dL Final   Comment: (NOTE) For medical purposes only. Performed at [facility], [address] [place], [place] [address]    TSH 05/06/2024 0.996  0.350 - 4.500 uIU/mL Final   Comment: Performed by a 3rd Generation assay with a functional sensitivity of <=0.01 uIU/mL. Performed at [facility], [address] [place], [place] [address]    POC Amphetamine UR 05/06/2024 None Detected  NONE DETECTED (Cut Off Level 1000 ng/mL) Final   POC Secobarbital (BAR) 05/06/2024 None Detected  NONE DETECTED (Cut Off Level 300 ng/mL) Final   POC Buprenorphine (BUP) 05/06/2024 None Detected  NONE DETECTED (Cut Off Level 10 ng/mL) Final   POC Oxazepam (BZO) 05/06/2024 None Detected  NONE DETECTED (Cut Off Level 300 ng/mL) Final   POC Cocaine UR 05/06/2024 Positive (A)  NONE DETECTED (Cut Off Level 300 ng/mL) Final   POC Methamphetamine UR 05/06/2024 None Detected  NONE DETECTED (Cut Off Level 1000 ng/mL) Final   POC Morphine 05/06/2024 None Detected  NONE DETECTED (Cut Off Level 300 ng/mL) Final   POC Methadone UR 05/06/2024 None Detected  NONE DETECTED (Cut Off Level 300 ng/mL) Final  POC Oxycodone UR 05/06/2024 None Detected  NONE DETECTED (Cut Off Level 100 ng/mL) Final   POC Marijuana UR 05/06/2024 Positive (A)  NONE DETECTED (Cut Off Level 50 ng/mL) Final  Admission on 03/25/2024, Discharged on 03/25/2024  Component Date Value Ref Range Status   WBC 03/25/2024 6.9  4.0 - 10.5 K/uL Final   RBC 03/25/2024 4.43  4.22 - 5.81 MIL/uL Final   Hemoglobin 03/25/2024 13.4  13.0 - 17.0 g/dL Final   HCT 90/87/7974 40.8  39.0 - 52.0 % Final   MCV 03/25/2024 92.1  80.0 -  100.0 fL Final   MCH 03/25/2024 30.2  26.0 - 34.0 pg Final   MCHC 03/25/2024 32.8  30.0 - 36.0 g/dL Final   RDW 90/87/7974 13.0  11.5 - 15.5 % Final   Platelets 03/25/2024 306  150 - 400 K/uL Final   nRBC 03/25/2024 0.0  0.0 - 0.2 % Final   Neutrophils Relative % 03/25/2024 36  % Final   Neutro Abs 03/25/2024 2.5  1.7 - 7.7 K/uL Final   Lymphocytes Relative 03/25/2024 48  % Final   Lymphs Abs 03/25/2024 3.3  0.7 - 4.0 K/uL Final   Monocytes Relative 03/25/2024 12  % Final   Monocytes Absolute 03/25/2024 0.8  0.1 - 1.0 K/uL Final   Eosinophils Relative 03/25/2024 3  % Final   Eosinophils Absolute 03/25/2024 0.2  0.0 - 0.5 K/uL Final   Basophils Relative 03/25/2024 1  % Final   Basophils Absolute 03/25/2024 0.1  0.0 - 0.1 K/uL Final   Immature Granulocytes 03/25/2024 0  % Final   Abs Immature Granulocytes 03/25/2024 0.02  0.00 - 0.07 K/uL Final   Performed at [facility], [address], [place], [place] [address]   Sodium 03/25/2024 137  135 - 145 mmol/L Final   Potassium 03/25/2024 4.3  3.5 - 5.1 mmol/L Final   Chloride 03/25/2024 103  98 - 111 mmol/L Final   CO2 03/25/2024 24  22 - 32 mmol/L Final   Glucose, Bld 03/25/2024 87  70 - 99 mg/dL Final   Glucose reference range applies only to samples taken after fasting for at least 8 hours.   BUN 03/25/2024 12  6 - 20 mg/dL Final   Creatinine, Ser 03/25/2024 1.12  0.61 - 1.24 mg/dL Final   Calcium 90/87/7974 9.5  8.9 - 10.3 mg/dL Final   Total Protein 90/87/7974 6.6  6.5 - 8.1 g/dL Final   Albumin 90/87/7974 3.8  3.5 - 5.0 g/dL Final   AST 90/87/7974 24  15 - 41 U/L Final   ALT 03/25/2024 25  0 - 44 U/L Final   Alkaline Phosphatase 03/25/2024 80  38 - 126 U/L Final   Total Bilirubin 03/25/2024 0.5  0.0 - 1.2 mg/dL Final   GFR, Estimated 03/25/2024 >60  >60 mL/min Final   Comment: (NOTE) Calculated using the CKD-EPI Creatinine Equation (2021)    Anion gap 03/25/2024 10  5 - 15 Final   Performed at [facility],  [address] [place], [place] [address]   Hgb A1c MFr Bld 03/25/2024 5.2  4.8 - 5.6 % Final   Comment: (NOTE) Diagnosis of Diabetes The following HbA1c ranges recommended by the American Diabetes Association (ADA) may be used as an aid in the diagnosis of diabetes mellitus.  Hemoglobin             Suggested A1C NGSP%              Diagnosis  <  5.7                   Non Diabetic  5.7-6.4                Pre-Diabetic  >6.4                   Diabetic  <7.0                   Glycemic control for                       adults with diabetes.     Mean Plasma Glucose 03/25/2024 102.54  mg/dL Final   Performed at [facility], [address] [place], [place] [address]   Magnesium  03/25/2024 1.9  1.7 - 2.4 mg/dL Final   Performed at [facility], [address] [place], [place] [address]   Alcohol, Ethyl (B) 03/25/2024 <15  <15 mg/dL Final   Comment: (NOTE) For medical purposes only. Performed at [facility], [address] [place], [place] [address]    Cholesterol 03/25/2024 180  0 - 200 mg/dL Final   Triglycerides 90/87/7974 171 (H)  <150 mg/dL Final   HDL 90/87/7974 45  >40 mg/dL Final   Total CHOL/HDL Ratio 03/25/2024 4.0  RATIO Final   VLDL 03/25/2024 34  0 - 40 mg/dL Final   LDL Cholesterol 03/25/2024 101 (H)  0 - 99 mg/dL Final   Comment:        Total Cholesterol/HDL:CHD Risk Coronary Heart Disease Risk Table                     Men   Women  1/2 Average Risk   3.4   3.3  Average Risk       5.0   4.4  2 X Average Risk   9.6   7.1  3 X Average Risk  23.4   11.0        Use the calculated Patient Ratio above and the CHD Risk Table to determine the patient's CHD Risk.        ATP III CLASSIFICATION (LDL):  <100     mg/dL   Optimal  899-870  mg/dL   Near or Above                    Optimal  130-159  mg/dL   Borderline  839-810  mg/dL   High  >809     mg/dL   Very High Performed at [facility], [address] [place], [place] [address]    TSH  03/25/2024 1.365  0.350 - 4.500 uIU/mL Final   Comment: Performed by a 3rd Generation assay with a functional sensitivity of <=0.01 uIU/mL. Performed at [facility], [address], [place], [place] [address]    POC Amphetamine UR 03/25/2024 None Detected  NONE DETECTED (Cut Off Level 1000 ng/mL) Final   POC Secobarbital (BAR) 03/25/2024 None Detected  NONE DETECTED (Cut Off Level 300 ng/mL) Final   POC Buprenorphine (BUP) 03/25/2024 None Detected  NONE DETECTED (Cut Off Level 10 ng/mL) Final   POC Oxazepam (BZO) 03/25/2024 None Detected  NONE DETECTED (Cut Off Level 300 ng/mL) Final   POC Cocaine UR 03/25/2024 Positive (A)  NONE DETECTED (Cut Off Level 300 ng/mL) Final   POC Methamphetamine UR 03/25/2024 None Detected  NONE DETECTED (Cut Off Level 1000 ng/mL)  Final   POC Morphine 03/25/2024 None Detected  NONE DETECTED (Cut Off Level 300 ng/mL) Final   POC Methadone UR 03/25/2024 None Detected  NONE DETECTED (Cut Off Level 300 ng/mL) Final   POC Oxycodone UR 03/25/2024 None Detected  NONE DETECTED (Cut Off Level 100 ng/mL) Final   POC Marijuana UR 03/25/2024 None Detected  NONE DETECTED (Cut Off Level 50 ng/mL) Final  Office Visit on 03/09/2024  Component Date Value Ref Range Status   Neisseria Gonorrhea 03/09/2024 Negative   Final   Chlamydia 03/09/2024 Negative   Final   Trichomonas 03/09/2024 Negative   Final   Comment 03/09/2024 Normal Reference Range Trichomonas - Negative   Final   Comment 03/09/2024 Normal Reference Ranger Chlamydia - Negative   Final   Comment 03/09/2024 Normal Reference Range Neisseria Gonorrhea - Negative   Final   HIV Screen 4th Generation wRfx 03/09/2024 Non Reactive  Non Reactive Final   Comment: HIV-1/HIV-2 antibodies and HIV-1 p24 antigen were NOT detected. There is no laboratory evidence of HIV infection. HIV Negative    RPR Ser Ql 03/09/2024 Non Reactive  Non Reactive Final    Blood Alcohol level:  Lab Results  Component Value Date   [facility] <15  05/06/2024   ETH <15 03/25/2024    Metabolic Disorder Labs: Lab Results  Component Value Date   HGBA1C 5.2 03/25/2024   MPG 102.54 03/25/2024   MPG 111 03/27/2023   No results found for: PROLACTIN Lab Results  Component Value Date   CHOL 180 03/25/2024   TRIG 171 (H) 03/25/2024   HDL 45 03/25/2024   CHOLHDL 4.0 03/25/2024   VLDL 34 03/25/2024   LDLCALC 101 (H) 03/25/2024   LDLCALC 118 (H) 03/27/2023    Therapeutic Lab Levels: No results found for: LITHIUM No results found for: VALPROATE No results found for: CBMZ  Physical Findings   AUDIT    Flowsheet Row ED from 08/04/2023 in [facility]  Alcohol Use Disorder Identification Test Final Score (AUDIT) 24   PHQ2-9    Flowsheet Row ED from 05/06/2024 in [facility] Most recent reading at 05/09/2024  8:29 AM ED from 03/25/2024 in [facility] Most recent reading at 03/25/2024  4:32 PM ED from 08/04/2023 in [facility] Most recent reading at 08/07/2023  8:11 AM ED from 03/27/2023 in [facility] Most recent reading at 03/31/2023 10:18 AM ED from 03/27/2023 in [facility] Most recent reading at 03/27/2023 11:04 AM  PHQ-2 Total Score 3 4 0 3 3  PHQ-9 Total Score 12 14 -- 12 13   Flowsheet Row ED from 05/06/2024 in [facility] Most recent reading at 05/08/2024 10:35 PM ED from 05/06/2024 in [facility] Most recent reading at 05/06/2024  4:45 PM ED from 04/24/2024 in [facility] Emergency Department at [facility] Most recent reading at 04/24/2024  5:36 PM  C-SSRS RISK CATEGORY Error: Question 1 not populated Error: Q3, 4, or 5 should not be populated when Q2 is No No Risk     Musculoskeletal  Strength & Muscle Tone: within normal limits Gait & Station: normal Patient leans:  N/A  Psychiatric Specialty Exam  Presentation  General Appearance:  Disheveled  Eye Contact: Fair  Speech: Clear and Coherent  Speech Volume: Normal  Handedness: Right   Mood and Affect  Mood: Anxious; Depressed  Affect: Congruent   Thought  Process  Thought Processes: Coherent  Descriptions of Associations:Intact  Orientation:Full (Time, Place and Person)  Thought Content:Logical  Diagnosis of Schizophrenia or Schizoaffective disorder in past: No    Hallucinations:No data recorded Ideas of Reference:None  Suicidal Thoughts:No data recorded Homicidal Thoughts:No data recorded  Sensorium  Memory: Immediate Fair  Judgment: Fair  Insight: Fair   Art Therapist  Concentration: Fair  Attention Span: Fair  Recall: [employer] of Knowledge: Fair  Language: Fair   Psychomotor Activity  Psychomotor Activity:No data recorded  Assets  Assets: Resilience   Sleep  Sleep:No data recorded Estimated Sleeping Duration (Last 24 Hours): 11.00-12.75 hours  No data recorded  Physical Exam  Physical Exam Vitals and nursing note reviewed.  Constitutional:      Appearance: Normal appearance. He is well-developed.  HENT:     Head: Normocephalic and atraumatic.     Nose: Nose normal.  Cardiovascular:     Rate and Rhythm: Normal rate.  Pulmonary:     Effort: Pulmonary effort is normal.  Musculoskeletal:        General: Normal range of motion.     Cervical back: Normal range of motion.  Skin:    General: Skin is warm and dry.  Neurological:     Mental Status: He is alert and oriented to person, place, and time.  Psychiatric:        Attention and Perception: Attention and perception normal.        Mood and Affect: Mood and affect normal.        Speech: Speech normal.        Behavior: Behavior normal. Behavior is cooperative.        Thought Content: Thought content normal.        Cognition and Memory: Cognition and memory normal.         Judgment: Judgment normal.    Review of Systems  Constitutional: Negative.   HENT: Negative.    Eyes: Negative.   Respiratory: Negative.    Cardiovascular: Negative.   Gastrointestinal: Negative.   Genitourinary: Negative.   Musculoskeletal: Negative.   Skin: Negative.   Neurological: Negative.   Psychiatric/Behavioral:  Positive for substance abuse.    Blood pressure 114/66, pulse (!) 50, temperature 98.4 F (36.9 C), temperature source Oral, resp. rate 17, SpO2 100%. There is no height or weight on file to calculate BMI.  Treatment Plan Summary: UDS collected 05/06/2024 positive cocaine, positive marijuana.  BAL negative upon arrival. Daily contact with patient to assess and evaluate symptoms and progress in treatment Patient remains voluntary.  Requests residential substance use treatment.  Ellouise LITTIE Dawn, FNP 05/09/2024 8:31 AM

## 2024-05-09 NOTE — ED Notes (Signed)
 Patient is in the Dayroom calm and composed watching TV with  other patients.NAD. Respirations even and unlabored. Will monitor for safety.

## 2024-05-10 DIAGNOSIS — F14280 Cocaine dependence with cocaine-induced anxiety disorder: Secondary | ICD-10-CM | POA: Diagnosis not present

## 2024-05-10 DIAGNOSIS — F1020 Alcohol dependence, uncomplicated: Secondary | ICD-10-CM | POA: Diagnosis not present

## 2024-05-10 NOTE — ED Notes (Signed)
 Pt continues to present as flat, depressed, and guarded, but cooperative. Pt denies si hi and avh- verbal contract for safety provided. Pt denies physical pain and discomforts. Pt ate breakfast. Pt says he feels 'fine'.

## 2024-05-10 NOTE — Group Note (Signed)
 Group Topic: Social Support  Group Date: 05/10/2024 Start Time: 2000 End Time: 2030 Facilitators: Joan Plowman B  Department: [facility]  Number of Participants: 5  Group Focus: acceptance and check in Treatment Modality:  Individual Therapy Interventions utilized were leisure development, problem solving, and support Purpose: express feelings  Name: RAIF CHACHERE Date of Birth: January 07, 1986  MR: [id number]    Level of Participation: active Quality of Participation: attentive and cooperative Interactions with others: gave feedback Mood/Affect: appropriate Triggers (if applicable): NA Cognition: coherent/clear Progress: Gaining insight Response: NA Plan: patient will be encouraged to keep going to groups  Patients Problems:  Patient Active Problem List   Diagnosis Date Noted   Alcohol use disorder 03/25/2024   Substance induced mood disorder (HCC) 01/16/2021   Alcohol abuse 01/16/2021   Cocaine use disorder, moderate, in controlled environment (HCC) 01/16/2021   Polysubstance abuse (HCC) 07/24/2011   Schizoaffective disorder, depressive type (HCC) 07/24/2011

## 2024-05-10 NOTE — ED Notes (Signed)
 Pt c/o level 6/10 headache. Pt administered prn ibu as requested. RN also encouraged hydration

## 2024-05-10 NOTE — ED Notes (Signed)
 Pt asleep at this time,NAD.  Will continue to monitor for safety.

## 2024-05-10 NOTE — ED Notes (Signed)
 Patient sleeping in the bedroom composed, NAD. Q15 mins checks in place. Will monitor for safety.

## 2024-05-10 NOTE — ED Provider Notes (Signed)
 Behavioral Health Progress Note  Date and Time: 05/10/2024 11:52 AM Name: Frederick Ballard MRN:  [id number]  HPI: Deante Blough is a 38 year old male who presented to [facility] behavioral health on 05/02/2024 requesting substance use treatment.  Current substances of choice include alcohol and cocaine.  Using alcohol and cocaine daily for 3 months prior to arrival.  Patient assessment, 05/10/2024: On assessment today, the pt reports that their mood is continuing to improve on a day-by-day basis.  Patient reports excitement, stating that he is looking forward to going to [facility] on Wednesday, states that this is what CSW has informed him that he has been accepted to.  States that he is looking forward to maintaining his sobriety, in an effort to regaining his life back.  As of this moment, there is no documentation from CSW suggesting that patient has been accepted into this facility as of yet.  Documentation just points to the fact that referrals have been sent, and so far [facility] has denied patient, due to requiring medical stabilization.  Other facilities have not yet responded. Reports that anxiety is stable at this time. Sleep is fair as per patient. Appetite is good. Concentration is good Energy level is without complaints today Patient denies suicidal thoughts.  Denies suicidal intent and plan.  Denies having any HI.  Denies having psychotic symptoms.  He specifically denies auditory and visual hallucinations, denies delusional thinking, denies paranoia, there are no overt signs of psychosis, patient denies first rank symptoms.  He does not seem to be responding to any internal or external stimuli.  Denies having side effects to current psychiatric medications.  We discussed keeping medications the same with no changes to current medication regimen. Discussed the following psychosocial stressors: Homelessness as well as recurrent substance abuse. Empathy and active listening was provided.  Positive reinforcements given to keep working with CSW to ensure that he gets into substance abuse rehab.    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  Substance-induced mood disorder, alcohol abuse, cocaine use disorder, suicidal ideation Past Medical History: None reported Family History: None reported Family Psychiatric  History: None reported Social History: Patient resides with his sister in [place], he is employed in the product/process development scientist, history of alcohol use disorder, cocaine use disorder ep: Good  Appetite:  Fair  Current Medications:  Current Facility-Administered Medications  Medication Dose Route Frequency Provider Last Rate Last Admin   acetaminophen  (TYLENOL ) tablet 650 mg  650 mg Oral Q6H PRN Brent, Amanda C, NP   650 mg at 05/09/24 0850   alum & mag hydroxide-simeth (MAALOX/MYLANTA) 200-200-20 MG/5ML suspension 30 mL  30 mL Oral Q4H PRN Brent, Amanda C, NP       haloperidol  (HALDOL ) tablet 5 mg  5 mg Oral TID PRN Brent, Amanda C, NP       And   diphenhydrAMINE  (BENADRYL ) capsule 50 mg  50 mg Oral TID PRN Brent, Amanda C, NP       haloperidol  lactate (HALDOL ) injection 10 mg  10 mg Intramuscular TID PRN Brent, Amanda C, NP       And   diphenhydrAMINE  (BENADRYL ) injection 50 mg  50 mg Intramuscular TID PRN Thresa Alan BROCKS, NP       And   LORazepam  (ATIVAN ) injection 2 mg  2 mg Intramuscular TID PRN Brent, Amanda C, NP  haloperidol  lactate (HALDOL ) injection 5 mg  5 mg Intramuscular TID PRN Brent, Amanda C, NP       And   diphenhydrAMINE  (BENADRYL ) injection 50 mg  50 mg Intramuscular TID PRN Brent, Amanda C, NP       And   LORazepam  (ATIVAN ) injection 2 mg  2 mg Intramuscular TID PRN Brent, Amanda C, NP       ibuprofen  (ADVIL ) tablet 600 mg  600 mg Oral Q6H PRN Brent,  Amanda C, NP   600 mg at 05/09/24 2117   magnesium  hydroxide (MILK OF MAGNESIA) suspension 30 mL  30 mL Oral Daily PRN Brent, Amanda C, NP       multivitamin with minerals tablet 1 tablet  1 tablet Oral Daily Brent, Amanda C, NP   1 tablet at 05/10/24 9045   naltrexone  (DEPADE) tablet 25 mg  25 mg Oral Daily Brent, Amanda C, NP   25 mg at 05/10/24 9045   neomycin-bacitracin-polymyxin [phone] OINT 1 Application  1 Application Topical TID PRN Cole Kandi BROCKS, MD       nicotine  (NICODERM CQ  - dosed in mg/24 hours) patch 14 mg  14 mg Transdermal Daily Brent, Amanda C, NP   14 mg at 05/10/24 9043   traZODone  (DESYREL ) tablet 50 mg  50 mg Oral QHS Brent, Amanda C, NP   50 mg at 05/09/24 2117   Current Outpatient Medications  Medication Sig Dispense Refill   ibuprofen  (ADVIL ) 200 MG tablet Take 600 mg by mouth every 6 (six) hours as needed (For pain).     Multiple Vitamins-Minerals (B COMPLEX-C-E-ZINC ) tablet Take 1 tablet by mouth daily. (Patient not taking: Reported on 05/06/2024) 30 tablet 0   naltrexone  (DEPADE) 50 MG tablet Take 0.5 tablets (25 mg total) by mouth daily. (Patient not taking: Reported on 05/06/2024) 30 tablet 0   traZODone  (DESYREL ) 50 MG tablet Take 1 tablet (50 mg total) by mouth at bedtime. (Patient not taking: Reported on 05/06/2024) 30 tablet 0    Labs  Lab Results:  Admission on 05/06/2024, Discharged on 05/06/2024  Component Date Value Ref Range Status   WBC 05/06/2024 10.3  4.0 - 10.5 K/uL Final   RBC 05/06/2024 5.03  4.22 - 5.81 MIL/uL Final   Hemoglobin 05/06/2024 15.1  13.0 - 17.0 g/dL Final   HCT 89/75/7974 44.9  39.0 - 52.0 % Final   MCV 05/06/2024 89.3  80.0 - 100.0 fL Final   MCH 05/06/2024 30.0  26.0 - 34.0 pg Final   MCHC 05/06/2024 33.6  30.0 - 36.0 g/dL Final   RDW 89/75/7974 13.0  11.5 - 15.5 % Final   Platelets 05/06/2024 337  150 - 400 K/uL Final   nRBC 05/06/2024 0.0  0.0 - 0.2 % Final   Neutrophils Relative % 05/06/2024 46  % Final   Neutro  Abs 05/06/2024 4.9  1.7 - 7.7 K/uL Final   Lymphocytes Relative 05/06/2024 42  % Final   Lymphs Abs 05/06/2024 4.3 (H)  0.7 - 4.0 K/uL Final   Monocytes Relative 05/06/2024 8  % Final   Monocytes Absolute 05/06/2024 0.9  0.1 - 1.0 K/uL Final   Eosinophils Relative 05/06/2024 2  % Final   Eosinophils Absolute 05/06/2024 0.2  0.0 - 0.5 K/uL Final   Basophils Relative 05/06/2024 1  % Final   Basophils Absolute 05/06/2024 0.1  0.0 - 0.1 K/uL Final   Immature Granulocytes 05/06/2024 1  % Final   Abs Immature Granulocytes 05/06/2024 0.05  0.00 -  0.07 K/uL Final   Performed at [facility], [address], [place], [place] [address]   Sodium 05/06/2024 137  135 - 145 mmol/L Final   Potassium 05/06/2024 4.5  3.5 - 5.1 mmol/L Final   Chloride 05/06/2024 105  98 - 111 mmol/L Final   CO2 05/06/2024 24  22 - 32 mmol/L Final   Glucose, Bld 05/06/2024 88  70 - 99 mg/dL Final   Glucose reference range applies only to samples taken after fasting for at least 8 hours.   BUN 05/06/2024 18  6 - 20 mg/dL Final   Creatinine, Ser 05/06/2024 1.06  0.61 - 1.24 mg/dL Final   Calcium 89/75/7974 9.4  8.9 - 10.3 mg/dL Final   Total Protein 89/75/7974 6.3 (L)  6.5 - 8.1 g/dL Final   Albumin 89/75/7974 3.8  3.5 - 5.0 g/dL Final   AST 89/75/7974 32  15 - 41 U/L Final   ALT 05/06/2024 35  0 - 44 U/L Final   Alkaline Phosphatase 05/06/2024 101  38 - 126 U/L Final   Total Bilirubin 05/06/2024 0.4  0.0 - 1.2 mg/dL Final   GFR, Estimated 05/06/2024 >60  >60 mL/min Final   Comment: (NOTE) Calculated using the CKD-EPI Creatinine Equation (2021)    Anion gap 05/06/2024 8  5 - 15 Final   Performed at [facility], [address] [place], [place] [address]   Magnesium  05/06/2024 2.1  1.7 - 2.4 mg/dL Final   Performed at [facility], [address], [place], [place] [address]   Alcohol, Ethyl (B) 05/06/2024 <15  <15 mg/dL Final   Comment: (NOTE) For medical purposes only. Performed at [facility], [address], [place], [place] [address]    TSH 05/06/2024 0.996  0.350 - 4.500 uIU/mL Final   Comment: Performed by a 3rd Generation assay with a functional sensitivity of <=0.01 uIU/mL. Performed at [facility], [address] [place], [place] [address]    POC Amphetamine UR 05/06/2024 None Detected  NONE DETECTED (Cut Off Level 1000 ng/mL) Final   POC Secobarbital (BAR) 05/06/2024 None Detected  NONE DETECTED (Cut Off Level 300 ng/mL) Final   POC Buprenorphine (BUP) 05/06/2024 None Detected  NONE DETECTED (Cut Off Level 10 ng/mL) Final   POC Oxazepam (BZO) 05/06/2024 None Detected  NONE DETECTED (Cut Off Level 300 ng/mL) Final   POC Cocaine UR 05/06/2024 Positive (A)  NONE DETECTED (Cut Off Level 300 ng/mL) Final   POC Methamphetamine UR 05/06/2024 None Detected  NONE DETECTED (Cut Off Level 1000 ng/mL) Final   POC Morphine 05/06/2024 None Detected  NONE DETECTED (Cut Off Level 300 ng/mL) Final   POC Methadone UR 05/06/2024 None Detected  NONE DETECTED (Cut Off Level 300 ng/mL) Final   POC Oxycodone UR 05/06/2024 None Detected  NONE DETECTED (Cut Off Level 100 ng/mL) Final   POC Marijuana UR 05/06/2024 Positive (A)  NONE DETECTED (Cut Off Level 50 ng/mL) Final  Admission on 03/25/2024, Discharged on 03/25/2024  Component Date Value Ref Range Status   WBC 03/25/2024 6.9  4.0 - 10.5 K/uL Final   RBC 03/25/2024 4.43  4.22 - 5.81 MIL/uL Final   Hemoglobin 03/25/2024 13.4  13.0 - 17.0 g/dL Final   HCT 90/87/7974 40.8  39.0 - 52.0 % Final   MCV 03/25/2024 92.1  80.0 - 100.0 fL Final   MCH 03/25/2024 30.2  26.0 - 34.0 pg Final   MCHC 03/25/2024 32.8  30.0 -  36.0 g/dL Final   RDW 90/87/7974 13.0  11.5 - 15.5 % Final   Platelets 03/25/2024 306  150 - 400 K/uL Final   nRBC 03/25/2024 0.0  0.0 - 0.2 % Final   Neutrophils Relative % 03/25/2024 36  % Final   Neutro Abs 03/25/2024 2.5  1.7 - 7.7 K/uL Final   Lymphocytes Relative 03/25/2024 48  % Final   Lymphs Abs 03/25/2024  3.3  0.7 - 4.0 K/uL Final   Monocytes Relative 03/25/2024 12  % Final   Monocytes Absolute 03/25/2024 0.8  0.1 - 1.0 K/uL Final   Eosinophils Relative 03/25/2024 3  % Final   Eosinophils Absolute 03/25/2024 0.2  0.0 - 0.5 K/uL Final   Basophils Relative 03/25/2024 1  % Final   Basophils Absolute 03/25/2024 0.1  0.0 - 0.1 K/uL Final   Immature Granulocytes 03/25/2024 0  % Final   Abs Immature Granulocytes 03/25/2024 0.02  0.00 - 0.07 K/uL Final   Performed at [facility], [address] [place], [place] [address]   Sodium 03/25/2024 137  135 - 145 mmol/L Final   Potassium 03/25/2024 4.3  3.5 - 5.1 mmol/L Final   Chloride 03/25/2024 103  98 - 111 mmol/L Final   CO2 03/25/2024 24  22 - 32 mmol/L Final   Glucose, Bld 03/25/2024 87  70 - 99 mg/dL Final   Glucose reference range applies only to samples taken after fasting for at least 8 hours.   BUN 03/25/2024 12  6 - 20 mg/dL Final   Creatinine, Ser 03/25/2024 1.12  0.61 - 1.24 mg/dL Final   Calcium 90/87/7974 9.5  8.9 - 10.3 mg/dL Final   Total Protein 90/87/7974 6.6  6.5 - 8.1 g/dL Final   Albumin 90/87/7974 3.8  3.5 - 5.0 g/dL Final   AST 90/87/7974 24  15 - 41 U/L Final   ALT 03/25/2024 25  0 - 44 U/L Final   Alkaline Phosphatase 03/25/2024 80  38 - 126 U/L Final   Total Bilirubin 03/25/2024 0.5  0.0 - 1.2 mg/dL Final   GFR, Estimated 03/25/2024 >60  >60 mL/min Final   Comment: (NOTE) Calculated using the CKD-EPI Creatinine Equation (2021)    Anion gap 03/25/2024 10  5 - 15 Final   Performed at [facility], [address] [place], [place] [address]   Hgb A1c MFr Bld 03/25/2024 5.2  4.8 - 5.6 % Final   Comment: (NOTE) Diagnosis of Diabetes The following HbA1c ranges recommended by the American Diabetes Association (ADA) may be used as an aid in the diagnosis of diabetes mellitus.  Hemoglobin             Suggested A1C NGSP%              Diagnosis  <5.7                   Non Diabetic  5.7-6.4                 Pre-Diabetic  >6.4                   Diabetic  <7.0                   Glycemic control for                       adults with diabetes.     Mean Plasma Glucose 03/25/2024 102.54  mg/dL  Final   Performed at [facility], [address], [place], [place] [address]   Magnesium  03/25/2024 1.9  1.7 - 2.4 mg/dL Final   Performed at [facility], [address] [place], [place] [address]   Alcohol, Ethyl (B) 03/25/2024 <15  <15 mg/dL Final   Comment: (NOTE) For medical purposes only. Performed at [facility], [address], [place], [place] [address]    Cholesterol 03/25/2024 180  0 - 200 mg/dL Final   Triglycerides 90/87/7974 171 (H)  <150 mg/dL Final   HDL 90/87/7974 45  >40 mg/dL Final   Total CHOL/HDL Ratio 03/25/2024 4.0  RATIO Final   VLDL 03/25/2024 34  0 - 40 mg/dL Final   LDL Cholesterol 03/25/2024 101 (H)  0 - 99 mg/dL Final   Comment:        Total Cholesterol/HDL:CHD Risk Coronary Heart Disease Risk Table                     Men   Women  1/2 Average Risk   3.4   3.3  Average Risk       5.0   4.4  2 X Average Risk   9.6   7.1  3 X Average Risk  23.4   11.0        Use the calculated Patient Ratio above and the CHD Risk Table to determine the patient's CHD Risk.        ATP III CLASSIFICATION (LDL):  <100     mg/dL   Optimal  899-870  mg/dL   Near or Above                    Optimal  130-159  mg/dL   Borderline  839-810  mg/dL   High  >809     mg/dL   Very High Performed at [facility], [address], [place], [place] [address]    TSH 03/25/2024 1.365  0.350 - 4.500 uIU/mL Final   Comment: Performed by a 3rd Generation assay with a functional sensitivity of <=0.01 uIU/mL. Performed at [facility], [address], [place], [place] [address]    POC Amphetamine UR 03/25/2024 None Detected  NONE DETECTED (Cut Off Level 1000 ng/mL) Final   POC Secobarbital (BAR) 03/25/2024 None Detected  NONE DETECTED (Cut Off Level 300 ng/mL) Final    POC Buprenorphine (BUP) 03/25/2024 None Detected  NONE DETECTED (Cut Off Level 10 ng/mL) Final   POC Oxazepam (BZO) 03/25/2024 None Detected  NONE DETECTED (Cut Off Level 300 ng/mL) Final   POC Cocaine UR 03/25/2024 Positive (A)  NONE DETECTED (Cut Off Level 300 ng/mL) Final   POC Methamphetamine UR 03/25/2024 None Detected  NONE DETECTED (Cut Off Level 1000 ng/mL) Final   POC Morphine 03/25/2024 None Detected  NONE DETECTED (Cut Off Level 300 ng/mL) Final   POC Methadone UR 03/25/2024 None Detected  NONE DETECTED (Cut Off Level 300 ng/mL) Final   POC Oxycodone UR 03/25/2024 None Detected  NONE DETECTED (Cut Off Level 100 ng/mL) Final   POC Marijuana UR 03/25/2024 None Detected  NONE DETECTED (Cut Off Level 50 ng/mL) Final  Office Visit on 03/09/2024  Component Date Value Ref Range Status   Neisseria Gonorrhea 03/09/2024 Negative   Final   Chlamydia 03/09/2024 Negative   Final   Trichomonas 03/09/2024 Negative   Final   Comment 03/09/2024 Normal Reference Range Trichomonas - Negative   Final  Comment 03/09/2024 Normal Reference Ranger Chlamydia - Negative   Final   Comment 03/09/2024 Normal Reference Range Neisseria Gonorrhea - Negative   Final   HIV Screen 4th Generation wRfx 03/09/2024 Non Reactive  Non Reactive Final   Comment: HIV-1/HIV-2 antibodies and HIV-1 p24 antigen were NOT detected. There is no laboratory evidence of HIV infection. HIV Negative    RPR Ser Ql 03/09/2024 Non Reactive  Non Reactive Final    Blood Alcohol level:  Lab Results  Component Value Date   [facility] <15 05/06/2024   ETH <15 03/25/2024    Metabolic Disorder Labs: Lab Results  Component Value Date   HGBA1C 5.2 03/25/2024   MPG 102.54 03/25/2024   MPG 111 03/27/2023   No results found for: PROLACTIN Lab Results  Component Value Date   CHOL 180 03/25/2024   TRIG 171 (H) 03/25/2024   HDL 45 03/25/2024   CHOLHDL 4.0 03/25/2024   VLDL 34 03/25/2024   LDLCALC 101 (H) 03/25/2024   LDLCALC 118 (H)  03/27/2023    Therapeutic Lab Levels: No results found for: LITHIUM No results found for: VALPROATE No results found for: CBMZ  Physical Findings   AUDIT    Flowsheet Row ED from 08/04/2023 in [facility]  Alcohol Use Disorder Identification Test Final Score (AUDIT) 24   PHQ2-9    Flowsheet Row ED from 05/06/2024 in [facility] Most recent reading at 05/09/2024  8:29 AM ED from 03/25/2024 in [facility] Most recent reading at 03/25/2024  4:32 PM ED from 08/04/2023 in [facility] Most recent reading at 08/07/2023  8:11 AM ED from 03/27/2023 in [facility] Most recent reading at 03/31/2023 10:18 AM ED from 03/27/2023 in [facility] Most recent reading at 03/27/2023 11:04 AM  PHQ-2 Total Score 3 4 0 3 3  PHQ-9 Total Score 12 14 -- 12 13   Flowsheet Row ED from 05/06/2024 in [facility] Most recent reading at 05/10/2024  5:52 AM ED from 05/06/2024 in [facility] Most recent reading at 05/06/2024  4:45 PM ED from 04/24/2024 in [facility] at [facility] Most recent reading at 04/24/2024  5:36 PM  C-SSRS RISK CATEGORY Error: Question 6 not populated Error: Q3, 4, or 5 should not be populated when Q2 is No No Risk     Musculoskeletal  Strength & Muscle Tone: within normal limits Gait & Station: normal Patient leans: N/A  Psychiatric Specialty Exam  Presentation  General Appearance:  Casual  Eye Contact: Fair  Speech: Clear and Coherent  Speech Volume: Normal  Handedness: Right   Mood and Affect  Mood: Depressed  Affect: Congruent   Thought Process  Thought Processes: Coherent  Descriptions of Associations:Intact  Orientation:Full (Time, Place and Person)  Thought Content:Logical  Diagnosis of  Schizophrenia or Schizoaffective disorder in past: No    Hallucinations:No data recorded Ideas of Reference:None  Suicidal Thoughts:Suicidal Thoughts: No  Homicidal Thoughts:Homicidal Thoughts: No   Sensorium  Memory: Immediate Fair  Judgment: Fair  Insight: Fair   Art Therapist  Concentration: Fair  Attention Span: Fair  Recall: [employer] of Knowledge: Fair  Language: Fair   Psychomotor Activity  Psychomotor Activity:Psychomotor Activity: Decreased   Assets  Assets: Communication Skills   Sleep  Sleep:Sleep: Fair  Estimated Sleeping Duration (Last 24 Hours): 11.00-12.00 hours  Nutritional Assessment (For OBS and FBC  admissions only) Has the patient had a weight loss or gain of 10 pounds or more in the last 3 months?: No Has the patient had a decrease in food intake/or appetite?: No Does the patient have dental problems?: No Does the patient have eating habits or behaviors that may be indicators of an eating disorder including binging or inducing vomiting?: No Has the patient recently lost weight without trying?: 0    Physical Exam  Physical Exam Vitals and nursing note reviewed.  Constitutional:      Appearance: Normal appearance. He is well-developed.  HENT:     Head: Normocephalic and atraumatic.     Nose: Nose normal.  Cardiovascular:     Rate and Rhythm: Normal rate.  Pulmonary:     Effort: Pulmonary effort is normal.  Musculoskeletal:        General: Normal range of motion.     Cervical back: Normal range of motion.  Skin:    General: Skin is warm and dry.  Neurological:     Mental Status: He is alert and oriented to person, place, and time.  Psychiatric:        Attention and Perception: Attention and perception normal.        Mood and Affect: Mood and affect normal.        Speech: Speech normal.        Behavior: Behavior normal. Behavior is cooperative.        Thought Content: Thought content normal.        Cognition  and Memory: Cognition and memory normal.        Judgment: Judgment normal.    Review of Systems  Constitutional: Negative.   HENT: Negative.    Eyes: Negative.   Respiratory: Negative.    Cardiovascular: Negative.   Gastrointestinal: Negative.   Genitourinary: Negative.   Musculoskeletal: Negative.   Skin: Negative.   Neurological: Negative.   Psychiatric/Behavioral:  Positive for depression and substance abuse. Negative for hallucinations, memory loss and suicidal ideas. The patient is nervous/anxious. The patient does not have insomnia.   All other systems reviewed and are negative.  Blood pressure 106/74, pulse 77, temperature 97.7 F (36.5 C), temperature source Oral, resp. rate 17, SpO2 100%. There is no height or weight on file to calculate BMI.  Treatment Plan Summary: UDS collected 05/06/2024 positive cocaine, positive marijuana.  BAL negative upon arrival. Daily contact with patient to assess and evaluate symptoms and progress in treatment Patient remains voluntary.  Requests residential substance use treatment.  Donia Snell, NP 05/10/2024 11:52 AM

## 2024-05-10 NOTE — ED Notes (Signed)
 Paitent provided lunch.

## 2024-05-10 NOTE — ED Notes (Signed)
 Pt continues to present as flat, calm. Pt currently attending MHT group, appears to be engaged, no distress observed. Pt ate lunch.

## 2024-05-10 NOTE — ED Notes (Signed)
 Paitent provided breakfast.

## 2024-05-10 NOTE — Group Note (Signed)
 Group Topic: Wellness  Group Date: 05/10/2024 Start Time: 1200 End Time: 1230 Facilitators: Daved Tinnie HERO, RN  Department: [facility]  Number of Participants: 3  Group Focus: check in Treatment Modality:  Psychoeducation Interventions utilized were patient education Purpose: increase insight  Name: AZHAR KNOPE Date of Birth: 1986-01-05  MR: [id number]    Level of Participation: moderate Quality of Participation: attentive and cooperative Interactions with others: gave feedback Mood/Affect: appropriate Triggers (if applicable): n/a Cognition: coherent/clear Progress: Gaining insight Response: pt reports to feel 'fine', medications reviewed, questions denied Plan: patient will be encouraged to attend future RN check-in and education groups  Patients Problems:  Patient Active Problem List   Diagnosis Date Noted   Alcohol use disorder 03/25/2024   Substance induced mood disorder (HCC) 01/16/2021   Alcohol abuse 01/16/2021   Cocaine use disorder, moderate, in controlled environment (HCC) 01/16/2021   Polysubstance abuse (HCC) 07/24/2011   Schizoaffective disorder, depressive type (HCC) 07/24/2011

## 2024-05-10 NOTE — ED Notes (Signed)
 Paitent provided dinner.

## 2024-05-10 NOTE — Discharge Instructions (Signed)
 [facility] Care Management...  Writer provided patient with [facility] Arts Development Officer received call from [facility]  Per [place] @ [facility] patient has been accepted into their residential program  [facility]) [address]  [place], [place] [address] [phone]  Patient has an appointment scheduled for Thursday 05/12/24 @ 10:00 am.  Patient can discharge Thursday 05/12/2024 by 9:00 am   Patient will need 7 day supply and scripts  RN to arrange transportation

## 2024-05-10 NOTE — ED Notes (Signed)
 Pt watching tv in dayroom, no distress observed. Pt continues to present as guarded, avoids eye contact but polite. No further c/o headache this shift.

## 2024-05-10 NOTE — Care Management (Signed)
 [facility] Care Management...  Writer received call from [facility]  Per [place] @ [facility] patient has been accepted into their residential program  [facility]) [address]  [place], [place] [address] [phone]  Patient has an appointment scheduled for Thursday 05/12/24 @ 10:00 am.  Patient can discharge Thursday 05/12/2024 by 9:00 am   Patient will need 7 day supply and scripts  RN to arrange transportation

## 2024-05-10 NOTE — Group Note (Signed)
 Group Topic: Positive Affirmations  Group Date: 05/10/2024 Start Time: 1300 End Time: 1330 Facilitators: Veverly Oddis BRAVO, NT  Department: [facility]  Number of Participants: 4  Group Focus: check in, clarity of thought, and self-awareness Treatment Modality:  Cognitive Behavioral Therapy Interventions utilized were assignment Purpose: increase insight  Name: Frederick Ballard Date of Birth: 1986-06-18  MR: [id number]    Level of Participation: active Quality of Participation: cooperative Interactions with others: gave feedback Mood/Affect: bright Triggers (if applicable): none Cognition: coherent/clear Progress: Moderate Response: I am thankful for respect. Plan: patient will be encouraged to keep focusing on the positive he is thankful for and to keep attending group.  Patients Problems:  Patient Active Problem List   Diagnosis Date Noted   Alcohol use disorder 03/25/2024   Substance induced mood disorder (HCC) 01/16/2021   Alcohol abuse 01/16/2021   Cocaine use disorder, moderate, in controlled environment (HCC) 01/16/2021   Polysubstance abuse (HCC) 07/24/2011   Schizoaffective disorder, depressive type (HCC) 07/24/2011

## 2024-05-11 DIAGNOSIS — F1020 Alcohol dependence, uncomplicated: Secondary | ICD-10-CM | POA: Diagnosis not present

## 2024-05-11 DIAGNOSIS — F14280 Cocaine dependence with cocaine-induced anxiety disorder: Secondary | ICD-10-CM | POA: Diagnosis not present

## 2024-05-11 MED ORDER — TRIPLE ANTIBIOTIC 3.5-400-5000 EX OINT: 1.0000 | TOPICAL_OINTMENT | Freq: Three times a day (TID) | CUTANEOUS | 0 refills | Status: AC | PRN

## 2024-05-11 MED ORDER — NICOTINE 14 MG/24HR TD PT24: 14.0000 mg | MEDICATED_PATCH | Freq: Every day | TRANSDERMAL | 0 refills | Status: AC

## 2024-05-11 MED ORDER — NALTREXONE HCL 50 MG PO TABS: 25.0000 mg | ORAL_TABLET | Freq: Every day | ORAL | 0 refills | Status: AC

## 2024-05-11 MED ORDER — TRAZODONE HCL 50 MG PO TABS: 50.0000 mg | ORAL_TABLET | Freq: Every day | ORAL | 0 refills | Status: AC

## 2024-05-11 MED ORDER — NALTREXONE HCL 50 MG PO TABS: 50.0000 mg | ORAL_TABLET | Freq: Every day | ORAL | 0 refills | Status: DC

## 2024-05-11 MED ORDER — ADULT MULTIVITAMIN W/MINERALS CH: 1.0000 | ORAL_TABLET | Freq: Every day | ORAL | 0 refills | Status: AC

## 2024-05-11 NOTE — ED Notes (Signed)
 Pt was observed in the dayroom when writer got to the unit. Pt denies SI/HI/AVH. Pt denies anxiety and depression. Q15 safety checks in place

## 2024-05-11 NOTE — ED Notes (Signed)
 Pt was provided lunch. Pt denies concerns for RN to address. Pt continues to present as isolative and guarded but polite and calm.

## 2024-05-11 NOTE — ED Notes (Signed)
 Pt sleeping at this moment. No acute distress noted. Respirations are even and labored. Q15 safety checks in place.

## 2024-05-11 NOTE — ED Notes (Signed)
 Pt presents as guarded, somewhat depressed, but cooperative and polite. Pt ate breakfast, denies physical pain and discomfort. Pt denies si hi and avh- verbal contract for safety provided. Pt says he feels 'fine'. Medications reviewed, questions denied.

## 2024-05-11 NOTE — Group Note (Signed)
 Group Topic: Wellness  Group Date: 05/11/2024 Start Time: 1200 End Time: 1230 Facilitators: Daved Tinnie HERO, RN  Department: [facility]  Number of Participants: 7  Group Focus: nursing group Treatment Modality:  Psychoeducation Interventions utilized were patient education Purpose: increase insight  Name: Frederick Ballard Date of Birth: [birth year]  MR: [id number]    Level of Participation: moderate Quality of Participation: cooperative Interactions with others: gave feedback Mood/Affect: appropriate Triggers (if applicable): n/a Cognition: coherent/clear Progress: Gaining insight Response: medications reviewed with pt, questions denied.  Plan: patient will be encouraged to attend RN education groups and discuss medications as desired.   Patients Problems:  Patient Active Problem List   Diagnosis Date Noted   Alcohol use disorder 03/25/2024   Substance induced mood disorder (HCC) 01/16/2021   Alcohol abuse 01/16/2021   Cocaine use disorder, moderate, in controlled environment (HCC) 01/16/2021   Polysubstance abuse (HCC) 07/24/2011   Schizoaffective disorder, depressive type (HCC) 07/24/2011

## 2024-05-11 NOTE — ED Notes (Signed)
 Pt denies concerns or complaints. Pt gets along well with peers. Pt ate dinner.

## 2024-05-11 NOTE — Group Note (Signed)
 Group Topic: Relapse and Recovery  Group Date: 05/11/2024 Start Time: 2000 End Time: 2100 Facilitators: Joan Plowman B  Department: [facility]  Number of Participants: 8  Group Focus: abuse issues and coping skills Treatment Modality:  Psychoeducation Interventions utilized were problem solving and support Purpose: express feelings and relapse prevention strategies  Name: Frederick Ballard Date of Birth: 17-May-1986  MR: [id number]    Level of Participation: active Quality of Participation: attentive and cooperative Interactions with others: gave feedback Mood/Affect: appropriate Triggers (if applicable): NA Cognition: coherent/clear Progress: Gaining insight Response: NA Plan: patient will be encouraged to keep going to groups  Patients Problems:  Patient Active Problem List   Diagnosis Date Noted   Alcohol use disorder 03/25/2024   Substance induced mood disorder (HCC) 01/16/2021   Alcohol abuse 01/16/2021   Cocaine use disorder, moderate, in controlled environment (HCC) 01/16/2021   Polysubstance abuse (HCC) 07/24/2011   Schizoaffective disorder, depressive type (HCC) 07/24/2011

## 2024-05-11 NOTE — Group Note (Signed)
 Group Topic: Understanding Self  Group Date: 05/11/2024 Start Time: 1300 End Time: 1330 Facilitators: Laneta Renea POUR, NT  Department: [facility]  Number of Participants: 2  Group Focus: coping skills and problem solving Treatment Modality:  Psychoeducation Interventions utilized were problem solving Purpose: enhance coping skills  Name: LEIBY PIGEON Date of Birth: 03/24/1986  MR: [id number]    Did not attend group.   Patients Problems:  Patient Active Problem List   Diagnosis Date Noted   Alcohol use disorder 03/25/2024   Substance induced mood disorder (HCC) 01/16/2021   Alcohol abuse 01/16/2021   Cocaine use disorder, moderate, in controlled environment (HCC) 01/16/2021   Polysubstance abuse (HCC) 07/24/2011   Schizoaffective disorder, depressive type (HCC) 07/24/2011

## 2024-05-11 NOTE — ED Provider Notes (Signed)
 Behavioral Health Progress Note  Date and Time: 05/11/2024 4:03 PM Name: Frederick Ballard MRN:  [id number]  HPI: Frederick Ballard is a 38 year old male who presented to [facility] behavioral health on 05/02/2024 requesting substance use treatment.  Current substances of choice include alcohol and cocaine.  Using alcohol and cocaine daily for 3 months prior to arrival.  Patient assessment, 05/11/2024: On assessment today, the pt reports that their mood is euthymic, improved since admission, and stable. Denies feeling down, depressed, or sad.  Reports that anxiety symptoms are at manageable level.  Sleep is stable. Appetite is stable.  Concentration is without complaint.  Energy level is adequate. Denies having any suicidal thoughts. Denies having any suicidal intent and plan.  Denies having any HI.  Denies having psychotic symptoms. Specifically denies AVH, denies paranoia, denies delusional thinking and there are no overt signs of psychosis. Denies having side effects to current psychiatric medications.  We discussed keeping medications the same with no changes to current medication regimen. Discussed Discharge for tomorrow the following program as per CSW:  [facility] patient has been accepted into their residential program  [facility]) [address]  [place], [place] [address] [phone]  Patient has an appointment scheduled for Thursday 05/12/24 @ 10:00 am.  Patient can discharge Thursday 05/12/2024 by 9:00 am  Scripts have been completed, medication reconciliation also completed and pharmacist has provided the 7 days samples of medications to nursing who will provide to patient at discharge. Patient denies any concerns with medications, denies concerns with discharge. States that he is excited about this program and verbalizes readiness to go so that he can regain his sobriety.  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  Substance-induced mood disorder, alcohol abuse, cocaine use disorder, suicidal ideation Past Medical History: None reported Family History: None reported Family Psychiatric  History: None reported Social History: Patient resides with his sister in [place], he is employed in the product/process development scientist, history of alcohol use disorder, cocaine use disorder ep: Good  Appetite:  Fair  Current Medications:  Current Facility-Administered Medications  Medication Dose Route Frequency Provider Last Rate Last Admin   acetaminophen  (TYLENOL ) tablet 650 mg  650 mg Oral Q6H PRN Brent, Amanda C, NP   650 mg at 05/09/24 0850   alum & mag hydroxide-simeth (MAALOX/MYLANTA) 200-200-20 MG/5ML suspension 30 mL  30 mL Oral Q4H PRN Brent, Amanda C, NP       haloperidol  (HALDOL ) tablet 5 mg  5 mg Oral TID PRN Brent, Amanda C, NP       And   diphenhydrAMINE  (BENADRYL ) capsule 50 mg  50 mg Oral TID PRN Brent, Amanda C, NP       haloperidol  lactate (HALDOL ) injection 10 mg  10 mg Intramuscular TID PRN Brent, Amanda C, NP       And   diphenhydrAMINE  (BENADRYL ) injection 50 mg  50 mg Intramuscular TID PRN Brent, Amanda C, NP       And   LORazepam  (ATIVAN ) injection 2 mg  2 mg Intramuscular TID PRN Brent, Amanda C, NP       haloperidol  lactate (HALDOL ) injection 5 mg  5 mg Intramuscular TID PRN Thresa Alan BROCKS, NP       And   diphenhydrAMINE  (BENADRYL ) injection 50 mg  50 mg Intramuscular TID PRN Thresa Alan  C, NP       And   LORazepam  (ATIVAN ) injection 2 mg  2 mg Intramuscular TID PRN Brent, Amanda C, NP       ibuprofen  (ADVIL ) tablet 600 mg  600 mg Oral Q6H PRN Brent, Amanda C, NP   600 mg at 05/10/24 1434   magnesium  hydroxide (MILK OF MAGNESIA) suspension 30 mL  30 mL Oral Daily PRN Brent, Amanda C, NP       multivitamin with minerals  tablet 1 tablet  1 tablet Oral Daily Brent, Amanda C, NP   1 tablet at 05/11/24 9075   naltrexone  (DEPADE) tablet 25 mg  25 mg Oral Daily Brent, Amanda C, NP   25 mg at 05/11/24 9075   neomycin-bacitracin-polymyxin [phone] OINT 1 Application  1 Application Topical TID PRN Cole Kandi BROCKS, MD       nicotine  (NICODERM CQ  - dosed in mg/24 hours) patch 14 mg  14 mg Transdermal Daily Brent, Amanda C, NP   14 mg at 05/11/24 9074   traZODone  (DESYREL ) tablet 50 mg  50 mg Oral QHS Brent, Amanda C, NP   50 mg at 05/10/24 2121   Current Outpatient Medications  Medication Sig Dispense Refill   naltrexone  (DEPADE) 50 MG tablet Take 0.5 tablets (25 mg total) by mouth daily. 15 tablet 0   ibuprofen  (ADVIL ) 200 MG tablet Take 600 mg by mouth every 6 (six) hours as needed (For pain).     [START ON 05/12/2024] Multiple Vitamin (MULTIVITAMIN WITH MINERALS) TABS tablet Take 1 tablet by mouth daily. 30 tablet 0   naltrexone  (DEPADE) 50 MG tablet Take 0.5 tablets (25 mg total) by mouth daily. 15 tablet 0   neomycin-bacitracin-polymyxin [phone] OINT Apply 1 Application topically 3 (three) times daily as needed (apply to affected area). 15 g 0   [START ON 05/12/2024] nicotine  (NICODERM CQ  - DOSED IN MG/24 HOURS) 14 mg/24hr patch Place 1 patch (14 mg total) onto the skin daily. 30 patch 0   traZODone  (DESYREL ) 50 MG tablet Take 1 tablet (50 mg total) by mouth at bedtime. 30 tablet 0    Labs  Lab Results:  Admission on 05/06/2024, Discharged on 05/06/2024  Component Date Value Ref Range Status   WBC 05/06/2024 10.3  4.0 - 10.5 K/uL Final   RBC 05/06/2024 5.03  4.22 - 5.81 MIL/uL Final   Hemoglobin 05/06/2024 15.1  13.0 - 17.0 g/dL Final   HCT 89/75/7974 44.9  39.0 - 52.0 % Final   MCV 05/06/2024 89.3  80.0 - 100.0 fL Final   MCH 05/06/2024 30.0  26.0 - 34.0 pg Final   MCHC 05/06/2024 33.6  30.0 - 36.0 g/dL Final   RDW 89/75/7974 13.0  11.5 - 15.5 % Final   Platelets 05/06/2024 337  150 - 400 K/uL  Final   nRBC 05/06/2024 0.0  0.0 - 0.2 % Final   Neutrophils Relative % 05/06/2024 46  % Final   Neutro Abs 05/06/2024 4.9  1.7 - 7.7 K/uL Final   Lymphocytes Relative 05/06/2024 42  % Final   Lymphs Abs 05/06/2024 4.3 (H)  0.7 - 4.0 K/uL Final   Monocytes Relative 05/06/2024 8  % Final   Monocytes Absolute 05/06/2024 0.9  0.1 - 1.0 K/uL Final   Eosinophils Relative 05/06/2024 2  % Final   Eosinophils Absolute 05/06/2024 0.2  0.0 - 0.5 K/uL Final   Basophils Relative 05/06/2024 1  % Final   Basophils Absolute 05/06/2024 0.1  0.0 - 0.1 K/uL Final  Immature Granulocytes 05/06/2024 1  % Final   Abs Immature Granulocytes 05/06/2024 0.05  0.00 - 0.07 K/uL Final   Performed at [facility], [address], [place], [place] [address]   Sodium 05/06/2024 137  135 - 145 mmol/L Final   Potassium 05/06/2024 4.5  3.5 - 5.1 mmol/L Final   Chloride 05/06/2024 105  98 - 111 mmol/L Final   CO2 05/06/2024 24  22 - 32 mmol/L Final   Glucose, Bld 05/06/2024 88  70 - 99 mg/dL Final   Glucose reference range applies only to samples taken after fasting for at least 8 hours.   BUN 05/06/2024 18  6 - 20 mg/dL Final   Creatinine, Ser 05/06/2024 1.06  0.61 - 1.24 mg/dL Final   Calcium 89/75/7974 9.4  8.9 - 10.3 mg/dL Final   Total Protein 89/75/7974 6.3 (L)  6.5 - 8.1 g/dL Final   Albumin 89/75/7974 3.8  3.5 - 5.0 g/dL Final   AST 89/75/7974 32  15 - 41 U/L Final   ALT 05/06/2024 35  0 - 44 U/L Final   Alkaline Phosphatase 05/06/2024 101  38 - 126 U/L Final   Total Bilirubin 05/06/2024 0.4  0.0 - 1.2 mg/dL Final   GFR, Estimated 05/06/2024 >60  >60 mL/min Final   Comment: (NOTE) Calculated using the CKD-EPI Creatinine Equation (2021)    Anion gap 05/06/2024 8  5 - 15 Final   Performed at [facility], [address], [place], [place] [address]   Magnesium  05/06/2024 2.1  1.7 - 2.4 mg/dL Final   Performed at [facility], [address] [place], [place] [address]   Alcohol, Ethyl (B)  05/06/2024 <15  <15 mg/dL Final   Comment: (NOTE) For medical purposes only. Performed at [facility], [address], [place], [place] [address]    TSH 05/06/2024 0.996  0.350 - 4.500 uIU/mL Final   Comment: Performed by a 3rd Generation assay with a functional sensitivity of <=0.01 uIU/mL. Performed at [facility], [address], [place], [place] [address]    POC Amphetamine UR 05/06/2024 None Detected  NONE DETECTED (Cut Off Level 1000 ng/mL) Final   POC Secobarbital (BAR) 05/06/2024 None Detected  NONE DETECTED (Cut Off Level 300 ng/mL) Final   POC Buprenorphine (BUP) 05/06/2024 None Detected  NONE DETECTED (Cut Off Level 10 ng/mL) Final   POC Oxazepam (BZO) 05/06/2024 None Detected  NONE DETECTED (Cut Off Level 300 ng/mL) Final   POC Cocaine UR 05/06/2024 Positive (A)  NONE DETECTED (Cut Off Level 300 ng/mL) Final   POC Methamphetamine UR 05/06/2024 None Detected  NONE DETECTED (Cut Off Level 1000 ng/mL) Final   POC Morphine 05/06/2024 None Detected  NONE DETECTED (Cut Off Level 300 ng/mL) Final   POC Methadone UR 05/06/2024 None Detected  NONE DETECTED (Cut Off Level 300 ng/mL) Final   POC Oxycodone UR 05/06/2024 None Detected  NONE DETECTED (Cut Off Level 100 ng/mL) Final   POC Marijuana UR 05/06/2024 Positive (A)  NONE DETECTED (Cut Off Level 50 ng/mL) Final  Admission on 03/25/2024, Discharged on 03/25/2024  Component Date Value Ref Range Status   WBC 03/25/2024 6.9  4.0 - 10.5 K/uL Final   RBC 03/25/2024 4.43  4.22 - 5.81 MIL/uL Final   Hemoglobin 03/25/2024 13.4  13.0 - 17.0 g/dL Final   HCT 90/87/7974 40.8  39.0 - 52.0 % Final   MCV 03/25/2024 92.1  80.0 - 100.0 fL Final  MCH 03/25/2024 30.2  26.0 - 34.0 pg Final   MCHC 03/25/2024 32.8  30.0 - 36.0 g/dL Final   RDW 90/87/7974 13.0  11.5 - 15.5 % Final   Platelets 03/25/2024 306  150 - 400 K/uL Final   nRBC 03/25/2024 0.0  0.0 - 0.2 % Final   Neutrophils Relative % 03/25/2024 36  % Final   Neutro Abs  03/25/2024 2.5  1.7 - 7.7 K/uL Final   Lymphocytes Relative 03/25/2024 48  % Final   Lymphs Abs 03/25/2024 3.3  0.7 - 4.0 K/uL Final   Monocytes Relative 03/25/2024 12  % Final   Monocytes Absolute 03/25/2024 0.8  0.1 - 1.0 K/uL Final   Eosinophils Relative 03/25/2024 3  % Final   Eosinophils Absolute 03/25/2024 0.2  0.0 - 0.5 K/uL Final   Basophils Relative 03/25/2024 1  % Final   Basophils Absolute 03/25/2024 0.1  0.0 - 0.1 K/uL Final   Immature Granulocytes 03/25/2024 0  % Final   Abs Immature Granulocytes 03/25/2024 0.02  0.00 - 0.07 K/uL Final   Performed at [facility], [address], [place], [place] [address]   Sodium 03/25/2024 137  135 - 145 mmol/L Final   Potassium 03/25/2024 4.3  3.5 - 5.1 mmol/L Final   Chloride 03/25/2024 103  98 - 111 mmol/L Final   CO2 03/25/2024 24  22 - 32 mmol/L Final   Glucose, Bld 03/25/2024 87  70 - 99 mg/dL Final   Glucose reference range applies only to samples taken after fasting for at least 8 hours.   BUN 03/25/2024 12  6 - 20 mg/dL Final   Creatinine, Ser 03/25/2024 1.12  0.61 - 1.24 mg/dL Final   Calcium 90/87/7974 9.5  8.9 - 10.3 mg/dL Final   Total Protein 90/87/7974 6.6  6.5 - 8.1 g/dL Final   Albumin 90/87/7974 3.8  3.5 - 5.0 g/dL Final   AST 90/87/7974 24  15 - 41 U/L Final   ALT 03/25/2024 25  0 - 44 U/L Final   Alkaline Phosphatase 03/25/2024 80  38 - 126 U/L Final   Total Bilirubin 03/25/2024 0.5  0.0 - 1.2 mg/dL Final   GFR, Estimated 03/25/2024 >60  >60 mL/min Final   Comment: (NOTE) Calculated using the CKD-EPI Creatinine Equation (2021)    Anion gap 03/25/2024 10  5 - 15 Final   Performed at [facility], [address], [place], [place] [address]   Hgb A1c MFr Bld 03/25/2024 5.2  4.8 - 5.6 % Final   Comment: (NOTE) Diagnosis of Diabetes The following HbA1c ranges recommended by the American Diabetes Association (ADA) may be used as an aid in the diagnosis of diabetes mellitus.  Hemoglobin              Suggested A1C NGSP%              Diagnosis  <5.7                   Non Diabetic  5.7-6.4                Pre-Diabetic  >6.4                   Diabetic  <7.0                   Glycemic control for  adults with diabetes.     Mean Plasma Glucose 03/25/2024 102.54  mg/dL Final   Performed at [facility], [address] [place], [place] [address]   Magnesium  03/25/2024 1.9  1.7 - 2.4 mg/dL Final   Performed at [facility], [address], [place], [place] [address]   Alcohol, Ethyl (B) 03/25/2024 <15  <15 mg/dL Final   Comment: (NOTE) For medical purposes only. Performed at [facility], [address] [place], [place] [address]    Cholesterol 03/25/2024 180  0 - 200 mg/dL Final   Triglycerides 90/87/7974 171 (H)  <150 mg/dL Final   HDL 90/87/7974 45  >40 mg/dL Final   Total CHOL/HDL Ratio 03/25/2024 4.0  RATIO Final   VLDL 03/25/2024 34  0 - 40 mg/dL Final   LDL Cholesterol 03/25/2024 101 (H)  0 - 99 mg/dL Final   Comment:        Total Cholesterol/HDL:CHD Risk Coronary Heart Disease Risk Table                     Men   Women  1/2 Average Risk   3.4   3.3  Average Risk       5.0   4.4  2 X Average Risk   9.6   7.1  3 X Average Risk  23.4   11.0        Use the calculated Patient Ratio above and the CHD Risk Table to determine the patient's CHD Risk.        ATP III CLASSIFICATION (LDL):  <100     mg/dL   Optimal  899-870  mg/dL   Near or Above                    Optimal  130-159  mg/dL   Borderline  839-810  mg/dL   High  >809     mg/dL   Very High Performed at [facility], [address], [place], [place] [address]    TSH 03/25/2024 1.365  0.350 - 4.500 uIU/mL Final   Comment: Performed by a 3rd Generation assay with a functional sensitivity of <=0.01 uIU/mL. Performed at [facility], [address] [place], [place] [address]    POC Amphetamine UR 03/25/2024 None Detected  NONE DETECTED (Cut Off Level 1000  ng/mL) Final   POC Secobarbital (BAR) 03/25/2024 None Detected  NONE DETECTED (Cut Off Level 300 ng/mL) Final   POC Buprenorphine (BUP) 03/25/2024 None Detected  NONE DETECTED (Cut Off Level 10 ng/mL) Final   POC Oxazepam (BZO) 03/25/2024 None Detected  NONE DETECTED (Cut Off Level 300 ng/mL) Final   POC Cocaine UR 03/25/2024 Positive (A)  NONE DETECTED (Cut Off Level 300 ng/mL) Final   POC Methamphetamine UR 03/25/2024 None Detected  NONE DETECTED (Cut Off Level 1000 ng/mL) Final   POC Morphine 03/25/2024 None Detected  NONE DETECTED (Cut Off Level 300 ng/mL) Final   POC Methadone UR 03/25/2024 None Detected  NONE DETECTED (Cut Off Level 300 ng/mL) Final   POC Oxycodone UR 03/25/2024 None Detected  NONE DETECTED (Cut Off Level 100 ng/mL) Final   POC Marijuana UR 03/25/2024 None Detected  NONE DETECTED (Cut Off Level 50 ng/mL) Final  Office Visit on 03/09/2024  Component Date Value Ref Range Status   Neisseria Gonorrhea 03/09/2024 Negative   Final   Chlamydia 03/09/2024 Negative   Final   Trichomonas 03/09/2024 Negative   Final  Comment 03/09/2024 Normal Reference Range Trichomonas - Negative   Final   Comment 03/09/2024 Normal Reference Ranger Chlamydia - Negative   Final   Comment 03/09/2024 Normal Reference Range Neisseria Gonorrhea - Negative   Final   HIV Screen 4th Generation wRfx 03/09/2024 Non Reactive  Non Reactive Final   Comment: HIV-1/HIV-2 antibodies and HIV-1 p24 antigen were NOT detected. There is no laboratory evidence of HIV infection. HIV Negative    RPR Ser Ql 03/09/2024 Non Reactive  Non Reactive Final    Blood Alcohol level:  Lab Results  Component Value Date   [facility] <15 05/06/2024   ETH <15 03/25/2024    Metabolic Disorder Labs: Lab Results  Component Value Date   HGBA1C 5.2 03/25/2024   MPG 102.54 03/25/2024   MPG 111 03/27/2023   No results found for: PROLACTIN Lab Results  Component Value Date   CHOL 180 03/25/2024   TRIG 171 (H) 03/25/2024   HDL  45 03/25/2024   CHOLHDL 4.0 03/25/2024   VLDL 34 03/25/2024   LDLCALC 101 (H) 03/25/2024   LDLCALC 118 (H) 03/27/2023    Therapeutic Lab Levels: No results found for: LITHIUM No results found for: VALPROATE No results found for: CBMZ  Physical Findings   AUDIT    Flowsheet Row ED from 08/04/2023 in [facility]  Alcohol Use Disorder Identification Test Final Score (AUDIT) 24   PHQ2-9    Flowsheet Row ED from 05/06/2024 in [facility] Most recent reading at 05/09/2024  8:29 AM ED from 03/25/2024 in [facility] Most recent reading at 03/25/2024  4:32 PM ED from 08/04/2023 in [facility] Most recent reading at 08/07/2023  8:11 AM ED from 03/27/2023 in [facility] Most recent reading at 03/31/2023 10:18 AM ED from 03/27/2023 in [facility] Most recent reading at 03/27/2023 11:04 AM  PHQ-2 Total Score 3 4 0 3 3  PHQ-9 Total Score 12 14 -- 12 13   Flowsheet Row ED from 05/06/2024 in [facility] Most recent reading at 05/10/2024 10:26 PM ED from 05/06/2024 in [facility] Most recent reading at 05/06/2024  4:45 PM ED from 04/24/2024 in [facility] Emergency Department at [facility] Most recent reading at 04/24/2024  5:36 PM  C-SSRS RISK CATEGORY No Risk Error: Q3, 4, or 5 should not be populated when Q2 is No No Risk     Musculoskeletal  Strength & Muscle Tone: within normal limits Gait & Station: normal Patient leans: N/A  Psychiatric Specialty Exam  Presentation  General Appearance:  Casual  Eye Contact: Fair  Speech: Clear and Coherent  Speech Volume: Normal  Handedness: Right   Mood and Affect  Mood: Euthymic  Affect: Congruent   Thought Process  Thought Processes: Coherent  Descriptions of  Associations:Intact  Orientation:Full (Time, Place and Person)  Thought Content:Logical  Diagnosis of Schizophrenia or Schizoaffective disorder in past: No    Hallucinations:Hallucinations: None  Ideas of Reference:None  Suicidal Thoughts:Suicidal Thoughts: No  Homicidal Thoughts:Homicidal Thoughts: No   Sensorium  Memory: Immediate Good  Judgment: Good  Insight: Good   Executive Functions  Concentration: Good  Attention Span: Good  Recall: Good  Fund of Knowledge: Good  Language: Good   Psychomotor Activity  Psychomotor Activity:Psychomotor Activity: Normal   Assets  Assets: Communication Skills   Sleep  Sleep:Sleep: Good  Estimated Sleeping Duration (Last 24 Hours):  8.50-10.75 hours  Nutritional Assessment (For OBS and FBC admissions only) Has the patient had a weight loss or gain of 10 pounds or more in the last 3 months?: No Has the patient had a decrease in food intake/or appetite?: No Does the patient have dental problems?: No Does the patient have eating habits or behaviors that may be indicators of an eating disorder including binging or inducing vomiting?: No Has the patient recently lost weight without trying?: 0 Has the patient been eating poorly because of a decreased appetite?: 0 Malnutrition Screening Tool Score: 0    Physical Exam  Physical Exam Vitals and nursing note reviewed.  Constitutional:      Appearance: Normal appearance. He is well-developed.  HENT:     Head: Normocephalic and atraumatic.     Nose: Nose normal.  Cardiovascular:     Rate and Rhythm: Normal rate.  Pulmonary:     Effort: Pulmonary effort is normal.  Musculoskeletal:        General: Normal range of motion.     Cervical back: Normal range of motion.  Skin:    General: Skin is warm and dry.  Neurological:     Mental Status: He is alert and oriented to person, place, and time.  Psychiatric:        Attention and Perception: Attention and  perception normal.        Mood and Affect: Mood and affect normal.        Speech: Speech normal.        Behavior: Behavior normal. Behavior is cooperative.        Thought Content: Thought content normal.        Cognition and Memory: Cognition and memory normal.        Judgment: Judgment normal.    Review of Systems  Constitutional: Negative.   HENT: Negative.    Eyes: Negative.   Respiratory: Negative.    Cardiovascular: Negative.   Gastrointestinal: Negative.   Genitourinary: Negative.   Musculoskeletal: Negative.   Skin: Negative.   Neurological: Negative.   Psychiatric/Behavioral:  Positive for depression and substance abuse. Negative for hallucinations, memory loss and suicidal ideas. The patient is nervous/anxious. The patient does not have insomnia.   All other systems reviewed and are negative.  Blood pressure (!) 114/91, pulse 68, temperature 97.6 F (36.4 C), resp. rate 17, SpO2 100%. There is no height or weight on file to calculate BMI.  Treatment Plan Summary: Leaving for [facility] tomorrow, 10/30 with a 7 day supply of medication samples and a 30 day supply of scripts. Verbalizes readiness for discharge.  Donia Snell, NP 05/11/2024 4:03 PM

## 2024-05-11 NOTE — ED Notes (Signed)
 Pt is sleeping at this time, no acute distress noted.

## 2024-05-12 DIAGNOSIS — F1020 Alcohol dependence, uncomplicated: Secondary | ICD-10-CM | POA: Diagnosis not present

## 2024-05-12 DIAGNOSIS — F14280 Cocaine dependence with cocaine-induced anxiety disorder: Secondary | ICD-10-CM | POA: Diagnosis not present

## 2024-05-12 NOTE — ED Notes (Signed)
 Patient A&O x 4, ambulatory. Patient discharged in no acute distress. Patient denied SI/HI, A/VH upon discharge. Patient verbalized understanding of all discharge instructions reviewed on AVS via staff, to include follow up appointments, RX's and safety. Suicide safety plan completed and reviewed with clinical research associate. A copy given to pt. Patient reported mood 10/10.  Pt belongings returned to patient from locker #14 intact. Patient escorted to [place] via staff for transport to [facility] via safe transport. Safety maintained.

## 2024-05-12 NOTE — ED Notes (Signed)
 Patient A&Ox4. Denies intent to harm self/others when asked. Denies A/VH. Patient denies any physical complaints when asked. No acute distress noted. Support and encouragement provided. Routine safety checks conducted according to facility protocol. Encouraged patient to notify staff if thoughts of harm toward self or others arise. Patient verbalize understanding and agreement. Will continue to monitor for safety.

## 2024-05-12 NOTE — ED Notes (Signed)
 Pt is sleeping at this time, no acute distress noted. Respirations are even and unlabored. Q15 safety checks in place.

## 2024-05-12 NOTE — ED Notes (Signed)
 Pt is sleeping, no acute distress noted.
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
## 2024-05-12 NOTE — Group Note (Signed)
 Group Topic: Overcoming Obstacles  Group Date: 05/12/2024 Start Time: 0920 End Time: 0933 Facilitators: Herold Lajuana NOVAK, RN  Department: [facility]  Number of Participants: 1  Group Focus: discharge education Treatment Modality:  Individual Therapy Interventions utilized were patient education Purpose: regain self-worth  Name: Frederick Ballard Date of Birth: [birth year]  MR: [id number]    Level of Participation: active Quality of Participation: attention seeking Interactions with others: gave feedback Mood/Affect: appropriate Triggers (if applicable): none identified Cognition: coherent/clear Progress: Gaining insight Response: pt verbalized understanding of safety interventions on AVS Plan: patient will be encouraged to stay committed to tx program  Patients Problems:  Patient Active Problem List   Diagnosis Date Noted   Alcohol use disorder 03/25/2024   Substance induced mood disorder (HCC) 01/16/2021   Alcohol abuse 01/16/2021   Cocaine use disorder, moderate, in controlled environment (HCC) 01/16/2021   Polysubstance abuse (HCC) 07/24/2011   Schizoaffective disorder, depressive type (HCC) 07/24/2011

## 2024-05-16 ENCOUNTER — Ambulatory Visit: Payer: Self-pay

## 2024-05-16 ENCOUNTER — Telehealth: Payer: Self-pay | Admitting: General Practice

## 2024-05-16 NOTE — Telephone Encounter (Signed)
 Called pt to reschedule missed appt; could not reach or leave vm

## 2024-05-23 ENCOUNTER — Ambulatory Visit (HOSPITAL_COMMUNITY): Admission: EM | Admit: 2024-05-23 | Discharge: 2024-05-23 | Disposition: A | Discharge status: Home / Self Care

## 2024-05-23 DIAGNOSIS — Z5189 Encounter for other specified aftercare: Secondary | ICD-10-CM

## 2024-05-23 DIAGNOSIS — F1920 Other psychoactive substance dependence, uncomplicated: Secondary | ICD-10-CM | POA: Insufficient documentation

## 2024-05-23 NOTE — Discharge Instructions (Addendum)
 .. Substance Abuse Resources     SUBSTANCE USE TREATMENT for Medicaid and State Funded/IPRS  [facility] [address] [place], [place], [address] [phone] phone NOTE: [facility] is no longer offering IOP services.  Serves those who are low-income or have no insurance.  [facility] [address] [place], [place], [address] [phone] phone [phone] fax NOTE: Does have Substance Abuse-Intensive Outpatient Program [facility]) as well as transitional housing if eligible.  [facility] [address] [place], [place], [address] [phone] phone [phone] fax  [facility] [phone] [address] [place], [place], [address] [phone] phone [phone] fax    HALFWAY HOUSES:  [facility] [phone]  [email or web address]     12 STEP PROGRAMS:  Alcoholics Anonymous of [place] softwarechalet.be  Narcotics Anonymous of [place] hitprotect.dk  Al-Anon of [place], [place] www.[place]alanon.org/find-meetings.html  Nar-Anon https://nar-anon.org/find-a-meetin      List of Residential placements:   [facility] in [place]: [phone]  [facility]: [phone]  [place], [place] [phone]: Male and male facility; 30-day program: (uninsured and Medicaid such as [place], [place], [place], partners)  [facility]: [phone]; men and women's facility; 28 days; Can have Medicaid tailored plan Tour Manager or Partners)  [facility]: [phone] [place] or [place]; 28 day program; must be fully detox; tailored Medicaid or no insurance  [address] [place], [place]; [phone]; 28 day all males program; no insurance accepted  [facility] in [place]: [place] [phone] (no insurance or Medicaid only); 90 days; outpatient services but provide housing in apartments  [place]  [facility] Admission: [phone]: Patient must complete phone screening for placement: [place], [place]; 6 month program; uninsured, Medicaid, and [employer] southern financial.   [facility]: no insurance required; [phone]  [facility]: [phone]; Intake: [place]; Must fill out application online; Steffan Delay [phone] x [phone] [facility] in [place], [place]: [phone]; Admissions Coordinators Mr. Marinda or Alm Eagles; 90 day program.  [facility]: [place], [place] [phone]; Co-Ed 9 month to a year program; Online application; Men entry fee is $500 (6-62months);  [facility]: [address] 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, [place]: [employer] Desk Staff: [place] [phone]: They have a Men's Regenerations Program 6-539months. Free program; There is an initial $300 fee however, they are willing to work with patients regarding that. Application is online.  [facility]: Admissions [phone] [place] [phone] ext [phone]; Any 7-90 day program is out of pocket; 12 month program is free of charge; there is a $275 entry fee; Patient is responsible for own transportation   Get help right away if: You have thoughts about hurting yourself or others. Get help right away if you feel like you may hurt yourself or others, or have thoughts about taking your own life. Go to your nearest emergency room or: Call 911. Call the National Suicide Prevention Lifeline at 818-143-7691 or 988 in the [place] This is open 24 hours a  day. If you're a Veteran: Call 988 and press 1. This is open 24 hours a day. Text the [employer] at (228) 779-1613.  Summary Mental health is not just the absence of mental illness. It involves understanding your emotions and behaviors, and taking steps to manage them in a healthy way. If you have symptoms of mental or emotional distress, get help from family, friends, a health care provider, or a mental health professional. Practice good mental health behaviors such as stress management skills, self-calming skills, exercise, healthy sleeping and eating, and supportive relationships. This information is not intended to replace advice given to you by your health care provider. Make sure you discuss any questions you have with your health care provider.    Education provided on the fact that if experiencing worsening of psychiatry symptoms including suicidal ideations, homicidal ideations, or having auditory/visual hallucinations, etc, to call 911, 988, come back to this location, or go to the nearest ER. Pt verbalized understanding.
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
## 2024-05-23 NOTE — ED Provider Notes (Signed)
 Behavioral Health Urgent Care Medical Screening Exam  Patient Name: Frederick Ballard MRN: [id number] Date of Evaluation: 05/23/24 Chief Complaint:  I did not get the help I needed Diagnosis:  Final diagnoses:  Polysubstance dependence (HCC)  Encounter for rehabilitation    History of Present illness: Frederick Ballard 38 y.o., male patient presented to [facility] as a voluntary walk in unaccompanied with complaints of I did not get the help I needed. Patient was recently discharged from the [facility] on 10/30 to [facility] for long-term rehabilitation for substance use. However, he reports that after one week, he was terminated from the program due to insurance issues. He states that he had to call his family to pick him up. The patient reports that he does not currently have a psychiatrist or therapist and is not taking any psychiatric medications. He is currently denying suicidal ideation, homicidal ideation, auditory hallucinations, and visual hallucinations or paranoia.   Frederick Ballard, is seen face to face by this provider, consulted with Dr. Lawrnce; and chart reviewed on 05/23/24.  On evaluation Frederick Ballard reports currently living with his sister. He denies access to weapons or firearms. 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 Since leaving [facility], he reports using cocaine--less than half a gram, once, last night--and consuming alcohol on Saturday and Sunday nights, approximately 16 ounces, due to frustration about having to leave the program early. Patient expresses a need for assistance in entering a long-term treatment program. He is denying SI/HI/A/VH  and will be provided with resources for [facility].  During evaluation Frederick Ballard is sitting in an upright position in no acute distress.  He is alert & oriented x 4, calm, cooperative and attentive for this assessment.  His mood is anxious with congruent affect. He has normal speech, and behavior.   Objectively there is no evidence of psychosis/mania or delusional thinking. Pt does not appear to be responding to internal or external stimuli.  Patient is able to converse coherently, goal directed thoughts, no distractibility, or pre-occupation.  He also denies suicidal/self-harm/homicidal ideation, psychosis, and paranoia.  Patient answered question appropriately.     Flowsheet Row ED from 05/23/2024 in [facility] Most recent reading at 05/23/2024 11:41 AM ED from 05/06/2024 in [facility] Most recent reading at 05/10/2024 10:26 PM ED from 05/06/2024 in [facility] Most recent reading at 05/06/2024  4:45 PM  C-SSRS RISK CATEGORY Error: Q3, 4, or 5 should not be populated when Q2 is No No Risk Error: Q3, 4, or 5 should not be populated when Q2 is No    Psychiatric Specialty Exam  Presentation  General Appearance:Appropriate for Environment  Eye Contact:Good  Speech:Normal Rate  Speech Volume:Normal  Handedness:Right   Mood and Affect  Mood: Anxious  Affect: Congruent   Thought Process  Thought Processes: Coherent; Goal Directed  Descriptions of Associations:Intact  Orientation:Full (Time, Place and Person)  Thought Content:WDL  Diagnosis of Schizophrenia or Schizoaffective disorder in past: No   Hallucinations:None  Ideas of Reference:None  Suicidal Thoughts:No  Homicidal Thoughts:No   Sensorium  Memory: Immediate Fair  Judgment: Good  Insight: Good   Executive Functions  Concentration: Good  Attention Span: Fair  Recall: Fair  Fund of Knowledge: Fair  Language: Good   Psychomotor Activity  Psychomotor Activity: Normal   Assets  Assets: Communication Skills; Desire for Improvement; Housing; Social Support   Sleep  Sleep: Good  Number of hours:  5   Physical Exam: Physical Exam Vitals reviewed.  Constitutional:      Appearance:  Normal appearance.  HENT:     Head: Normocephalic and atraumatic.     Nose: Nose normal.     Mouth/Throat:     Pharynx: Oropharynx is clear.  Cardiovascular:     Rate and Rhythm: Normal rate.  Pulmonary:     Effort: Pulmonary effort is normal.  Musculoskeletal:        General: Normal range of motion.     Cervical back: Normal range of motion.  Neurological:     Mental Status: He is alert and oriented to person, place, and time.  Psychiatric:        Attention and Perception: Attention and perception normal.        Mood and Affect: Mood is anxious.        Speech: Speech normal.        Behavior: Behavior normal. Behavior is cooperative.        Thought Content: Thought content normal.        Cognition and Memory: Cognition normal.        Judgment: Judgment normal.    Review of Systems  Psychiatric/Behavioral:  Positive for depression and substance abuse. The patient is nervous/anxious.   All other systems reviewed and are negative.  Blood pressure 124/75, pulse 67, temperature 97.8 F (36.6 C), resp. rate 16, SpO2 100%. There is no height or weight on file to calculate BMI.  Musculoskeletal: Strength & Muscle Tone: within normal limits Gait & Station: normal Patient leans: N/A   [facility] MSE Discharge Disposition for Follow up and Recommendations: Based on my evaluation the patient does not appear to have an emergency medical condition and can be discharged with resources and follow up care in outpatient services for Medication Management and Substance Abuse Intensive Outpatient Program    Substance Abuse Resources     SUBSTANCE USE TREATMENT for Medicaid and State Funded/IPRS  [facility] [address], [place], [address] [phone] phone NOTE: [facility] is no longer offering IOP services.  Serves those who are low-income or have no insurance.  [facility] [address], [place], [place], [address] [phone] phone [phone] fax NOTE:  Does have Substance Abuse-Intensive Outpatient Program [facility]) as well as transitional housing if eligible.  [facility] [address] [place], [place], [address] [phone] phone [phone] fax  [facility] [phone] [address] [place], [place], [address] [phone] phone [phone] fax    HALFWAY HOUSES:  [facility] [phone]  [email or web address]     12 STEP PROGRAMS:  Alcoholics Anonymous of [place] softwarechalet.be  Narcotics Anonymous of [place] hitprotect.dk  Al-Anon of [place], [place] www.[place]alanon.org/find-meetings.html  Nar-Anon https://nar-anon.org/find-a-meetin      List of Residential placements:   [facility] in [place]: [phone]  [facility]: [phone]  [place], [place] [phone]: Male and male facility; 30-day program: (uninsured and Medicaid such as [place], [place], [place], partners)  [facility]: [phone]; men and women's facility; 28 days; Can have Medicaid tailored plan Tour Manager or Partners)  [facility]: [phone] [place] or [place]; 28 day program; must be fully detox; tailored Medicaid or no insurance  [address] in [place], [place]; [phone]; 28 day all males program; no insurance accepted  [facility] in [place]: [place] [phone] (no insurance or Medicaid only); 90 days; outpatient services but  provide housing in apartments [place]  [facility] Admission: [phone]: Patient must complete phone screening for placement: [place], [place]; 6 month program; uninsured, Medicaid, and [employer] financial.   [facility]: no insurance required; [phone]  [facility]: [phone]; Intake: [place]; Must fill out application online; Steffan Delay [phone] x [address] [facility] in  [place], [place]: [phone]; Admissions Coordinators Mr. Marinda or Alm Eagles; 90 day program.  [facility]: [place], [place] [phone]; Co-Ed 9 month to a year program; Online application; Men entry fee is $500 (6-44months);  [facility]: [address] [place], [place] [address]; no fee or insurance required; minimum of 2 years; Highly structured; work based; Intake Coordinator is [place] [phone]  [facility] in [place], [place]: [phone]; Fax number is [phone]; website: [email or web address].org; Requires 3-6 page autobiography; 2 year program (18 months and then 89month transitional housing); Admission fee is $300; no insurance needed; work Automotive Engineer in [place], [place]: [employer] Desk Staff: [place] [phone]: They have a Men's Regenerations Program 6-14months. Free program; There is an initial $300 fee however, they are willing to work with patients regarding that. Application is online.  [facility]: Admissions [phone] [place] [phone] ext [phone]; Any 7-90 day program is out of pocket; 12 month program is free of charge; there is a $275 entry fee; Patient is responsible for own transportation   Get help right away if: You have thoughts about hurting yourself or others. Get help right away if you feel like you may hurt yourself or others, or have thoughts about taking your own life. Go to your nearest emergency room or: Call 911. Call the National Suicide Prevention Lifeline at 630-297-4172 or 988 in the [place] This is open 24 hours a day. If you're a Veteran: Call 988 and press 1. This is open 24 hours a day. Text the [employer] at (414)132-9535. Summary Mental health is not just the absence of mental illness. It involves understanding your emotions and behaviors, and taking steps to manage them in a healthy way. If you have symptoms of mental or emotional distress, get help from family, friends, a health care provider, or  a mental health professional. Practice good mental health behaviors such as stress management skills, self-calming skills, exercise, healthy sleeping and eating, and supportive relationships. This information is not intended to replace advice given to you by your health care provider. Make sure you discuss any questions you have with your health care provider.   Education provided on the fact that if experiencing worsening of psychiatry symptoms including suicidal ideations, homicidal ideations, or having auditory/visual hallucinations, etc, to call 911, 988, come back to this location, or go to the nearest ER. Pt verbalized understanding.   Tosin Nakhia Levitan, NP 05/23/2024, 12:30 PM

## 2024-05-23 NOTE — ED Notes (Signed)
 Patient Is discharging at this time. Printed AVS reviewed with patient along with follow up appointments and resources. Valuables/belongings returned to patient. No s/s of current distress.
